# Patient Record
Sex: Female | Born: 1985 | Race: Black or African American | Hispanic: No | Smoking: Never smoker
Health system: Southern US, Community
[De-identification: ages and names within clinical notes are randomized; demographics above are authoritative.]

## PROBLEM LIST (undated history)

## (undated) DIAGNOSIS — F419 Anxiety disorder, unspecified: Secondary | ICD-10-CM

## (undated) DIAGNOSIS — K8020 Calculus of gallbladder without cholecystitis without obstruction: Secondary | ICD-10-CM

## (undated) DIAGNOSIS — E0590 Thyrotoxicosis, unspecified without thyrotoxic crisis or storm: Secondary | ICD-10-CM

## (undated) DIAGNOSIS — T7840XA Allergy, unspecified, initial encounter: Secondary | ICD-10-CM

## (undated) HISTORY — DX: Anxiety disorder, unspecified: F41.9

## (undated) HISTORY — DX: Allergy, unspecified, initial encounter: T78.40XA

## (undated) HISTORY — PX: TONSILLECTOMY: SUR1361

## (undated) HISTORY — DX: Calculus of gallbladder without cholecystitis without obstruction: K80.20

## (undated) HISTORY — PX: OTHER SURGICAL HISTORY: SHX169

## (undated) HISTORY — DX: Thyrotoxicosis, unspecified without thyrotoxic crisis or storm: E05.90

## (undated) HISTORY — PX: ADENOIDECTOMY: SUR15

---
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
## 2005-06-17 ENCOUNTER — Emergency Department (HOSPITAL_COMMUNITY): Admission: EM | Admit: 2005-06-17 | Discharge: 2005-06-18 | Payer: Self-pay | Admitting: Emergency Medicine

## 2005-06-23 ENCOUNTER — Other Ambulatory Visit: Admission: RE | Admit: 2005-06-23 | Discharge: 2005-06-23 | Payer: Self-pay | Admitting: Obstetrics and Gynecology

## 2005-07-12 ENCOUNTER — Inpatient Hospital Stay (HOSPITAL_COMMUNITY): Admission: AD | Admit: 2005-07-12 | Discharge: 2005-07-12 | Payer: Self-pay | Admitting: Obstetrics and Gynecology

## 2005-07-16 ENCOUNTER — Other Ambulatory Visit: Admission: RE | Admit: 2005-07-16 | Discharge: 2005-07-16 | Payer: Self-pay | Admitting: Obstetrics and Gynecology

## 2005-09-24 ENCOUNTER — Inpatient Hospital Stay (HOSPITAL_COMMUNITY): Admission: AD | Admit: 2005-09-24 | Discharge: 2005-09-24 | Payer: Self-pay | Admitting: Obstetrics and Gynecology

## 2005-09-28 ENCOUNTER — Encounter (INDEPENDENT_AMBULATORY_CARE_PROVIDER_SITE_OTHER): Payer: Self-pay | Admitting: Specialist

## 2005-09-28 ENCOUNTER — Inpatient Hospital Stay (HOSPITAL_COMMUNITY): Admission: RE | Admit: 2005-09-28 | Discharge: 2005-10-01 | Payer: Self-pay | Admitting: Obstetrics and Gynecology

## 2005-10-02 ENCOUNTER — Encounter: Admission: RE | Admit: 2005-10-02 | Discharge: 2005-10-16 | Payer: Self-pay | Admitting: Obstetrics and Gynecology

## 2005-11-11 ENCOUNTER — Other Ambulatory Visit: Admission: RE | Admit: 2005-11-11 | Discharge: 2005-11-11 | Payer: Self-pay | Admitting: Obstetrics and Gynecology

## 2006-01-21 ENCOUNTER — Other Ambulatory Visit: Admission: RE | Admit: 2006-01-21 | Discharge: 2006-01-21 | Payer: Self-pay | Admitting: Obstetrics and Gynecology

## 2007-04-24 ENCOUNTER — Inpatient Hospital Stay (HOSPITAL_COMMUNITY): Admission: AD | Admit: 2007-04-24 | Discharge: 2007-04-24 | Payer: Self-pay | Admitting: Obstetrics and Gynecology

## 2007-05-23 ENCOUNTER — Inpatient Hospital Stay (HOSPITAL_COMMUNITY): Admission: AD | Admit: 2007-05-23 | Discharge: 2007-05-24 | Payer: Self-pay | Admitting: Obstetrics & Gynecology

## 2007-09-04 ENCOUNTER — Inpatient Hospital Stay (HOSPITAL_COMMUNITY): Admission: AD | Admit: 2007-09-04 | Discharge: 2007-09-04 | Payer: Self-pay | Admitting: Obstetrics

## 2008-07-07 ENCOUNTER — Emergency Department (HOSPITAL_COMMUNITY): Admission: EM | Admit: 2008-07-07 | Discharge: 2008-07-07 | Payer: Self-pay | Admitting: Emergency Medicine

## 2008-10-07 ENCOUNTER — Emergency Department (HOSPITAL_COMMUNITY): Admission: EM | Admit: 2008-10-07 | Discharge: 2008-10-07 | Payer: Self-pay | Admitting: Emergency Medicine

## 2009-02-21 ENCOUNTER — Emergency Department (HOSPITAL_COMMUNITY): Admission: EM | Admit: 2009-02-21 | Discharge: 2009-02-22 | Payer: Self-pay | Admitting: Emergency Medicine

## 2009-07-19 ENCOUNTER — Emergency Department (HOSPITAL_COMMUNITY): Admission: EM | Admit: 2009-07-19 | Discharge: 2009-07-19 | Payer: Self-pay | Admitting: Emergency Medicine

## 2009-09-12 ENCOUNTER — Emergency Department (HOSPITAL_COMMUNITY): Admission: EM | Admit: 2009-09-12 | Discharge: 2009-09-12 | Payer: Self-pay | Admitting: Emergency Medicine

## 2009-10-29 ENCOUNTER — Emergency Department (HOSPITAL_COMMUNITY): Admission: EM | Admit: 2009-10-29 | Discharge: 2009-10-29 | Payer: Self-pay | Admitting: Emergency Medicine

## 2010-01-28 ENCOUNTER — Ambulatory Visit (HOSPITAL_BASED_OUTPATIENT_CLINIC_OR_DEPARTMENT_OTHER): Admission: RE | Admit: 2010-01-28 | Discharge: 2010-01-28 | Payer: Self-pay | Admitting: Otolaryngology

## 2010-06-14 ENCOUNTER — Emergency Department (HOSPITAL_BASED_OUTPATIENT_CLINIC_OR_DEPARTMENT_OTHER)
Admission: EM | Admit: 2010-06-14 | Discharge: 2010-06-14 | Payer: Self-pay | Source: Home / Self Care | Admitting: Emergency Medicine

## 2010-12-25 LAB — RAPID STREP SCREEN (MED CTR MEBANE ONLY): Streptococcus, Group A Screen (Direct): POSITIVE — AB

## 2010-12-28 LAB — RAPID STREP SCREEN (MED CTR MEBANE ONLY): Streptococcus, Group A Screen (Direct): NEGATIVE

## 2010-12-30 LAB — POCT HEMOGLOBIN-HEMACUE: Hemoglobin: 12.3 g/dL (ref 12.0–15.0)

## 2011-01-15 LAB — RAPID STREP SCREEN (MED CTR MEBANE ONLY): Streptococcus, Group A Screen (Direct): NEGATIVE

## 2011-01-20 LAB — POCT I-STAT, CHEM 8
BUN: 10 mg/dL (ref 6–23)
Calcium, Ion: 1.22 mmol/L (ref 1.12–1.32)
Chloride: 105 mEq/L (ref 96–112)
Creatinine, Ser: 0.9 mg/dL (ref 0.4–1.2)
Glucose, Bld: 79 mg/dL (ref 70–99)
HCT: 40 % (ref 36.0–46.0)
Hemoglobin: 13.6 g/dL (ref 12.0–15.0)
Potassium: 3.9 mEq/L (ref 3.5–5.1)
Sodium: 139 mEq/L (ref 135–145)
TCO2: 27 mmol/L (ref 0–100)

## 2011-01-20 LAB — CBC
HCT: 37 % (ref 36.0–46.0)
Hemoglobin: 12.1 g/dL (ref 12.0–15.0)
MCHC: 32.6 g/dL (ref 30.0–36.0)
MCV: 82.5 fL (ref 78.0–100.0)
Platelets: 361 10*3/uL (ref 150–400)
RBC: 4.49 MIL/uL (ref 3.87–5.11)
RDW: 14.3 % (ref 11.5–15.5)
WBC: 8 10*3/uL (ref 4.0–10.5)

## 2011-01-20 LAB — DIFFERENTIAL
Basophils Absolute: 0 10*3/uL (ref 0.0–0.1)
Basophils Relative: 1 % (ref 0–1)
Eosinophils Absolute: 0.1 10*3/uL (ref 0.0–0.7)
Eosinophils Relative: 1 % (ref 0–5)
Lymphocytes Relative: 48 % — ABNORMAL HIGH (ref 12–46)
Lymphs Abs: 3.8 10*3/uL (ref 0.7–4.0)
Monocytes Absolute: 0.6 10*3/uL (ref 0.1–1.0)
Monocytes Relative: 8 % (ref 3–12)
Neutro Abs: 3.4 10*3/uL (ref 1.7–7.7)
Neutrophils Relative %: 43 % (ref 43–77)

## 2011-01-20 LAB — POCT CARDIAC MARKERS
CKMB, poc: <1 ng/mL — ABNORMAL LOW (ref 1.0–8.0)
CKMB, poc: <1 ng/mL — ABNORMAL LOW (ref 1.0–8.0)
Myoglobin, poc: 31 ng/mL (ref 12–200)
Myoglobin, poc: 40.3 ng/mL (ref 12–200)
Troponin i, poc: <0.05 ng/mL (ref 0.00–0.09)
Troponin i, poc: <0.05 ng/mL (ref 0.00–0.09)

## 2011-01-20 LAB — URINALYSIS, ROUTINE W REFLEX MICROSCOPIC
Bilirubin Urine: NEGATIVE
Glucose, UA: NEGATIVE mg/dL
Hgb urine dipstick: NEGATIVE
Ketones, ur: NEGATIVE mg/dL
Nitrite: NEGATIVE
Protein, ur: NEGATIVE mg/dL
Specific Gravity, Urine: 1.025 (ref 1.005–1.030)
Urobilinogen, UA: 0.2 mg/dL (ref 0.0–1.0)
pH: 6 (ref 5.0–8.0)

## 2011-01-20 LAB — D-DIMER, QUANTITATIVE: D-Dimer, Quant: 0.28 ug/mL-FEU (ref 0.00–0.48)

## 2011-01-20 LAB — POCT PREGNANCY, URINE: Preg Test, Ur: NEGATIVE

## 2011-02-27 NOTE — H&P (Signed)
NAMESHAMIA, Moore              ACCOUNT NO.:  [id number]   MEDICAL RECORD NO.:  [id number]          PATIENT TYPE:  INP   LOCATION:  9304                          FACILITY:  [facility]   PHYSICIAN:  Judith Fat. Moore, M.D. DATE OF BIRTH:  1986/09/01   DATE OF ADMISSION:  09/28/2005  DATE OF DISCHARGE:                                HISTORY & PHYSICAL   HISTORY OF PRESENT ILLNESS:  Judith Moore is a 25 year old gravida 1, para 0,  at 71 and 3/7ths weeks, EDD December 25, 2005, who presents from ultrasound  today with fetal heart rate bradycardia, oligohydramnios, and an abnormal  Doppler study showing absent and reverse end diastolic flow. She presented  on September 24, 2005 to the office of [facility] with a report of decreased fetal  movement.  Ultrasound done on that day showed oligohydramnios at 8.1 cm.  The patient remained on bedrest over the weekend and reported increased  fetal movement over the weekend and was scheduled for repeat ultrasound  again today.  She reports positive fetal movement. No vaginal bleeding, no  rupture of membranes and denies any headache, visual changes or epigastric  pain. She does not report any contractions, cramping or pain.   Her pregnancy has been followed by the C.N.M. service at [facility] and is  remarkable for -  1.  History of irritable bowel syndrome.  2.  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  3.  Rubella equivocal.  4.  Abnormal Pap smear.  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  5.  Oligohydramnios.   PRENATAL CARE:  This patient's prenatal history has been essentially  unremarkable. She began prenatal care at the office of [facility] on May 28, 2005, and her pregnancy has remained unremarkable until this past Thursday,  September 24, 2005.   PRENATAL LABORATORIES:  Prenatal lab work on June 23, 2005 revealed  hemoglobin/hematocrit 11.4 and 35.3, platelets 370,000. Blood type and Rh  A+, antibody screen negative, sickle cell trait negative, VDRL  nonreactive,  rubella equivocal. Hepatitis B surface antigen negative.  Pap smear low-  grade SIL. GC and chlamydia negative. CF testing positive.  Fetal  fibronectin testing on September 24, 2005 was negative.   OBSTETRICAL HISTORY:  The current pregnancy.   ALLERGIES:  The patient has no known drug allergies.   SOCIAL HISTORY:  She denies the use of tobacco, alcohol or illicit drugs.   MEDICAL HISTORY:  Significant for irritable bowel syndrome and positive CF  carrier.   FAMILY HISTORY:  Maternal grandmother with chronic hypertension on  medications.  Maternal grandmother has DVT. The patient's brother had a  seizure disorder as a child which has resolved.  The patient's father is  alcoholic.   GENETIC HISTORY:  Father of the baby's brother has a hole in his heart.  Mother of the baby's paternal aunt has sickle cell trait. Otherwise  unremarkable.   SOCIAL HISTORY:  Judith Moore is a 25 year old married African-American  female. Her husband, Judith Moore, is involved and supportive. They do  not subscribed to a religious faith.   REVIEW OF  SYSTEMS:  As described above. The patient is at 27 weeks 3 days  with abnormal ultrasound studies today showing oligohydramnios, fetal heart  rate bradycardia and abnormal UA Dopplers with absent and reversed end  diastolic flow.   PHYSICAL EXAMINATION:  VITAL SIGNS:  Stable. The patient is afebrile. Fetal  heart rate at the moment is 160 to 170s with tachycardia, positive  variability with variable decelerations initially on admission to MAU.  Fetal heart rate was in the 60s for a prolonged period of time. There were  no contractions on the monitor.  Ultrasound examination shows single IUP at  27 weeks 3 days in the oblique position, placenta posterior grade 1, no  previa.  Estimated fetal weight 771-826 grams at the 25th to 50th  percentile. AFI 6.8; previously measured on September 26, 2005 at 8.1 cm.  Cervix 3.8 cm transabdominally.  UA Doppler 5.8 SD, normal is less than 4.76,  with an absent and reversed end diastolic flow.  BPP today is 6/8.  BPP on  September 26, 2005 was 8/8.   ASSESSMENT:  Intrauterine pregnancy at 27-3/7 weeks, fetal heart rate  bradycardia, oligohydramnios and abnormal Doppler study showing absent and  reversed end diastolic flow.   PLAN:  The decision was made with the patient and Dr. Dois Davenport Moore to  proceed with this cesarean delivery. See orders as written.      Judith Moore, C.N.M.      Judith Moore, M.D.  Electronically Signed    SDM/MEDQ  D:  09/28/2005  T:  09/29/2005  Job:  [id number]

## 2011-02-27 NOTE — Op Note (Signed)
Judith Moore, Judith Moore              ACCOUNT NO.:  [id number]   MEDICAL RECORD NO.:  [id number]          PATIENT TYPE:  INP   LOCATION:  9304                          FACILITY:  [facility]   PHYSICIAN:  Crist Fat. Rivard, M.D. DATE OF BIRTH:  1986-08-25   DATE OF PROCEDURE:  09/28/2005  DATE OF DISCHARGE:                                 OPERATIVE REPORT   PREOPERATIVE DIAGNOSIS:  Intrauterine pregnancy at 27 weeks and 3 days with  nonreassuring fetal heart rate, oligohydramnios, and reversed end-diastolic  flow on Doppler studies.   POSTOPERATIVE DIAGNOSIS:  Intrauterine pregnancy at 27 weeks and 3 days with  nonreassuring fetal heart rate, oligohydramnios, and reversed end-diastolic  flow on Doppler studies.   ANESTHESIA:  Spinal by Germaine Pomfret, M.D.   PROCEDURE:  Primary low transverse cesarean section.   SURGEON:  Dr. Estanislado Pandy.   ASSISTANT:  [name], C.N.M.   ESTIMATED BLOOD LOSS:  400 mL.   HISTORY OF PRESENT ILLNESS:  This is a 25 year old married African-American  female gravida 1, para 0 at 43 weeks and 3 days with an estimated delivery  date of December 25, 2005 who presented today at the ultrasound department for  a recheck of oligohydramnios which was diagnosed 12/14 after the patient  complained of decreased fetal movement. The patient was sent home on bed  rest and increase p.o. intake and was to have a repeat ultrasound today. I  was called by ultrasonographer reporting that during the ultrasound the baby  had multiple episodes of prolonged bradycardia with fetal heart rate ranging  in the 60 beats per minute. In between heart rate was around 150 and a  sonographer was able to perform Doppler study during that episode of normal  heart rate which revealed an SD ratio elevated at 5.8 and absent and  reversed end-diastolic flow. The oligohydramnios has worsened with an AFI  now of 6.8, which was 8.1 two days ago and the baby was appropriately grown  with an  estimated fetal weight of around 800 grams putting him at 25th to  50th percentile. The patient was rapidly transferred to maternity admission  where upon arrival fetal heart rate was bradycardic at 60 beats per minute  for multiple minutes. After the monitor was readjusted we then saw a  compensatory tachycardia with fetal heart rate in the 165-175 with multiple  variable decelerations. While we were evaluating fetal well-being and  explaining the option of primary C-section to the patient, fetal heart rate  dropped to 100 beats per minute for 4 minutes. Decision was made to proceed  with a stat C-section. The patient was consented and agreed to the  procedure.   PROCEDURE:  After being informed of the planned procedure with possible  complications including bleeding, infection and injury to other organs as  well as hospital stay and recovery, informed consent was obtained. The  patient was taken to cesarean suite and given spinal anesthesia without  complication. She was placed in a dorsal decubitus position.  Pelvis tilted  to the left. Prepped and draped in a sterile fashion and a Foley  catheter  was inserted in the bladder. After assessing adequate level of anesthesia,  we infiltrated the suprapubic area with 20 mL of Marcaine 0.25 and performed  a Pfannenstiel incision which was brought down sharply to the fascia. Fascia  was incised in a low transverse fashion. Linea alba was dissected.  Peritoneum was entered bluntly. Visceroperitoneum was entered in a low  transverse fashion allowing [place] to safely retract bladder by developing a  bladder flap. Myometrium was then entered in a low transverse fashion first  with knife and extended bluntly. Amniotic fluid was clear. We assisted the  birth of a female infant in vertex presentation at 1:19 p.m. Mouth and nose  were suctioned. Cord was clamped with two Kelly clamps and sectioned and the  baby was given to the neonatologist present in the  adjacent room. A cord pH  was taken from umbilical artery and 20 cc was taken from the umbilical vein.  Placenta was removed manually. It was complete. Cord had three vessels and  the placenta was sent to pathology. Uterine revision was negative. We  proceeded with closure of the myometrium in two layers first with a running  lock suture of 0 Vicryl then with a Lembert suture of 0 Vicryl imbricating  the first one. Hemostasis was assessed and adequate. Both paracolic gutters  were cleaned. Both tubes and ovaries assessed and normal. Pelvis was  profusely irrigated with warm saline. Hemostasis was deemed adequate.   Under fascia hemostasis was completed with cautery and the fascia was closed  with two running sutures of 1 Vicryl meeting midline. The wound was  irrigated with warm saline. Hemostasis is completed with cautery and skin  was closed with subcuticular suture of 3-0 Monocryl and Steri-Strips.   Instruments and sponge count was complete x2. Estimated blood loss was 400  mL. Procedure was very well tolerated by the patient who is taken to  recovery room in a well and stable condition. A little boy named Jeri Modena  was born at 1:19 p.m., received an Apgar of 7 at 1 minute and 7 at 9  minutes, 9 at 5 minutes and was taken to the NICU.      [name], M.D.  Electronically Signed     SAR/MEDQ  D:  09/28/2005  T:  09/29/2005  Job:  [id number]

## 2011-02-27 NOTE — Discharge Summary (Signed)
NAMELAVITA, PONTIUS              ACCOUNT NO.:  [id number]   MEDICAL RECORD NO.:  [id number]          PATIENT TYPE:  INP   LOCATION:  9304                          [facility]  [facility]   PHYSICIAN:  Hal Morales, M.D.DATE OF BIRTH:  [birth year]   DATE OF ADMISSION:  09/28/2005  DATE OF DISCHARGE:  10/01/2005                                 DISCHARGE SUMMARY   ADMITTING DIAGNOSES:  1.  Intrauterine pregnancy at 59 and 3/7 weeks.  2.  Fetal bradycardia.  3.  Oligohydramnios.  4.  Absent end-diastolic flow on Doppler testing.   DISCHARGE DIAGNOSES:  1.  Intrauterine pregnancy at 68 and 3/7 weeks.  2.  Non-reassuring fetal heart rate.  3.  Oligohydramnios.  4.  Reversed end-diastolic flow on Doppler studies.   PROCEDURES:  1.  Stat primary low-transverse cesarean section.  2.  Spinal anesthesia.   HOSPITAL COURSE:  Ms. Hope is a 25 year old gravida 1, para 0 at 48 and  3/7 weeks, who had an ultrasound at [facility] on September 28, 2005.  At that time, this was done for evaluation of fluid.  During the ultrasound,  there was episode of brachycardia with oligohydramnios and abnormal Doppler  studies showing absent and reversed end-diastolic flow noted.  She was  consented for emergent cesarean section by Dr. Estanislado Pandy, and this was  performed on September 28, 2005.  Her pregnancy had been remarkable for:  1.  History of IBS.  2.  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  3.  Rubella equivalent.  4.  Abnormal Pap, colpo with positive high-risk HPV.  5.  Oligohydramnios.   The patient was taken to the operating room where the urgent C-section was  done under spinal anesthesia.  Findings were a viable female.  Apgars 7 and 9.  Weight was 769 g, 1 pound 11 ounces.  Infant was taken to the NICU.  Mother  was taken to recovery in good condition.  There was no clear rational for  the oligohydramnios or fetal bradycardia.  The placenta was sent to  pathology.  During the recovery phase,  patient continued to do well.  By the  next day, she was up ad lib tolerating a regular diet.  Infant was overall  doing well in NICU although had a spontaneous pneumothorax and required  placement of ventilator although over the next few days the ventilator  settings were decreased significantly.  By postop day 1, the patient's  hemoglobin was 9.1 down from 10.3.  The rest of her hospital course was  uncomplicated.  By postop day 3, the patient was doing well.  She was up ad  lib.  She was tolerating a regular diet.  She had good pain control on oral  pain medication.  Infant was doing well, still ventilated in the NICU, but  settings were continuing to diminish.  The patient was noted to have a  clean, dry and intact incision with subcuticular sutures and Steri-Strips  noted.  Her lochia was normal and her vital signs were stable.  She was  pumping for breast milk.  She was undecided about contraception.  She was  deemed to have received the full benefit of her hospital stay and was  discharged home.   DISCHARGE CONDITION:  Stable.   DISCHARGE INSTRUCTIONS:  Per [facility] handout.   DISCHARGE MEDICATIONS:  1.  Motrin 600 mg p.o. q. 6 h. p.r.n. pain.  2.  Tylox one to two p.o. q.3 to 4 h. p.r.n. pain.   Patient was going to obtain a pump to facilitate breastfeeding.  Support was  offered for the NICU infant.   DISCHARGE FOLLOWUP:  To occur in six weeks at [facility], or p.r.n.      Renaldo Reel Emilee Hero, C.N.M.      Hal Morales, M.D.  Electronically Signed    VLL/MEDQ  D:  10/01/2005  T:  10/01/2005  Job:  [id number]

## 2011-07-27 LAB — URINALYSIS, ROUTINE W REFLEX MICROSCOPIC
Glucose, UA: NEGATIVE
Ketones, ur: >80 — AB
Leukocytes, UA: NEGATIVE
Nitrite: POSITIVE — AB
Protein, ur: 100 — AB
Specific Gravity, Urine: >1.03 — ABNORMAL HIGH
Urobilinogen, UA: 1
pH: 6

## 2011-07-27 LAB — COMPREHENSIVE METABOLIC PANEL
ALT: 150 — ABNORMAL HIGH
AST: 78 — ABNORMAL HIGH
Albumin: 3.4 — ABNORMAL LOW
Alkaline Phosphatase: 74
GFR calc Af Amer: >60
Potassium: 3.7
Sodium: 133 — ABNORMAL LOW
Total Protein: 7.6

## 2011-07-27 LAB — URINE CULTURE

## 2011-07-27 LAB — POCT PREGNANCY, URINE
Operator id: 27537
Preg Test, Ur: POSITIVE

## 2011-07-27 LAB — URINE MICROSCOPIC-ADD ON

## 2011-07-27 LAB — CBC
Hemoglobin: 12.2
Platelets: 383
RDW: 12.5

## 2011-07-28 LAB — GC/CHLAMYDIA PROBE AMP, GENITAL: GC Probe Amp, Genital: NEGATIVE

## 2011-07-28 LAB — URINALYSIS, ROUTINE W REFLEX MICROSCOPIC
Ketones, ur: NEGATIVE
Protein, ur: NEGATIVE
Urobilinogen, UA: 0.2

## 2011-07-28 LAB — CBC
MCV: 81
Platelets: 398
RDW: 14.5 — ABNORMAL HIGH
WBC: 9.3

## 2011-07-28 LAB — ABO/RH: ABO/RH(D): A POS

## 2011-07-28 LAB — WET PREP, GENITAL: Clue Cells Wet Prep HPF POC: NONE SEEN

## 2011-07-28 LAB — HCG, QUANTITATIVE, PREGNANCY: hCG, Beta Chain, Quant, S: 16671 — ABNORMAL HIGH

## 2011-07-28 LAB — POCT PREGNANCY, URINE: Preg Test, Ur: POSITIVE

## 2011-11-04 ENCOUNTER — Ambulatory Visit (INDEPENDENT_AMBULATORY_CARE_PROVIDER_SITE_OTHER): Payer: 59

## 2011-11-04 DIAGNOSIS — H6500 Acute serous otitis media, unspecified ear: Secondary | ICD-10-CM

## 2011-11-04 DIAGNOSIS — H6980 Other specified disorders of Eustachian tube, unspecified ear: Secondary | ICD-10-CM

## 2011-11-04 DIAGNOSIS — H9209 Otalgia, unspecified ear: Secondary | ICD-10-CM

## 2011-12-28 ENCOUNTER — Encounter (HOSPITAL_COMMUNITY): Payer: Self-pay | Admitting: *Deleted

## 2011-12-28 ENCOUNTER — Emergency Department (HOSPITAL_COMMUNITY)
Admission: EM | Admit: 2011-12-28 | Discharge: 2011-12-28 | Disposition: A | Discharge status: Home / Self Care | Payer: 59 | Attending: Emergency Medicine | Admitting: Emergency Medicine

## 2011-12-28 DIAGNOSIS — K589 Irritable bowel syndrome without diarrhea: Secondary | ICD-10-CM | POA: Insufficient documentation

## 2011-12-28 LAB — URINALYSIS, ROUTINE W REFLEX MICROSCOPIC
Hgb urine dipstick: NEGATIVE
Leukocytes, UA: NEGATIVE
Specific Gravity, Urine: 1.023 (ref 1.005–1.030)
Urobilinogen, UA: 1 mg/dL (ref 0.0–1.0)

## 2011-12-28 LAB — PREGNANCY, URINE: Preg Test, Ur: NEGATIVE

## 2011-12-28 MED ORDER — DICYCLOMINE HCL 20 MG PO TABS: 20.0000 mg | ORAL_TABLET | Freq: Two times a day (BID) | ORAL | Status: DC

## 2011-12-28 MED ORDER — DOCUSATE SODIUM 100 MG PO CAPS: 100.0000 mg | ORAL_CAPSULE | Freq: Two times a day (BID) | ORAL | Status: AC

## 2011-12-28 NOTE — ED Provider Notes (Signed)
History     CSN: [id number]  Arrival date & time 12/28/11  1216   First MD Initiated Contact with Patient 12/28/11 1318      Chief Complaint  Patient presents with  . Abdominal Pain    nausea    (Consider location/radiation/quality/duration/timing/severity/associated sxs/prior treatment) Patient is a 26 y.o. female presenting with abdominal pain. The history is provided by the patient.  Abdominal Pain The primary symptoms of the illness include abdominal pain.   patient here with lower abdominal cramping x2 weeks. Symptoms are worse after she passes gas. Denies any fever or diarrhea. No vaginal bleeding or urinary symptoms. Symptoms are also worse with certain foods. She has a history of  IBS and does not take any medications for this.  History reviewed. No pertinent past medical history.  Past Surgical History  Procedure Date  . Cesarean section   . Tonsillectomy   . Adenoidectomy     No family history on file.  History  Substance Use Topics  . Smoking status: Never Smoker   . Smokeless tobacco: Not on file  . Alcohol Use: No    OB History    Grav Para Term Preterm Abortions TAB SAB Ect Mult Living                  Review of Systems  Gastrointestinal: Positive for abdominal pain.  All other systems reviewed and are negative.    Allergies  Review of patient's allergies indicates no known allergies.  Home Medications  No current outpatient prescriptions on file.  BP 150/90  Pulse 98  Temp(Src) 98.8 F (37.1 C) (Oral)  Resp 20  Ht 5\' 6"  (1.676 m)  Wt 270 lb (122.471 kg)  BMI 43.58 kg/m2  SpO2 100%  LMP 12/07/2011  Physical Exam  Nursing note and vitals reviewed. Constitutional: She is oriented to person, place, and time. She appears well-developed and well-nourished.  Non-toxic appearance. No distress.  HENT:  Head: Normocephalic and atraumatic.  Eyes: Conjunctivae, EOM and lids are normal. Pupils are equal, round, and reactive to light.  Neck:  Normal range of motion. Neck supple. No tracheal deviation present. No mass present.  Cardiovascular: Normal rate, regular rhythm and normal heart sounds.  Exam reveals no gallop.   No murmur heard. Pulmonary/Chest: Effort normal and breath sounds normal. No stridor. No respiratory distress. She has no decreased breath sounds. She has no wheezes. She has no rhonchi. She has no rales.  Abdominal: Soft. Normal appearance and bowel sounds are normal. She exhibits no distension. There is no tenderness. There is no rebound and no CVA tenderness.  Musculoskeletal: Normal range of motion. She exhibits no edema and no tenderness.  Neurological: She is alert and oriented to person, place, and time. She has normal strength. No cranial nerve deficit or sensory deficit. GCS eye subscore is 4. GCS verbal subscore is 5. GCS motor subscore is 6.  Skin: Skin is warm and dry. No abrasion and no rash noted.  Psychiatric: She has a normal mood and affect. Her speech is normal and behavior is normal.    ED Course  Procedures (including critical care time)   Labs Reviewed  URINALYSIS, ROUTINE W REFLEX MICROSCOPIC  PREGNANCY, URINE   No results found.   No diagnosis found.    MDM  Patient given medications for treatment of IBS. Patient abdomen is nonsurgical at this time. She was given a referral to primary care Dr.        Toy Baker,  MD 12/28/11 1331

## 2011-12-28 NOTE — Discharge Instructions (Signed)
Irritable Bowel Syndrome Irritable Bowel Syndrome (IBS) is caused by a disturbance of normal bowel function. Other terms used are spastic colon, mucous colitis, and irritable colon. It does not require surgery, nor does it lead to cancer. There is no cure for IBS. But with proper diet, stress reduction, and medication, you will find that your problems (symptoms) will gradually disappear or improve. IBS is a common digestive disorder. It usually appears in late adolescence or early adulthood. Women develop it twice as often as men. CAUSES   After food has been digested and absorbed in the small intestine, waste material is moved into the colon (large intestine). In the colon, water and salts are absorbed from the undigested products coming from the small intestine. The remaining residue, or fecal material, is held for elimination. Under normal circumstances, gentle, rhythmic contractions on the bowel walls push the fecal material along the colon towards the rectum. In IBS, however, these contractions are irregular and poorly coordinated. The fecal material is either retained too long, resulting in constipation, or expelled too soon, producing diarrhea. SYMPTOMS   The most common symptom of IBS is pain. It is typically in the lower left side of the belly (abdomen). But it may occur anywhere in the abdomen. It can be felt as heartburn, backache, or even as a dull pain in the arms or shoulders. The pain comes from excessive bowel-muscle spasms and from the buildup of gas and fecal material in the colon. This pain:  Can range from sharp belly (abdominal) cramps to a dull, continuous ache.   Usually worsens soon after eating.   Is typically relieved by having a bowel movement or passing gas.  Abdominal pain is usually accompanied by constipation. But it may also produce diarrhea. The diarrhea typically occurs right after a meal or upon arising in the morning. The stools are typically soft and watery. They are  often flecked with secretions (mucus). Other symptoms of IBS include:  Bloating.   Loss of appetite.   Heartburn.   Feeling sick to your stomach (nausea).   Belching   Vomiting   Gas.  IBS may also cause a number of symptoms that are unrelated to the digestive system:  Fatigue.   Headaches.   Anxiety   Shortness of breath   Difficulty in concentrating.   Dizziness.  These symptoms tend to come and go. DIAGNOSIS   The symptoms of IBS closely mimic the symptoms of other, more serious digestive disorders. So your caregiver may wish to perform a variety of additional tests to exclude these disorders. He/she wants to be certain of learning what is wrong (diagnosis). The nature and purpose of each test will be explained to you. TREATMENT A number of medications are available to help correct bowel function and/or relieve bowel spasms and abdominal pain. Among the drugs available are:  Mild, non-irritating laxatives for severe constipation and to help restore normal bowel habits.   Specific anti-diarrheal medications to treat severe or prolonged diarrhea.   Anti-spasmodic agents to relieve intestinal cramps.   Your caregiver may also decide to treat you with a mild tranquilizer or sedative during unusually stressful periods in your life.  The important thing to remember is that if any drug is prescribed for you, make sure that you take it exactly as directed. Make sure that your caregiver knows how well it worked for you. HOME CARE INSTRUCTIONS    Avoid foods that are high in fat or oils. Some examples are:heavy cream, butter, frankfurters,   sausage, and other fatty meats.   Avoid foods that have a laxative effect, such as fruit, fruit juice, and dairy products.   Cut out carbonated drinks, chewing gum, and "gassy" foods, such as beans and cabbage. This may help relieve bloating and belching.   Bran taken with plenty of liquids may help relieve constipation.   Keep track  of what foods seem to trigger your symptoms.   Avoid emotionally charged situations or circumstances that produce anxiety.   Start or continue exercising.   Get plenty of rest and sleep.  MAKE SURE YOU:    Understand these instructions.   Will watch your condition.   Will get help right away if you are not doing well or get worse.  Document Released: 09/28/2005 Document Revised: 09/17/2011 Document Reviewed: 05/18/2008 [employer] Patient Information 2012 [employer]. 

## 2011-12-28 NOTE — ED Notes (Signed)
Pt reports intermittent abd pain x 2 weeks, started to have nausea today.  Denies any vomiting.  Pt reports LBM was x 4 days ago.  Has tried gas-x without relief.  Pt reports pain in her abd is radiating down to her rectum

## 2012-03-30 ENCOUNTER — Ambulatory Visit (INDEPENDENT_AMBULATORY_CARE_PROVIDER_SITE_OTHER): Payer: 59 | Admitting: Family Medicine

## 2012-03-30 VITALS — BP 122/83 | HR 82 | Temp 98.9°F | Resp 18 | Ht 66.75 in

## 2012-03-30 DIAGNOSIS — M629 Disorder of muscle, unspecified: Secondary | ICD-10-CM

## 2012-03-30 DIAGNOSIS — L509 Urticaria, unspecified: Secondary | ICD-10-CM

## 2012-03-30 DIAGNOSIS — T7840XA Allergy, unspecified, initial encounter: Secondary | ICD-10-CM

## 2012-03-30 MED ORDER — DIPHENHYDRAMINE HCL 25 MG PO CAPS
25.0000 mg | ORAL_CAPSULE | Freq: Four times a day (QID) | ORAL | Status: DC | PRN
  Administered 2012-03-30: 25 mg via ORAL

## 2012-03-30 MED ORDER — PREDNISONE 20 MG PO TABS: ORAL_TABLET | ORAL | Status: AC

## 2012-03-30 MED ORDER — METHYLPREDNISOLONE SODIUM SUCC 125 MG IJ SOLR
125.0000 mg | Freq: Once | INTRAMUSCULAR | Status: AC
  Administered 2012-03-30: 125 mg via INTRAMUSCULAR

## 2012-03-30 MED ORDER — RANITIDINE HCL 150 MG PO TABS: 150.0000 mg | ORAL_TABLET | Freq: Two times a day (BID) | ORAL | Status: DC

## 2012-03-30 MED ORDER — DIPHENHYDRAMINE HCL 25 MG PO CAPS
50.0000 mg | ORAL_CAPSULE | Freq: Once | ORAL | Status: AC
  Administered 2012-03-30: 50 mg via ORAL

## 2012-03-30 MED ORDER — EPINEPHRINE 0.3 MG/0.3ML IJ DEVI: 0.3000 mg | Freq: Once | INTRAMUSCULAR | Status: DC

## 2012-03-30 MED ORDER — DIPHENHYDRAMINE HCL 25 MG PO CAPS: 25.0000 mg | ORAL_CAPSULE | Freq: Four times a day (QID) | ORAL | Status: DC | PRN

## 2012-03-30 NOTE — Patient Instructions (Addendum)
Take prednisone tapered course as directed. Zyrtec one daily Ranitidine 150 mg twice daily Use the EpiPen in the case of a possible allergic emergency.  Hives Hives (urticaria) are itchy, red, swollen patches on the skin. They may change size, shape, and location quickly and repeatedly. Hives that occur deeper in the skin can cause swelling of the hands, feet, and face. Hives may be an allergic reaction to something you or your child ate, touched, or put on the skin. Hives can also be a reaction to cold, heat, viral infections, medication, insect bites, or emotional stress. Often the cause is hard to find. Hives can come and go for several days to several weeks. Hives are not contagious. HOME CARE INSTRUCTIONS   If the cause of the hives is known, avoid exposure to that source.   To relieve itching and rash:   Apply cold compresses to the skin or take cool water baths. Do not take or give your child hot baths or showers because the warmth will make the itching worse.   The best medicine for hives is an antihistamine. An antihistamine will not cure hives, but it will reduce their severity. You can use an antihistamine available over the counter. This medicine may make your child sleepy. Teenagers should not drive while using this medicine.   Take or give an antihistamine every 6 hours until the hives are completely gone for 24 hours or as directed.   Your child may have other medications prescribed for itching. Give these as directed by your child's caregiver.   You or your child should wear loose fitting clothing, including undergarments. Skin irritations may make hives worse.   Follow-up as directed by your caregiver.  SEEK MEDICAL CARE IF:   You or your child still have considerable itching after taking the medication (prescribed or purchased over the counter).   Joint swelling or pain occurs.  SEEK IMMEDIATE MEDICAL CARE IF:   You have a fever.   Swollen lips or tongue are  noticed.   There is difficulty with breathing, swallowing, or tightness in the throat or chest.   Abdominal pain develops.   Your child starts acting very sick.  These may be the first signs of a life-threatening allergic reaction. THIS IS AN EMERGENCY. Call 911 for medical help. MAKE SURE YOU:   Understand these instructions.   Will watch your condition.   Will get help right away if you are not doing well or get worse.  Document Released: 09/28/2005 Document Revised: 09/17/2011 Document Reviewed: 05/18/2008 [facility] Patient Information 2012 [place], [place].

## 2012-03-30 NOTE — Progress Notes (Signed)
26 year old lady who presents with a history of allergic reaction in her face and neck. She did not notice any yesterday. She noticed it when she woke up this morning she had a rash on her neck and her face was puffy. She felt a little tight in her throat. She does not have any frank difficulty breathing, but felt like it might affect her some. She took some Benadryl and came over here. She does not know of anything differently she is eating the last few days. She had tripped Sunday, but its usual for her to eat some shrimp. She has not taken any medications. She has never had hives before.  Objective: Overweight Afro-American female no acute distress. Face does appear puffy. Obvious hives on her neck. Throat is clear. Tongue is not swollen at all. Neck is supple. She has urticaria on her neck. The rest of her trunk and extremities do not seem to have any rash on them.  Assessment Urticaria and acute allergic reaction etiology undetermined  Plan:  Solu-Medrol, Benadryl, ranitidine.  I think she can safely go home on medication. Cautioned to call the EMS if acutely worse. We'll keep her on the above medicines also.

## 2012-09-12 ENCOUNTER — Ambulatory Visit (INDEPENDENT_AMBULATORY_CARE_PROVIDER_SITE_OTHER): Payer: 59 | Admitting: Physician Assistant

## 2012-09-12 VITALS — BP 118/80 | HR 124 | Temp 102.0°F | Resp 18 | Ht 68.0 in | Wt 271.0 lb

## 2012-09-12 DIAGNOSIS — R05 Cough: Secondary | ICD-10-CM

## 2012-09-12 DIAGNOSIS — M791 Myalgia, unspecified site: Secondary | ICD-10-CM

## 2012-09-12 DIAGNOSIS — R51 Headache: Secondary | ICD-10-CM

## 2012-09-12 DIAGNOSIS — IMO0001 Reserved for inherently not codable concepts without codable children: Secondary | ICD-10-CM

## 2012-09-12 DIAGNOSIS — R509 Fever, unspecified: Secondary | ICD-10-CM

## 2012-09-12 LAB — POCT CBC
Granulocyte percent: 74.4 %G (ref 37–80)
HCT, POC: 39.5 % (ref 37.7–47.9)
Hemoglobin: 12.1 g/dL — AB (ref 12.2–16.2)
Lymph, poc: 1.4 (ref 0.6–3.4)
MPV: 8.5 fL (ref 0–99.8)
POC Granulocyte: 6 (ref 2–6.9)
POC MID %: 8.7 %M (ref 0–12)
RBC: 4.6 M/uL (ref 4.04–5.48)

## 2012-09-12 LAB — POCT INFLUENZA A/B: Influenza A, POC: NEGATIVE

## 2012-09-12 MED ORDER — HYDROCOD POLST-CHLORPHEN POLST 10-8 MG/5ML PO LQCR: 5.0000 mL | Freq: Two times a day (BID) | ORAL | Status: DC | PRN

## 2012-09-12 NOTE — Patient Instructions (Signed)
Get plenty of rest and drink at least 64 ounces of water daily. 

## 2012-09-12 NOTE — Progress Notes (Signed)
Subjective:    Patient ID: Judith Moore, female    DOB: Feb 21, 1986, 26 y.o.   MRN: [id number]  HPI  This 26 y.o. female presents for evaluation of "I think I have the flu."  Sudden onset of muscle aches, HA, fatigue, cough (non-productive), nausea, diarrhea.  No congestion, sore throat. Subjective fever/chills.  No flu vaccine this season. She is accompanied by her son, Jeri Modena, today, who has a cough and nasal congestion.  He and his younger brother have had flu vaccine this season.   History reviewed. No pertinent past medical history.  Past Surgical History  Procedure Date  . Tonsillectomy   . Adenoidectomy   . Cesarean section 09/28/2005, 11/28/2007    Prior to Admission medications   Not on File    No Known Allergies  History   Social History  . Marital Status: Married    Spouse Name: SEPARATED    Number of Children: 2  . Years of Education: 15+   Occupational History  . 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 Smoking status: Never Smoker   . Smokeless tobacco: Never Used  . Alcohol Use: No  . Drug Use: No  . Sexually Active: Yes -- Female partner(s)    Birth Control/ Protection: IUD     Comment: Mirena   Other Topics Concern  . Not on file   Social History Narrative   Lives with her two sons, her uncle and cousin. Separated from husband 11/2010.    History reviewed. No pertinent family history.  Review of Systems As above.  No urinary symptoms. No rash.    Objective:   Physical Exam Blood pressure 118/80, pulse 124, temperature 102 F (38.9 C), temperature source Oral, resp. rate 18, height 5\' 8"  (1.727 m), weight 271 lb (122.925 kg), last menstrual period 09/05/2012, SpO2 99.00%. Body mass index is 41.21 kg/(m^2). Well-developed, well nourished obese BF who is awake, alert and oriented, in NAD. HEENT: [place]/AT, PERRL, EOMI.  Sclera and conjunctiva are clear.  EAC are patent, TMs are normal in appearance. Nasal mucosa  is pink and moist. OP is clear. Neck: supple, non-tender, no lymphadenopathy, thyromegaly. Heart: RRR, no murmur Lungs: normal effort, CTA Abdomen: normo-active bowel sounds, supple, non-tender, no mass or organomegaly. Extremities: no cyanosis, clubbing or edema. Skin: warm and dry without rash. Psychologic: good mood and appropriate affect, normal speech and behavior.  Results for orders placed in visit on 09/12/12  POCT INFLUENZA A/B      Component Value Range   Influenza A, POC Negative     Influenza B, POC Negative    POCT CBC      Component Value Range   WBC 8.0  4.6 - 10.2 K/uL   Lymph, poc 1.4  0.6 - 3.4   POC LYMPH PERCENT 16.9  10 - 50 %L   MID (cbc) 0.7  0 - 0.9   POC MID % 8.7  0 - 12 %M   POC Granulocyte 6.0  2 - 6.9   Granulocyte percent 74.4  37 - 80 %G   RBC 4.60  4.04 - 5.48 M/uL   Hemoglobin 12.1 (*) 12.2 - 16.2 g/dL   HCT, POC 13.0  86.5 - 47.9 %   MCV 85.8  80 - 97 fL   MCH, POC 26.3 (*) 27 - 31.2 pg   MCHC 30.6 (*) 31.8 - 35.4 g/dL   RDW, POC 78.4     Platelet Count, POC 343  142 - 424 K/uL   MPV 8.5  0 - 99.8 fL       Assessment & Plan:   1. Fever  POCT Influenza A/B, POCT CBC  2. Pain in the muscles    3. HA (headache)    4. Influenza-like illness    5. Cough  chlorpheniramine-HYDROcodone (TUSSIONEX PENNKINETIC ER) 10-8 MG/5ML [facility]   Supportive care, RTC if symptoms worsen/persist.

## 2013-02-03 ENCOUNTER — Ambulatory Visit (INDEPENDENT_AMBULATORY_CARE_PROVIDER_SITE_OTHER): Payer: 59 | Admitting: Physician Assistant

## 2013-02-03 VITALS — BP 137/86 | HR 74 | Temp 98.5°F | Resp 16 | Ht 66.0 in | Wt 262.0 lb

## 2013-02-03 DIAGNOSIS — S46911A Strain of unspecified muscle, fascia and tendon at shoulder and upper arm level, right arm, initial encounter: Secondary | ICD-10-CM

## 2013-02-03 DIAGNOSIS — IMO0002 Reserved for concepts with insufficient information to code with codable children: Secondary | ICD-10-CM

## 2013-02-03 MED ORDER — MELOXICAM 15 MG PO TABS: 15.0000 mg | ORAL_TABLET | Freq: Every day | ORAL | Status: DC

## 2013-02-03 NOTE — Progress Notes (Signed)
  Subjective:    Patient ID: CARIGAN LISTER, female    DOB: September 14, 1986, 27 y.o.   MRN: [id number]  HPI   Ms. Reiger is a very pleasant 27 yr old female here with concern for shoulder injury.  Pt is a Consulting civil engineer, Occupational hygienist and criminal justice.  Has a very heavy book bag.  Was carrying it on the right shoulder last night.  Was trying to close her car door when the bag fell off her shoulder, landing at the elbow and pulling her shoulder.  She did not feel pain right away, but became more sore when as the night went on.  Certain movements cause the pain, but she can't specifically pinpoint them.  Did not some tingling in the fingers this morning.  Also some tightness at the right side of her neck.  Hasn't taken anything for symptoms yet.     Review of Systems  Musculoskeletal: Positive for myalgias (right shoulder) and arthralgias (right shoulder).  All other systems reviewed and are negative.       Objective:   Physical Exam  Vitals reviewed. Constitutional: She is oriented to person, place, and time. She appears well-developed and well-nourished. No distress.  HENT:  Head: Normocephalic.  Eyes: Conjunctivae are normal. No scleral icterus.  Cardiovascular: Normal rate, regular rhythm and normal heart sounds.   Pulmonary/Chest: Effort normal and breath sounds normal. She has no wheezes. She has no rales.  Musculoskeletal:       Right shoulder: She exhibits tenderness. She exhibits normal range of motion, no bony tenderness, no swelling, no effusion, no crepitus, no deformity and normal strength.       Left shoulder: Normal.       Right elbow: Normal.      Arms: TTP over anterior shoulder but with full AROM, strength 5/5; neg Hawkin's, Neer's; neg sulcus sign  Neurological: She is alert and oriented to person, place, and time.  Skin: Skin is warm and dry.  Psychiatric: She has a normal mood and affect. Her behavior is normal.     Filed Vitals:   02/03/13 0810  BP:  137/86  Pulse: 74  Temp: 98.5 F (36.9 C)  Resp: 16        Assessment & Plan:  Shoulder strain, right, initial encounter - Plan: meloxicam (MOBIC) 15 MG tablet   Ms. Gathers is a very pleasant 27 yr old female here with strain of the right shoulder.  Exam is reassuring.  She is mildly TTP over the anterior shoulder at the biceps tendon.  No bony tenderness.  AROM and strength are normal.  Encouraged relative rest of the right should for the next 2-3 day (i.e. Carry book bag on left shoulder) then gradually increase activity with the right.  Mobic daily x 7 days for pain/inflammation.  Ice 2-3 times per day for the next 1-2 days.  Discussed with her that I expect gradual improvement over the next week but that she may not see full resolution for 2-3 weeks.  If worsening or not improving, will RTC.

## 2013-02-03 NOTE — Patient Instructions (Addendum)
Begin taking meloxicam (Mobic) once daily for at least the next 7 days.  Ice the shoulder 2-3 times per day for the next day or two.   Rest the shoulder for the next couple days, then gradually increase use of that arm.  I expect you to see gradual improvement this week.  If anything is getting worse, or you are not seeing improvement please let me know.   Muscle Strain Muscle strain occurs when a muscle is stretched beyond its normal length. A small number of muscle fibers generally are torn. This is especially common in athletes. This happens when a sudden, violent force placed on a muscle stretches it too far. Usually, recovery from muscle strain takes 1 to 2 weeks. Complete healing will take 5 to 6 weeks.  HOME CARE INSTRUCTIONS   While awake, apply ice to the sore muscle for the first 2 days after the injury.  Put ice in a plastic bag.  Place a towel between your skin and the bag.  Leave the ice on for 15 to 20 minutes each hour.  Do not use the strained muscle for several days, until you no longer have pain.  You may wrap the injured area with an elastic bandage for comfort. Be careful not to wrap it too tightly. This may interfere with blood circulation or increase swelling.  Only take over-the-counter or prescription medicines for pain, discomfort, or fever as directed by your caregiver. SEEK MEDICAL CARE IF:  You have increasing pain or swelling in the injured area. MAKE SURE YOU:   Understand these instructions.  Will watch your condition.  Will get help right away if you are not doing well or get worse. Document Released: 09/28/2005 Document Revised: 12/21/2011 Document Reviewed: 10/10/2011 [facility] Patient Information 2013 [place], [place].

## 2013-04-13 ENCOUNTER — Ambulatory Visit: Payer: 59 | Admitting: Obstetrics

## 2013-04-20 ENCOUNTER — Encounter: Payer: Self-pay | Admitting: Obstetrics

## 2013-04-20 ENCOUNTER — Ambulatory Visit (INDEPENDENT_AMBULATORY_CARE_PROVIDER_SITE_OTHER): Payer: 59 | Admitting: Obstetrics

## 2013-04-20 VITALS — BP 129/84 | HR 96 | Temp 98.6°F | Ht 66.0 in | Wt 272.0 lb

## 2013-04-20 DIAGNOSIS — Z113 Encounter for screening for infections with a predominantly sexual mode of transmission: Secondary | ICD-10-CM

## 2013-04-20 DIAGNOSIS — B373 Candidiasis of vulva and vagina: Secondary | ICD-10-CM

## 2013-04-20 DIAGNOSIS — H6691 Otitis media, unspecified, right ear: Secondary | ICD-10-CM

## 2013-04-20 DIAGNOSIS — Z01419 Encounter for gynecological examination (general) (routine) without abnormal findings: Secondary | ICD-10-CM

## 2013-04-20 DIAGNOSIS — Z1151 Encounter for screening for human papillomavirus (HPV): Secondary | ICD-10-CM

## 2013-04-20 DIAGNOSIS — B3789 Other sites of candidiasis: Secondary | ICD-10-CM

## 2013-04-20 DIAGNOSIS — B3731 Acute candidiasis of vulva and vagina: Secondary | ICD-10-CM | POA: Insufficient documentation

## 2013-04-20 DIAGNOSIS — E669 Obesity, unspecified: Secondary | ICD-10-CM

## 2013-04-20 DIAGNOSIS — H6690 Otitis media, unspecified, unspecified ear: Secondary | ICD-10-CM

## 2013-04-20 DIAGNOSIS — N72 Inflammatory disease of cervix uteri: Secondary | ICD-10-CM

## 2013-04-20 MED ORDER — FLUCONAZOLE 150 MG PO TABS: 150.0000 mg | ORAL_TABLET | Freq: Once | ORAL | Status: DC

## 2013-04-20 MED ORDER — CLOTRIMAZOLE 1 % EX CREA: TOPICAL_CREAM | Freq: Two times a day (BID) | CUTANEOUS | Status: DC

## 2013-04-20 MED ORDER — AMOXICILLIN-POT CLAVULANATE 875-125 MG PO TABS: 1.0000 | ORAL_TABLET | Freq: Two times a day (BID) | ORAL | Status: DC

## 2013-04-20 NOTE — Progress Notes (Signed)
.   Subjective:     Judith Moore is a 27 y.o. female here for a routine exam.  Current complaints: Vaginal itching and abnormal discharge.  Personal health questionnaire reviewed: yes.   Gynecologic History No LMP recorded. Patient is not currently having periods (Reason: IUD). Contraception: IUD Last Pap: 12/2012. Results were: normal Last mammogram: N/A  Obstetric History OB History   Grav Para Term Preterm Abortions TAB SAB Ect Mult Living   2 2 1 1  0 0 0 0 0 2     # Outc Date GA Lbr Len/2nd Wgt Sex Del Anes PTL Lv   1 TRM            2 PRE                The following portions of the patient's history were reviewed and updated as appropriate: allergies, current medications, past family history, past medical history, past social history, past surgical history and problem list.  Review of Systems Pertinent items are noted in HPI.    Objective:    General appearance: alert and no distress Breasts: normal appearance, no masses or tenderness Abdomen: normal findings: soft, non-tender Pelvic: cervix normal in appearance, external genitalia normal, no adnexal masses or tenderness, no cervical motion tenderness, rectovaginal septum normal, uterus normal size, shape, and consistency, vagina normal without discharge and rash ( Candida ) in groin, bilaterally.    Assessment:    Healthy female exam.   Candida rash in groin, bilaterally.  Otitis media   Plan:    Education reviewed: calcium supplements, low fat, low cholesterol diet, safe sex/STD prevention, self breast exams, weight bearing exercise and management of Candida rashes..   Clotrimazole cream Rx.  Augmentin Rx

## 2013-04-21 LAB — PAP IG W/ RFLX HPV ASCU

## 2013-04-21 LAB — WET PREP BY MOLECULAR PROBE: Candida species: NEGATIVE

## 2013-04-21 LAB — RPR

## 2013-04-21 LAB — HEPATITIS C ANTIBODY: HCV Ab: NEGATIVE

## 2013-04-21 LAB — HEPATITIS B SURFACE ANTIGEN: Hepatitis B Surface Ag: NEGATIVE

## 2013-04-24 ENCOUNTER — Other Ambulatory Visit: Payer: Self-pay | Admitting: *Deleted

## 2013-04-24 DIAGNOSIS — B9689 Other specified bacterial agents as the cause of diseases classified elsewhere: Secondary | ICD-10-CM

## 2013-04-24 MED ORDER — METRONIDAZOLE 500 MG PO TABS: 500.0000 mg | ORAL_TABLET | Freq: Two times a day (BID) | ORAL | Status: AC

## 2013-04-24 NOTE — Progress Notes (Signed)
Pt aware of results and prescription sent to pt's pharmacy. Pt expressed understanding.   

## 2013-05-22 ENCOUNTER — Ambulatory Visit: Payer: 59 | Admitting: Obstetrics

## 2013-05-23 ENCOUNTER — Encounter: Payer: Self-pay | Admitting: Physician Assistant

## 2013-05-29 ENCOUNTER — Ambulatory Visit (INDEPENDENT_AMBULATORY_CARE_PROVIDER_SITE_OTHER): Payer: 59 | Admitting: Obstetrics

## 2013-05-29 ENCOUNTER — Encounter: Payer: Self-pay | Admitting: Obstetrics

## 2013-05-29 VITALS — BP 126/82 | HR 72 | Temp 97.7°F | Ht 66.0 in | Wt 264.0 lb

## 2013-05-29 DIAGNOSIS — Z30431 Encounter for routine checking of intrauterine contraceptive device: Secondary | ICD-10-CM

## 2013-05-29 DIAGNOSIS — Z30430 Encounter for insertion of intrauterine contraceptive device: Secondary | ICD-10-CM

## 2013-05-29 DIAGNOSIS — Z113 Encounter for screening for infections with a predominantly sexual mode of transmission: Secondary | ICD-10-CM

## 2013-05-29 DIAGNOSIS — Z30432 Encounter for removal of intrauterine contraceptive device: Secondary | ICD-10-CM

## 2013-05-29 DIAGNOSIS — Z3202 Encounter for pregnancy test, result negative: Secondary | ICD-10-CM

## 2013-05-29 MED ORDER — LEVONORGESTREL 20 MCG/24HR IU IUD: INTRAUTERINE_SYSTEM | Freq: Once | INTRAUTERINE | Status: DC

## 2013-05-29 NOTE — Progress Notes (Signed)
Subjective:  IUD Insertion &  Removal Procedure Note  IUD Removal IUD removed in routine fashion prior to insertion of new IUD.  IUD Insertion Pre-operative Diagnosis: IUD Surveillance  Post-operative Diagnosis: same  Indications: contraception  Procedure Details  Patient states her current IUD was [facility] in August of [facility]. Urine pregnancy test was done 05/29/2013 and result was negative.  The risks (including infection, bleeding, pain, and uterine perforation) and benefits of the procedure were explained to the patient and Written informed consent was obtained.    Cervix cleansed with Betadine. Uterus sounded to 6 cm. IUD inserted without difficulty. String visible and trimmed. Patient tolerated procedure well.  IUD Information: Toney Reil, Lot # [id number], Expiration date August 2016.  Condition: Stable  Complications: None  Plan:  The patient was advised to call for any fever or for prolonged or severe pain or bleeding. She was advised to use NSAID as needed for mild to moderate pain.   Attending Physician Documentation: I was present for or participated in the entire procedure, including opening and closing.

## 2013-05-30 LAB — WET PREP BY MOLECULAR PROBE: Candida species: NEGATIVE

## 2013-05-31 ENCOUNTER — Ambulatory Visit (HOSPITAL_COMMUNITY)
Admission: RE | Admit: 2013-05-31 | Discharge: 2013-05-31 | Disposition: A | Discharge status: Home / Self Care | Payer: 59 | Source: Ambulatory Visit | Attending: Obstetrics | Admitting: Obstetrics

## 2013-05-31 DIAGNOSIS — N854 Malposition of uterus: Secondary | ICD-10-CM | POA: Insufficient documentation

## 2013-05-31 DIAGNOSIS — Z30431 Encounter for routine checking of intrauterine contraceptive device: Secondary | ICD-10-CM

## 2013-06-13 LAB — ANAEROBIC CULTURE: Gram Stain: NONE SEEN

## 2013-07-10 ENCOUNTER — Ambulatory Visit: Payer: 59 | Admitting: Obstetrics

## 2014-03-12 ENCOUNTER — Encounter (HOSPITAL_BASED_OUTPATIENT_CLINIC_OR_DEPARTMENT_OTHER): Payer: Self-pay | Admitting: Emergency Medicine

## 2014-03-12 ENCOUNTER — Emergency Department (HOSPITAL_BASED_OUTPATIENT_CLINIC_OR_DEPARTMENT_OTHER): Payer: 59

## 2014-03-12 ENCOUNTER — Emergency Department (HOSPITAL_BASED_OUTPATIENT_CLINIC_OR_DEPARTMENT_OTHER)
Admission: EM | Admit: 2014-03-12 | Discharge: 2014-03-12 | Disposition: A | Discharge status: Home / Self Care | Payer: 59 | Attending: Emergency Medicine | Admitting: Emergency Medicine

## 2014-03-12 DIAGNOSIS — Z791 Long term (current) use of non-steroidal anti-inflammatories (NSAID): Secondary | ICD-10-CM | POA: Insufficient documentation

## 2014-03-12 DIAGNOSIS — M545 Low back pain, unspecified: Secondary | ICD-10-CM | POA: Insufficient documentation

## 2014-03-12 DIAGNOSIS — M549 Dorsalgia, unspecified: Secondary | ICD-10-CM

## 2014-03-12 MED ORDER — HYDROCODONE-ACETAMINOPHEN 5-325 MG PO TABS: 1.0000 | ORAL_TABLET | ORAL | Status: DC | PRN

## 2014-03-12 MED ORDER — CYCLOBENZAPRINE HCL 5 MG PO TABS: 5.0000 mg | ORAL_TABLET | Freq: Two times a day (BID) | ORAL | Status: DC | PRN

## 2014-03-12 NOTE — ED Provider Notes (Signed)
CSN: [id number]     Arrival date & time 03/12/14  1256 History   First MD Initiated Contact with Patient 03/12/14 1316     Chief Complaint  Patient presents with  . Back Pain     (Consider location/radiation/quality/duration/timing/severity/associated sxs/prior Treatment) HPI Comments: Pt c/o lower back pain for the last 2 weeks. Pt states that in the last couple of days the pain has increased. Denies numbness or weakness or dysuria. No recent falls. Pt has been taking ibuprofen with minimal relief.  The history is provided by the patient. No language interpreter was used.    History reviewed. No pertinent past medical history. Past Surgical History  Procedure Laterality Date  . Tonsillectomy    . Adenoidectomy    . Cesarean section  09/28/2005, 11/28/2007   Family History  Problem Relation Age of Onset  . Cancer Neg Hx   . Hypertension Mother   . Heart disease Neg Hx   . Diabetes Maternal Uncle   . Healthy Brother     x1   History  Substance Use Topics  . Smoking status: Never Smoker   . Smokeless tobacco: Never Used  . Alcohol Use: Yes   OB History   Grav Para Term Preterm Abortions TAB SAB Ect Mult Living   2 2 1 1  0 0 0 0 0 2     Review of Systems  Constitutional: Negative.   Respiratory: Negative.   Cardiovascular: Negative.       Allergies  Review of patient's allergies indicates no known allergies.  Home Medications   Prior to Admission medications   Medication Sig Start Date End Date Taking? Authorizing Provider  ibuprofen (ADVIL,MOTRIN) 600 MG tablet Take 600 mg by mouth every 6 (six) hours as needed.   Yes Historical Provider, MD   BP 132/71  Pulse 75  Temp(Src) 99.6 F (37.6 C) (Oral)  Resp 16  Ht 5\' 6"  (1.676 m)  Wt 165 lb (74.844 kg)  BMI 26.64 kg/m2  SpO2 100% Physical Exam  Vitals reviewed. Constitutional: She is oriented to person, place, and time. She appears well-developed and well-nourished.  Neck: Neck supple.  Cardiovascular:  Normal rate and regular rhythm.   Pulmonary/Chest: Effort normal and breath sounds normal.  Musculoskeletal: Normal range of motion.  Lumbar spine and paraspinal tenderness  Neurological: She is alert and oriented to person, place, and time. She exhibits normal muscle tone. Coordination normal.  Skin: Skin is warm and dry.  Psychiatric: She has a normal mood and affect.    ED Course  Procedures (including critical care time) Labs Review Labs Reviewed - No data to display  Imaging Review Dg Lumbar Spine Complete  03/12/2014   CLINICAL DATA:  Low back pain.  EXAM: LUMBAR SPINE - COMPLETE 4+ VIEW  COMPARISON:  None.  FINDINGS: Paraspinal soft tissues are normal. No acute bony abnormality identified. IUD is present. Metallic density noted pelvis.  IMPRESSION: No acute bony abnormality identified.   Electronically Signed   By: Maisie Fus  Register   On: 03/12/2014 14:33     EKG Interpretation None      MDM   Final diagnoses:  Back pain    Pt doesn't have any red flags. Neurovascularly intact. Will treat symptomatically. Instructed pt on return precations   Teressa Lower, NP 03/12/14 1441

## 2014-03-12 NOTE — ED Notes (Signed)
NP at bedside.

## 2014-03-12 NOTE — ED Notes (Signed)
Pt c/o lower back pain x 2 weeks yesterday increased pain

## 2014-03-12 NOTE — Discharge Instructions (Signed)
Back Pain, Adult Low back pain is very common. About 1 in 5 people have back pain.The cause of low back pain is rarely dangerous. The pain often gets better over time.About half of people with a sudden onset of back pain feel better in just 2 weeks. About 8 in 10 people feel better by 6 weeks.  CAUSES Some common causes of back pain include:  Strain of the muscles or ligaments supporting the spine.  Wear and tear (degeneration) of the spinal discs.  Arthritis.  Direct injury to the back. DIAGNOSIS Most of the time, the direct cause of low back pain is not known.However, back pain can be treated effectively even when the exact cause of the pain is unknown.Answering your caregiver's questions about your overall health and symptoms is one of the most accurate ways to make sure the cause of your pain is not dangerous. If your caregiver needs more information, he or she may order lab work or imaging tests (X-rays or MRIs).However, even if imaging tests show changes in your back, this usually does not require surgery. HOME CARE INSTRUCTIONS For many people, back pain returns.Since low back pain is rarely dangerous, it is often a condition that people can learn to manageon their own.   Remain active. It is stressful on the back to sit or stand in one place. Do not sit, drive, or stand in one place for more than 30 minutes at a time. Take short walks on level surfaces as soon as pain allows.Try to increase the length of time you walk each day.  Do not stay in bed.Resting more than 1 or 2 days can delay your recovery.  Do not avoid exercise or work.Your body is made to move.It is not dangerous to be active, even though your back may hurt.Your back will likely heal faster if you return to being active before your pain is gone.  Pay attention to your body when you bend and lift. Many people have less discomfortwhen lifting if they bend their knees, keep the load close to their bodies,and  avoid twisting. Often, the most comfortable positions are those that put less stress on your recovering back.  Find a comfortable position to sleep. Use a firm mattress and lie on your side with your knees slightly bent. If you lie on your back, put a pillow under your knees.  Only take over-the-counter or prescription medicines as directed by your caregiver. Over-the-counter medicines to reduce pain and inflammation are often the most helpful.Your caregiver may prescribe muscle relaxant drugs.These medicines help dull your pain so you can more quickly return to your normal activities and healthy exercise.  Put ice on the injured area.  Put ice in a plastic bag.  Place a towel between your skin and the bag.  Leave the ice on for 15-20 minutes, 03-04 times a day for the first 2 to 3 days. After that, ice and heat may be alternated to reduce pain and spasms.  Ask your caregiver about trying back exercises and gentle massage. This may be of some benefit.  Avoid feeling anxious or stressed.Stress increases muscle tension and can worsen back pain.It is important to recognize when you are anxious or stressed and learn ways to manage it.Exercise is a great option. SEEK MEDICAL CARE IF:  You have pain that is not relieved with rest or medicine.  You have pain that does not improve in 1 week.  You have new symptoms.  You are generally not feeling well. SEEK   IMMEDIATE MEDICAL CARE IF:   You have pain that radiates from your back into your legs.  You develop new bowel or bladder control problems.  You have unusual weakness or numbness in your arms or legs.  You develop nausea or vomiting.  You develop abdominal pain.  You feel faint. Document Released: 09/28/2005 Document Revised: 03/29/2012 Document Reviewed: 02/16/2011 [employer] Patient Information 2014 [employer].  

## 2014-03-15 NOTE — ED Provider Notes (Signed)
Medical screening examination/treatment/procedure(s) were performed by non-physician practitioner and as supervising physician I was immediately available for consultation/collaboration.   EKG Interpretation None        Candyce Churn III, MD 03/15/14 1110

## 2014-04-16 ENCOUNTER — Ambulatory Visit (INDEPENDENT_AMBULATORY_CARE_PROVIDER_SITE_OTHER): Payer: 59 | Admitting: Family Medicine

## 2014-04-16 VITALS — BP 112/62 | HR 83 | Temp 98.6°F | Resp 18 | Ht 66.5 in | Wt 272.0 lb

## 2014-04-16 DIAGNOSIS — R300 Dysuria: Secondary | ICD-10-CM

## 2014-04-16 LAB — POCT UA - MICROSCOPIC ONLY
Amorphous: POSITIVE
Casts, Ur, LPF, POC: NEGATIVE
Crystals, Ur, HPF, POC: NEGATIVE
Yeast, UA: NEGATIVE

## 2014-04-16 LAB — POCT URINALYSIS DIPSTICK
Bilirubin, UA: NEGATIVE
Blood, UA: NEGATIVE
Glucose, UA: NEGATIVE
Ketones, UA: NEGATIVE
Leukocytes, UA: NEGATIVE
Nitrite, UA: NEGATIVE
Protein, UA: NEGATIVE
Spec Grav, UA: 1.015
Urobilinogen, UA: 1
pH, UA: 5.5

## 2014-04-16 LAB — POCT URINE PREGNANCY: PREG TEST UR: NEGATIVE

## 2014-04-16 MED ORDER — FLUCONAZOLE 150 MG PO TABS: 150.0000 mg | ORAL_TABLET | Freq: Once | ORAL | Status: DC

## 2014-04-16 MED ORDER — CIPROFLOXACIN HCL 500 MG PO TABS: 500.0000 mg | ORAL_TABLET | Freq: Two times a day (BID) | ORAL | Status: DC

## 2014-04-16 NOTE — Progress Notes (Addendum)
[facility] [address], [place] [id number] [phone]  Date:  04/16/2014   Name:  Judith Housekeeperimeka C Kuk   DOB:  [birth year]   MRN:  [id number]  PCP:  No PCP Per Patient    Chief Complaint: Urinary Frequency and Abdominal Pain   History of Present Illness:  Judith Moore is a 28 y.o. very pleasant female patient who presents with the following:  She is here today with stomach and back pain- suprapubic abd pain and right sided back pain. Has noted this pain for about 1 week  She has noted "foul odor" to her urine and urinary urgency, frequency. No dysuria. No blood in her urine She has felt nauseated, but no vomiting.   She has nto noted a fever.    No vaginal sx- she did have yeast infection last week but this is now cleared.   She has an IUD that was placed last year (her 2nd IUD).  She is generally in good health She does not get menses generally  She has had C/S X2.  No other abdominal surgery . She has 2 school- aged children.    Patient Active Problem List   Diagnosis Date Noted  . Candida rash of groin 04/20/2013  . Otitis media 04/20/2013  . Candidiasis of vulva and vagina 04/20/2013  . Obesity 04/20/2013    History reviewed. No pertinent past medical history.  Past Surgical History  Procedure Laterality Date  . Tonsillectomy    . Adenoidectomy    . Cesarean section  09/28/2005, 11/28/2007    History  Substance Use Topics  . Smoking status: Never Smoker   . Smokeless tobacco: Never Used  . Alcohol Use: Yes    Family History  Problem Relation Age of Onset  . Cancer Neg Hx   . Hypertension Mother   . Heart disease Neg Hx   . Diabetes Maternal Uncle   . Healthy Brother     x1    No Known Allergies  Medication list has been reviewed and updated.  Current Outpatient Prescriptions on File Prior to Visit  Medication Sig Dispense Refill  . cyclobenzaprine (FLEXERIL) 5 MG tablet Take 1 tablet (5 mg total) by mouth 2 (two) times  daily as needed for muscle spasms.  20 tablet  0  . HYDROcodone-acetaminophen (NORCO/VICODIN) 5-325 MG per tablet Take 1-2 tablets by mouth every 4 (four) hours as needed.  15 tablet  0  . ibuprofen (ADVIL,MOTRIN) 600 MG tablet Take 600 mg by mouth every 6 (six) hours as needed.       Current Facility-Administered Medications on File Prior to Visit  Medication Dose Route Frequency Provider Last Rate Last Dose  . levonorgestrel (MIRENA) 20 MCG/24HR IUD   Intrauterine Once Brock Badharles A Harper, MD        Review of Systems:  As per HPI- otherwise negative.   Physical Examination: Filed Vitals:   04/16/14 1313  BP: 112/62  Pulse: 83  Temp: 98.6 F (37 C)  Resp: 18   Filed Vitals:   04/16/14 1313  Height: 5' 6.5" (1.689 m)  Weight: 272 lb (123.378 kg)   Body mass index is 43.25 kg/(m^2). Ideal Body Weight: Weight in (lb) to have BMI = 25: 156.9  GEN: WDWN, NAD, Non-toxic, A & O x 3, obese, looks well HEENT: Atraumatic, Normocephalic. Neck supple. No masses, No LAD. Ears and Nose: No external deformity. CV: RRR, No M/G/R. No JVD. No thrill. No extra heart sounds.  PULM: CTA B, no wheezes, crackles, rhonchi. No retractions. No resp. distress. No accessory muscle use. ABD: S, ND, +BS. No rebound, no HSM.  She has mild suprapubic tenderness, no CVA tenderness  EXTR: No c/c/e NEURO Normal gait.  PSYCH: Normally interactive. Conversant. Not depressed or anxious appearing.  Calm demeanor.   Results for orders placed in visit on 04/16/14  POCT UA - MICROSCOPIC ONLY      Result Value Ref Range   WBC, Ur, HPF, POC 2-10     RBC, urine, microscopic 2-4     Bacteria, U Microscopic 3+     Mucus, UA trace     Epithelial cells, urine per micros 1-2     Crystals, Ur, HPF, POC neg     Casts, Ur, LPF, POC neg     Yeast, UA neg     Amorphous positive    POCT URINALYSIS DIPSTICK      Result Value Ref Range   Color, UA yellow     Clarity, UA clear     Glucose, UA neg     Bilirubin, UA neg      Ketones, UA neg     Spec Grav, UA 1.015     Blood, UA neg     pH, UA 5.5     Protein, UA neg     Urobilinogen, UA 1.0     Nitrite, UA neg     Leukocytes, UA Negative    POCT URINE PREGNANCY      Result Value Ref Range   Preg Test, Ur Negative       Assessment and Plan: Dysuria - Plan: POCT UA - Microscopic Only, POCT urinalysis dipstick, Urine culture, POCT urine pregnancy, ciprofloxacin (CIPRO) 500 MG tablet, fluconazole (DIFLUCAN) 150 MG tablet  UTI. Treat with cipro, gave fluconazole for her to use if needed for yeast vaginitis.  She will follow-up closely if she is not better in the next 24 hours- Sooner if worse.   Signed Abbe AmsterdamJessica Dianelly Ferran, MD  7/9: received urine culture. E coli sensitive to cipro.  Called her- she is feeling better  .

## 2014-04-16 NOTE — Patient Instructions (Signed)
I will be in touch with your labs asap.   Drink plenty of liquids.  If you are not getting better in the next 24 hours please let me know- Sooner if worse.   Use the cipro as directed, and the diflucan

## 2014-04-19 LAB — URINE CULTURE: Colony Count: >=100000

## 2014-04-24 ENCOUNTER — Ambulatory Visit: Payer: 59 | Admitting: Obstetrics

## 2014-11-09 ENCOUNTER — Other Ambulatory Visit: Payer: Self-pay

## 2014-11-09 ENCOUNTER — Ambulatory Visit (INDEPENDENT_AMBULATORY_CARE_PROVIDER_SITE_OTHER): Payer: 59 | Admitting: Family Medicine

## 2014-11-09 VITALS — BP 134/80 | HR 106 | Temp 99.4°F | Resp 16 | Ht 67.0 in | Wt 277.0 lb

## 2014-11-09 DIAGNOSIS — H6123 Impacted cerumen, bilateral: Secondary | ICD-10-CM

## 2014-11-09 DIAGNOSIS — J029 Acute pharyngitis, unspecified: Secondary | ICD-10-CM

## 2014-11-09 LAB — POCT RAPID STREP A (OFFICE): RAPID STREP A SCREEN: NEGATIVE

## 2014-11-09 NOTE — Progress Notes (Signed)
Subjective: 29 year old lady who has a sore throat and illness. Her son was diagnosed last week with strep. She had some symptoms a little over a week ago, treated herself symptomatically with OTC medications, and seemed to improve. Then through 4 days ago she started having symptoms again. She has a bad sore throat. She has a frontal headache and occipital headache. She has been treating herself with  ibuprofen and some OTC meds. She is not feeling better. She has continued to work this week. She does not smoke. She does not have much in way of a cough or runny nose.  Patient inquired what I thought about weight loss surgery.  Objective: Pleasant lady in no major distress. Overweight. TMs normal except for some cerumen in both ears. Her throat is clear but erythematous. No exudate. Neck supple with no major nodes. Chest clear to auscultation. Heart regular without murmurs.  Assessment: Pharyngitis, suspicious for strep Cerumen in ears Fever Obesity  Plan: Strep test Consider using Debrox Work hard on weight loss. We had a long discussion about.

## 2014-11-09 NOTE — Telephone Encounter (Signed)
Please advise abx nothing in medications ordered- per OV pt is to take amox BID until strep results-  Pended diflucan per protocol.

## 2014-11-09 NOTE — Telephone Encounter (Signed)
I will give it to her, however she would be wiser not to waste money or any minimal chance of adverse effects of medicine by waiting to see if she gets symptoms before taking any meds.  Rx: diflucan 150    Take single dose for symptoms of yeast   Prescribe #1    No refills

## 2014-11-09 NOTE — Telephone Encounter (Signed)
Pt wants to know if diflucan can be sent to her pharmacy. States she just received an antibiotic and usually likes to have diflucan along with antibiotics.  CB # [id number]   [facility] [address]

## 2014-11-09 NOTE — Patient Instructions (Addendum)
Clean ears using Debrox as discussed.  Take amoxicillin twice daily.  If strept is negative you should discard medicine you do not use.  Tylenol or ibuprofen for pain  Work hard on weight loss.   Pharyngitis Pharyngitis is redness, pain, and swelling (inflammation) of your pharynx.  CAUSES  Pharyngitis is usually caused by infection. Most of the time, these infections are from viruses (viral) and are part of a cold. However, sometimes pharyngitis is caused by bacteria (bacterial). Pharyngitis can also be caused by allergies. Viral pharyngitis may be spread from person to person by coughing, sneezing, and personal items or utensils (cups, forks, spoons, toothbrushes). Bacterial pharyngitis may be spread from person to person by more intimate contact, such as kissing.  SIGNS AND SYMPTOMS  Symptoms of pharyngitis include:   Sore throat.   Tiredness (fatigue).   Low-grade fever.   Headache.  Joint pain and muscle aches.  Skin rashes.  Swollen lymph nodes.  Plaque-like film on throat or tonsils (often seen with bacterial pharyngitis). DIAGNOSIS  Your health care provider will ask you questions about your illness and your symptoms. Your medical history, along with a physical exam, is often all that is needed to diagnose pharyngitis. Sometimes, a rapid strep test is done. Other lab tests may also be done, depending on the suspected cause.  TREATMENT  Viral pharyngitis will usually get better in 3-4 days without the use of medicine. Bacterial pharyngitis is treated with medicines that kill germs (antibiotics).  HOME CARE INSTRUCTIONS   Drink enough water and fluids to keep your urine clear or pale yellow.   Only take over-the-counter or prescription medicines as directed by your health care provider:   If you are prescribed antibiotics, make sure you finish them even if you start to feel better.   Do not take aspirin.   Get lots of rest.   Gargle with 8 oz of salt  water ( tsp of salt per 1 qt of water) as often as every 1-2 hours to soothe your throat.   Throat lozenges (if you are not at risk for choking) or sprays may be used to soothe your throat. SEEK MEDICAL CARE IF:   You have large, tender lumps in your neck.  You have a rash.  You cough up green, yellow-brown, or bloody spit. SEEK IMMEDIATE MEDICAL CARE IF:   Your neck becomes stiff.  You drool or are unable to swallow liquids.  You vomit or are unable to keep medicines or liquids down.  You have severe pain that does not go away with the use of recommended medicines.  You have trouble breathing (not caused by a stuffy nose). MAKE SURE YOU:   Understand these instructions.  Will watch your condition.  Will get help right away if you are not doing well or get worse. Document Released: 09/28/2005 Document Revised: 07/19/2013 Document Reviewed: 06/05/2013 [facility] Patient Information 2015 [employer] [place]. This information is not intended to replace advice given to you by your health care provider. Make sure you discuss any questions you have with your health care provider.

## 2014-11-10 ENCOUNTER — Telehealth: Payer: Self-pay

## 2014-11-10 DIAGNOSIS — J029 Acute pharyngitis, unspecified: Secondary | ICD-10-CM

## 2014-11-10 MED ORDER — AMOXICILLIN 875 MG PO TABS: 875.0000 mg | ORAL_TABLET | Freq: Two times a day (BID) | ORAL | Status: DC

## 2014-11-10 NOTE — Telephone Encounter (Signed)
PATIENT STATES SHE SAW DR. Alwyn RenHOPPER Friday FOR STREP THROAT. HE PRESCRIBED HER AMOXICILLIN, BUT WHEN SHE WENT TO THE PHARMACY TO PICK IT UP, IT WAS NOT THERE. BEST PHONE [phone] (CELL)  PHARMACY CHOICE IS [facility] [address].  MBC

## 2014-11-10 NOTE — Addendum Note (Signed)
Addended by: Maryann AlarBURNS, Marq Rebello M on: 11/10/2014 01:44 PM   Modules accepted: Orders

## 2014-11-11 LAB — CULTURE, GROUP A STREP

## 2014-11-11 MED ORDER — AMOXICILLIN 875 MG PO TABS
875.0000 mg | ORAL_TABLET | Freq: Two times a day (BID) | ORAL | Status: DC
Start: 2014-11-11 — End: 2015-08-22

## 2014-11-11 NOTE — Telephone Encounter (Signed)
[facility] [address] Called patient to notify. No answer so left message to return call.  The RX that was sent in Friday was ordered under fax. Reordered today and changed to normal so it would ecribe to pharmacy.

## 2014-11-11 NOTE — Telephone Encounter (Signed)
Gave pt message.

## 2014-11-16 MED ORDER — FLUCONAZOLE 150 MG PO TABS: ORAL_TABLET | ORAL | Status: DC

## 2014-12-24 ENCOUNTER — Other Ambulatory Visit (INDEPENDENT_AMBULATORY_CARE_PROVIDER_SITE_OTHER): Payer: Self-pay | Admitting: General Surgery

## 2014-12-25 ENCOUNTER — Other Ambulatory Visit (INDEPENDENT_AMBULATORY_CARE_PROVIDER_SITE_OTHER): Payer: Self-pay | Admitting: General Surgery

## 2015-01-14 DIAGNOSIS — K219 Gastro-esophageal reflux disease without esophagitis: Secondary | ICD-10-CM | POA: Insufficient documentation

## 2015-01-14 DIAGNOSIS — K8020 Calculus of gallbladder without cholecystitis without obstruction: Secondary | ICD-10-CM | POA: Insufficient documentation

## 2015-01-14 DIAGNOSIS — M545 Low back pain, unspecified: Secondary | ICD-10-CM | POA: Insufficient documentation

## 2015-01-14 DIAGNOSIS — E785 Hyperlipidemia, unspecified: Secondary | ICD-10-CM | POA: Insufficient documentation

## 2015-01-16 ENCOUNTER — Encounter (HOSPITAL_COMMUNITY): Payer: Self-pay

## 2015-01-16 ENCOUNTER — Inpatient Hospital Stay (HOSPITAL_COMMUNITY): Admission: RE | Admit: 2015-01-16 | Payer: Self-pay | Source: Ambulatory Visit

## 2015-01-16 ENCOUNTER — Ambulatory Visit (HOSPITAL_COMMUNITY): Admit: 2015-01-16 | Payer: Self-pay | Admitting: General Surgery

## 2015-01-16 SURGERY — BREATH TEST, FOR HELICOBACTER PYLORI

## 2015-03-16 ENCOUNTER — Ambulatory Visit: Payer: Self-pay | Admitting: Dietician

## 2015-08-06 ENCOUNTER — Emergency Department (HOSPITAL_BASED_OUTPATIENT_CLINIC_OR_DEPARTMENT_OTHER)
Admission: EM | Admit: 2015-08-06 | Discharge: 2015-08-06 | Disposition: A | Discharge status: Home / Self Care | Payer: 59 | Attending: Emergency Medicine | Admitting: Emergency Medicine

## 2015-08-06 ENCOUNTER — Encounter (HOSPITAL_BASED_OUTPATIENT_CLINIC_OR_DEPARTMENT_OTHER): Payer: Self-pay | Admitting: Emergency Medicine

## 2015-08-06 DIAGNOSIS — Z792 Long term (current) use of antibiotics: Secondary | ICD-10-CM | POA: Diagnosis not present

## 2015-08-06 DIAGNOSIS — R531 Weakness: Secondary | ICD-10-CM

## 2015-08-06 DIAGNOSIS — E860 Dehydration: Secondary | ICD-10-CM | POA: Diagnosis not present

## 2015-08-06 LAB — COMPREHENSIVE METABOLIC PANEL
ALBUMIN: 3.3 g/dL — AB (ref 3.5–5.0)
ALK PHOS: 66 U/L (ref 38–126)
ALT: 205 U/L — AB (ref 14–54)
ANION GAP: 3 — AB (ref 5–15)
AST: 116 U/L — AB (ref 15–41)
BILIRUBIN TOTAL: 0.8 mg/dL (ref 0.3–1.2)
BUN: 12 mg/dL (ref 6–20)
CO2: 28 mmol/L (ref 22–32)
CREATININE: 0.48 mg/dL (ref 0.44–1.00)
Calcium: 9 mg/dL (ref 8.9–10.3)
Chloride: 108 mmol/L (ref 101–111)
GFR calc Af Amer: >60 mL/min (ref >60)
GFR calc non Af Amer: >60 mL/min (ref >60)
GLUCOSE: 120 mg/dL — AB (ref 65–99)
Potassium: 4.2 mmol/L (ref 3.5–5.1)
SODIUM: 139 mmol/L (ref 135–145)
Total Protein: 6.8 g/dL (ref 6.5–8.1)

## 2015-08-06 LAB — CBC
HEMATOCRIT: 34.4 % — AB (ref 36.0–46.0)
HEMOGLOBIN: 11.2 g/dL — AB (ref 12.0–15.0)
MCH: 26.3 pg (ref 26.0–34.0)
MCHC: 32.6 g/dL (ref 30.0–36.0)
MCV: 80.8 fL (ref 78.0–100.0)
Platelets: 279 10*3/uL (ref 150–400)
RBC: 4.26 MIL/uL (ref 3.87–5.11)
RDW: 12.6 % (ref 11.5–15.5)
WBC: 4.3 10*3/uL (ref 4.0–10.5)

## 2015-08-06 LAB — URINALYSIS, ROUTINE W REFLEX MICROSCOPIC
Bilirubin Urine: NEGATIVE
GLUCOSE, UA: NEGATIVE mg/dL
HGB URINE DIPSTICK: NEGATIVE
Ketones, ur: 15 mg/dL — AB
LEUKOCYTES UA: NEGATIVE
Nitrite: NEGATIVE
PH: 7 (ref 5.0–8.0)
PROTEIN: NEGATIVE mg/dL
SPECIFIC GRAVITY, URINE: 1.027 (ref 1.005–1.030)
Urobilinogen, UA: 1 mg/dL (ref 0.0–1.0)

## 2015-08-06 MED ORDER — SODIUM CHLORIDE 0.9 % IV BOLUS (SEPSIS)
1000.0000 mL | Freq: Once | INTRAVENOUS | Status: AC
  Administered 2015-08-06: 1000 mL via INTRAVENOUS

## 2015-08-06 NOTE — ED Notes (Signed)
EKG Performed and patient placed on cardiac monitor. Vital signs set to reassess Q30.

## 2015-08-06 NOTE — ED Notes (Signed)
Snack meal given

## 2015-08-06 NOTE — ED Provider Notes (Signed)
CSN: [id number]     Arrival date & time 08/06/15  [id number] History   First MD Initiated Contact with Patient 08/06/15 1013     Chief Complaint  Patient presents with  . Weakness     (Consider location/radiation/quality/duration/timing/severity/associated sxs/prior Treatment) HPI Comments: Patient here with weakness. She feels diffusely weak and occasionally dizzy when getting up. 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 Denies fevers, diarrhea. She has occasional nausea, but states it's normally from eating too fast.  Patient is a 29 y.o. female presenting with weakness. The history is provided by the patient.  Weakness This is a new problem. The current episode started 2 days ago. The problem occurs constantly. The problem has not changed since onset.Pertinent negatives include no chest pain, no abdominal pain and no shortness of breath. Nothing aggravates the symptoms. Nothing relieves the symptoms.    No past medical history on file. Past Surgical History  Procedure Laterality Date  . Tonsillectomy    . Adenoidectomy    . Cesarean section  09/28/2005, 11/28/2007  . Stomach banding     Family History  Problem Relation Age of Onset  . Cancer Neg Hx   . Hypertension Mother   . Heart disease Neg Hx   . Diabetes Maternal Uncle   . Healthy Brother     x1   Social History  Substance Use Topics  . Smoking status: Never Smoker   . Smokeless tobacco: Never Used  . Alcohol Use: Yes   OB History    Gravida Para Term Preterm AB TAB SAB Ectopic Multiple Living   2 2 1 1  0 0 0 0 0 2     Review of Systems  Constitutional: Negative for fever and chills.  Respiratory: Negative for cough and shortness of breath.   Cardiovascular: Negative for chest pain.  Gastrointestinal: Negative for abdominal pain.  Neurological: Positive for dizziness and weakness.  All other systems reviewed and are negative.     Allergies  Review of patient's allergies indicates no known allergies.  Home  Medications   Prior to Admission medications   Medication Sig Start Date End Date Taking? Authorizing Provider  amoxicillin (AMOXIL) 875 MG tablet Take 1 tablet (875 mg total) by mouth 2 (two) times daily. 11/11/14   Peyton Najjaravid H Hopper, MD  fluconazole (DIFLUCAN) 150 MG tablet TAKE SINGLE DOSE FOR SYMPTOMS OF YEAST 11/16/14   Chelle Jeffery, PA-C   BP 121/59 mmHg  Pulse 114  Resp 32  Ht 5\' 6"  (1.676 m)  Wt 232 lb (105.235 kg)  BMI 37.46 kg/m2  SpO2 100% Physical Exam  Constitutional: She is oriented to person, place, and time. She appears well-developed and well-nourished. No distress.  HENT:  Head: Normocephalic and atraumatic.  Mouth/Throat: Oropharynx is clear and moist.  Eyes: EOM are normal. Pupils are equal, round, and reactive to light.  Neck: Normal range of motion. Neck supple.  Cardiovascular: Normal rate and regular rhythm.  Exam reveals no friction rub.   No murmur heard. Pulmonary/Chest: Effort normal and breath sounds normal. No respiratory distress. She has no wheezes. She has no rales.  Abdominal: Soft. She exhibits no distension. There is no tenderness. There is no rebound.  Musculoskeletal: Normal range of motion. She exhibits no edema.  Neurological: She is alert and oriented to person, place, and time.  Skin: She is not diaphoretic.  Nursing note and vitals reviewed.   ED Course  Procedures (including critical care time) Labs Review Labs Reviewed  CBC - Abnormal; Notable  for the following:    Hemoglobin 11.2 (*)    HCT 34.4 (*)    All other components within normal limits  COMPREHENSIVE METABOLIC PANEL - Abnormal; Notable for the following:    Glucose, Bld 120 (*)    Albumin 3.3 (*)    AST 116 (*)    ALT 205 (*)    Anion gap 3 (*)    All other components within normal limits  URINALYSIS, ROUTINE W REFLEX MICROSCOPIC (NOT AT [facility]) - Abnormal; Notable for the following:    Color, Urine AMBER (*)    Ketones, ur 15 (*)    All other components within normal  limits    Imaging Review No results found. I have personally reviewed and evaluated these images and lab results as part of my medical decision-making.   EKG Interpretation None      MDM   Final diagnoses:  Weakness  Dehydration    29 year old female here some mild weakness and occasional dizziness. She also had some bilateral leg sling for the past few days. She has been eating recently had a sleeve gastrectomy about 7 weeks ago. She denies any fever, diarrhea. She has had occasional vomiting due to eating too fast. Here she is well appearing she is tachycardic however in the 120s. She has no abdominal pain. I believe she would be a little dehydrated as her sleeve gastrectomy decreases her by mouth intake. Labs are normal and show no signs of dehydration. She does fill better after 1 L of fluids and she is tolerating food here without difficulty. She stable for discharge and she feels comfortable going home to follow-up with her gastric bypass surgeon.    Elwin Mocha, MD 08/06/15 [phone]

## 2015-08-06 NOTE — Discharge Instructions (Signed)

## 2015-08-22 ENCOUNTER — Encounter: Payer: Self-pay | Admitting: Endocrinology

## 2015-08-22 ENCOUNTER — Ambulatory Visit (INDEPENDENT_AMBULATORY_CARE_PROVIDER_SITE_OTHER): Payer: 59 | Admitting: Endocrinology

## 2015-08-22 VITALS — BP 149/78 | HR 125 | Temp 98.2°F | Ht 67.0 in | Wt 218.0 lb

## 2015-08-22 DIAGNOSIS — Z9884 Bariatric surgery status: Secondary | ICD-10-CM

## 2015-08-22 MED ORDER — METOPROLOL SUCCINATE ER 25 MG PO TB24: 25.0000 mg | ORAL_TABLET | Freq: Every day | ORAL | Status: DC

## 2015-08-22 MED ORDER — METHIMAZOLE 10 MG PO TABS: 40.0000 mg | ORAL_TABLET | Freq: Two times a day (BID) | ORAL | Status: DC

## 2015-08-22 NOTE — Patient Instructions (Addendum)
Please increase the methimazole.  i have sent a prescription [facility] your [facility].  i have also sent a prescription to your pharmacy, for a pill to slow the heart rate.  if ever you have fever while taking methimazole, stop it and call us, even if the reason is obvious, because of the risk of a rare side-effect. In view of your medical condition, you should avoid pregnancy until we have decided it is safe.   Please come back for a follow-up appointment in 1 month.

## 2015-08-22 NOTE — Progress Notes (Signed)
Subjective:    Patient ID: Judith Moore, female    DOB: 09/30/1986, 30 y.o.   MRN: [id number]  HPI Pt reports he was dx'ed with hyperthyroidism in 2011.  she has never been on therapy for this x approx 1 week.  she has never had XRT to the anterior neck, or thyroid surgery.  she has never had thyroid imaging.  she does not consume kelp or any other prescribed or non-prescribed thyroid medication.  she has never been on amiodarone.  She is not considering a pregnancy.   She reports moderate tremor of the hands, and assoc fatigue.   No past medical history on file.  Past Surgical History  Procedure Laterality Date  . Tonsillectomy    . Adenoidectomy    . Cesarean section  09/28/2005, 11/28/2007  . Stomach banding      Social History   Social History  . Marital Status: Married    Spouse Name: SEPARATED  . Number of Children: 2  . Years of Education: 15+   Occupational History  . 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 Smoking status: Never Smoker   . Smokeless tobacco: Never Used  . Alcohol Use: Yes  . Drug Use: No  . Sexual Activity:    Partners: Male    Birth Control/ Protection: IUD     Comment: Mirena   Other Topics Concern  . Not on file   Social History Narrative   Lives with her two sons, her uncle and cousin. Separated from husband 11/2010.    No current outpatient prescriptions on file prior to visit.   Current Facility-Administered Medications on File Prior to Visit  Medication Dose Route Frequency Provider Last Rate Last Dose  . levonorgestrel (MIRENA) 20 MCG/24HR IUD   Intrauterine Once Brock Badharles A Harper, MD        No Known Allergies  Family History  Problem Relation Age of Onset  . Cancer Neg Hx   . Hypertension Mother   . Heart disease Neg Hx   . Diabetes Maternal Uncle   . Healthy Brother     x1  . Thyroid disease Mother     multinodular goiter    BP 149/78 mmHg  Pulse 125  Temp(Src) 98.2 F (36.8 C)  (Oral)  Ht 5\' 7"  (1.702 m)  Wt 218 lb (98.884 kg)  BMI 34.14 kg/m2  SpO2 99%    Review of Systems She has anxiety, itching, heat intolerance, palpitations, doe, muscle weakness, easy bruising, rhinorrhea, excessive diaphoresis, frequent BM's, and insomnia.  headache, hoarseness, polyuria.     She had gastric sleeve surgery at [facility] 2 mos ago (she has lost 63 lbs so far).   Objective:   Physical Exam VS: see vs page GEN: no distress HEAD: head: no deformity eyes: no periorbital swelling; slight bilat proptosis.   external nose and ears are normal mouth: no lesion seen.  NECK: thyroid is 5 times normal size (diffuse).   CHEST WALL: no deformity. LUNGS:  Clear to auscultation.  CV: tachycardic rate but reg rhythm, no murmur.  ABD: abdomen is soft, nontender.  no hepatosplenomegaly.  not distended.  no hernia MUSCULOSKELETAL: muscle bulk and strength are grossly normal.  no obvious joint swelling.  gait is normal and steady EXTEMITIES: no deformity.  no ulcer on the feet.  feet are of normal color and temp.  no edema PULSES: dorsalis pedis intact bilat.  no carotid bruit NEURO:  cn 2-12 grossly  intact.   readily moves all 4's.  sensation is intact to touch on the feet.  Moderate tremor of the hands. SKIN:  Normal texture and temperature.  No rash or suspicious lesion is visible.  Not diaphoretic NODES:  None palpable at the neck PSYCH: alert, well-oriented.  Does not appear anxious nor depressed.    I have reviewed outside records, and summarized: Pt was noted to have hypothyroidism, and referred here.  outside test results are reviewed: They do not include TFT, so pt has filled out release of info  Radiol: thyroid US (08/14/09): goiter with multiple tiny nodules.      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  In view of severity, she should start with tapazole, for prompt improvement.   Weight loss, due to bariatric surgery.     Patient is advised the following: Patient Instructions  Please increase the methimazole.  [facility]  i have also sent a prescription to your pharmacy, for a pill to slow the heart rate.  if ever you have fever while taking methimazole, stop it and call us, even if the reason is obvious, because of the risk of a rare side-effect. In view of your medical condition, you should avoid pregnancy until we have decided it is safe.   Please come back for a follow-up appointment in 1 month.

## 2015-08-23 ENCOUNTER — Telehealth: Payer: Self-pay | Admitting: Endocrinology

## 2015-08-23 NOTE — Telephone Encounter (Signed)
please call patient: US shows tiny thyroid nodules

## 2015-08-26 NOTE — Telephone Encounter (Signed)
I contacted the pt and advised of note below. Pt voiced understanding.  

## 2015-09-23 ENCOUNTER — Ambulatory Visit (INDEPENDENT_AMBULATORY_CARE_PROVIDER_SITE_OTHER): Payer: 59 | Admitting: Endocrinology

## 2015-09-23 ENCOUNTER — Encounter: Payer: Self-pay | Admitting: Endocrinology

## 2015-09-23 VITALS — BP 136/84 | HR 67 | Temp 98.3°F | Ht 67.0 in | Wt 215.0 lb

## 2015-09-23 DIAGNOSIS — E0590 Thyrotoxicosis, unspecified without thyrotoxic crisis or storm: Secondary | ICD-10-CM

## 2015-09-23 LAB — TSH: TSH: 0.29 u[IU]/mL — AB (ref 0.35–4.50)

## 2015-09-23 LAB — T4, FREE: Free T4: 0.69 ng/dL (ref 0.60–1.60)

## 2015-09-23 MED ORDER — METHIMAZOLE 10 MG PO TABS: 10.0000 mg | ORAL_TABLET | Freq: Two times a day (BID) | ORAL | Status: DC

## 2015-09-23 NOTE — Progress Notes (Signed)
Subjective:    Patient ID: Judith Moore, female    DOB: 06-23-1986, 29 y.o.   MRN: [id number]  HPI Pt returns for f/u of hyperthyroidism (dx'ed 2011; she was started on tapazole in 2016;  This was chosen as rx, due to severity; she has never had thyroid imaging; she she she is not at risk for pregnancy; she has lost weight, but this is presumed contributed to by 2016 bariatric surgery).  pt states she feels well in general.  Specifically, tremor is resolved No past medical history on file.  Past Surgical History  Procedure Laterality Date  . Tonsillectomy    . Adenoidectomy    . Cesarean section  09/28/2005, 11/28/2007  . Stomach banding      Social History   Social History  . Marital Status: Married    Spouse Name: SEPARATED  . Number of Children: 2  . Years of Education: 15+   Occupational History  . 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 Smoking status: Never Smoker   . Smokeless tobacco: Never Used  . Alcohol Use: Yes  . Drug Use: No  . Sexual Activity:    Partners: Male    Birth Control/ Protection: IUD     Comment: Mirena   Other Topics Concern  . Not on file   Social History Narrative   Lives with her two sons, her uncle and cousin. Separated from husband 11/2010.    No current outpatient prescriptions on file prior to visit.   Current Facility-Administered Medications on File Prior to Visit  Medication Dose Route Frequency Provider Last Rate Last Dose  . levonorgestrel (MIRENA) 20 MCG/24HR IUD   Intrauterine Once Brock Badharles A Harper, MD        No Known Allergies  Family History  Problem Relation Age of Onset  . Cancer Neg Hx   . Hypertension Mother   . Heart disease Neg Hx   . Diabetes Maternal Uncle   . Healthy Brother     x1  . Thyroid disease Mother     multinodular goiter    BP 136/84 mmHg  Pulse 67  Temp(Src) 98.3 F (36.8 C)  Ht 5\' 7"  (1.702 m)  Wt 215 lb (97.523 kg)  BMI 33.67 kg/m2  Review of  Systems Denies fever    Objective:   Physical Exam VITAL SIGNS:  See vs page GENERAL: no distress Skin: not diaphoretic Neuro: no tremor.   Lab Results  Component Value Date   TSH 0.29* 09/23/2015      Assessment & Plan:  Hyperthyroidism: much better.  Patient is advised the following: Patient Instructions  Please stop taking the metroprolol blood tests are requested for you today.  We'll let you know about the results. if ever you have fever while taking methimazole, stop it and call us, even if the reason is obvious, because of the risk of a rare side-effect. In view of your medical condition, you should avoid pregnancy until we have decided it is safe.   Please come back for a follow-up appointment in 6 weeks.   w can consider the radioactive iodine in the future if you want.  It works like this: We would first check a thyroid "scan" (a special, but easy and painless type of thyroid x ray).  It works like this: you go to the x-ray department of the hospital to swallow a pill, which contains a miniscule amount of radiation.  You will not notice  any symptoms from this.  You will go back to the x-ray department the next day, to lie down in front of a camera.  The results of this will be sent to me.   Based on the results, i hope to order for you a treatment pill of radioactive iodine.  Although it is a larger amount of radiation, you will again notice no symptoms from this.  The pill is gone from your body in a few days (during which you should stay away from other people), but takes several months to work.  Therefore, please return here approximately 6-8 weeks after the treatment.  This treatment has been available for many years, and the only known side-effect is an underactive thyroid.  It is possible that i would eventually prescribe for you a thyroid hormone pill, which is very inexpensive.  You don't have to worry about side-effects of this thyroid hormone pill, because it is the  same molecule your thyroid makes.

## 2015-09-23 NOTE — Patient Instructions (Addendum)
Please stop taking the metroprolol blood tests are requested for you today.  We'll let you know about the results. if ever you have fever while taking methimazole, stop it and call us, even if the reason is obvious, because of the risk of a rare side-effect. In view of your medical condition, you should avoid pregnancy until we have decided it is safe.   Please come back for a follow-up appointment in 6 weeks.   w can consider the radioactive iodine in the future if you want.  It works like this: We would first check a thyroid "scan" (a special, but easy and painless type of thyroid x ray).  It works like this: you go to the x-ray department of the hospital to swallow a pill, which contains a miniscule amount of radiation.  You will not notice any symptoms from this.  You will go back to the x-ray department the next day, to lie down in front of a camera.  The results of this will be sent to me.   Based on the results, i hope to order for you a treatment pill of radioactive iodine.  Although it is a larger amount of radiation, you will again notice no symptoms from this.  The pill is gone from your body in a few days (during which you should stay away from other people), but takes several months to work.  Therefore, please return here approximately 6-8 weeks after the treatment.  This treatment has been available for many years, and the only known side-effect is an underactive thyroid.  It is possible that i would eventually prescribe for you a thyroid hormone pill, which is very inexpensive.  You don't have to worry about side-effects of this thyroid hormone pill, because it is the same molecule your thyroid makes.

## 2015-09-24 ENCOUNTER — Other Ambulatory Visit: Payer: Self-pay

## 2015-09-24 MED ORDER — METHIMAZOLE 10 MG PO TABS: 10.0000 mg | ORAL_TABLET | Freq: Two times a day (BID) | ORAL | Status: DC

## 2015-11-04 ENCOUNTER — Ambulatory Visit: Payer: 59 | Admitting: Endocrinology

## 2015-12-02 ENCOUNTER — Ambulatory Visit: Payer: 59 | Admitting: Endocrinology

## 2016-01-06 ENCOUNTER — Observation Stay (HOSPITAL_BASED_OUTPATIENT_CLINIC_OR_DEPARTMENT_OTHER)
Admission: EM | Admit: 2016-01-06 | Discharge: 2016-01-07 | Disposition: A | Discharge status: Home / Self Care | Payer: BLUE CROSS/BLUE SHIELD | Attending: Surgery | Admitting: Surgery

## 2016-01-06 ENCOUNTER — Encounter (HOSPITAL_COMMUNITY)
Admission: EM | Disposition: A | Discharge status: Home / Self Care | Payer: Self-pay | Source: Home / Self Care | Attending: Emergency Medicine

## 2016-01-06 ENCOUNTER — Emergency Department (HOSPITAL_BASED_OUTPATIENT_CLINIC_OR_DEPARTMENT_OTHER): Payer: BLUE CROSS/BLUE SHIELD

## 2016-01-06 ENCOUNTER — Encounter (HOSPITAL_BASED_OUTPATIENT_CLINIC_OR_DEPARTMENT_OTHER): Payer: Self-pay | Admitting: Emergency Medicine

## 2016-01-06 ENCOUNTER — Observation Stay (HOSPITAL_COMMUNITY): Payer: BLUE CROSS/BLUE SHIELD | Admitting: Anesthesiology

## 2016-01-06 DIAGNOSIS — Z9884 Bariatric surgery status: Secondary | ICD-10-CM | POA: Diagnosis not present

## 2016-01-06 DIAGNOSIS — Z6830 Body mass index (BMI) 30.0-30.9, adult: Secondary | ICD-10-CM | POA: Insufficient documentation

## 2016-01-06 DIAGNOSIS — E0590 Thyrotoxicosis, unspecified without thyrotoxic crisis or storm: Secondary | ICD-10-CM | POA: Insufficient documentation

## 2016-01-06 DIAGNOSIS — K3580 Unspecified acute appendicitis: Secondary | ICD-10-CM | POA: Diagnosis not present

## 2016-01-06 DIAGNOSIS — K37 Unspecified appendicitis: Secondary | ICD-10-CM | POA: Diagnosis present

## 2016-01-06 DIAGNOSIS — E669 Obesity, unspecified: Secondary | ICD-10-CM | POA: Diagnosis not present

## 2016-01-06 DIAGNOSIS — K3589 Other acute appendicitis without perforation or gangrene: Secondary | ICD-10-CM

## 2016-01-06 HISTORY — PX: LAPAROSCOPIC APPENDECTOMY: SHX408

## 2016-01-06 LAB — COMPREHENSIVE METABOLIC PANEL
ALBUMIN: 4 g/dL (ref 3.5–5.0)
ALK PHOS: 75 U/L (ref 38–126)
ALT: 13 U/L — AB (ref 14–54)
AST: 16 U/L (ref 15–41)
Anion gap: 8 (ref 5–15)
BUN: 7 mg/dL (ref 6–20)
CALCIUM: 9.1 mg/dL (ref 8.9–10.3)
CO2: 24 mmol/L (ref 22–32)
CREATININE: 0.58 mg/dL (ref 0.44–1.00)
Chloride: 106 mmol/L (ref 101–111)
GFR calc non Af Amer: >60 mL/min (ref >60)
GLUCOSE: 98 mg/dL (ref 65–99)
Potassium: 3.6 mmol/L (ref 3.5–5.1)
SODIUM: 138 mmol/L (ref 135–145)
Total Bilirubin: 0.9 mg/dL (ref 0.3–1.2)
Total Protein: 7.5 g/dL (ref 6.5–8.1)

## 2016-01-06 LAB — URINALYSIS, ROUTINE W REFLEX MICROSCOPIC
BILIRUBIN URINE: NEGATIVE
GLUCOSE, UA: NEGATIVE mg/dL
HGB URINE DIPSTICK: NEGATIVE
Ketones, ur: NEGATIVE mg/dL
Leukocytes, UA: NEGATIVE
Nitrite: NEGATIVE
PH: 7.5 (ref 5.0–8.0)
Protein, ur: NEGATIVE mg/dL
SPECIFIC GRAVITY, URINE: 1.017 (ref 1.005–1.030)

## 2016-01-06 LAB — CBC
HEMATOCRIT: 36 % (ref 36.0–46.0)
HEMATOCRIT: 33 % — AB (ref 36.0–46.0)
HEMOGLOBIN: 11 g/dL — AB (ref 12.0–15.0)
Hemoglobin: 12 g/dL (ref 12.0–15.0)
MCH: 26.2 pg (ref 26.0–34.0)
MCH: 26.3 pg (ref 26.0–34.0)
MCHC: 33.3 g/dL (ref 30.0–36.0)
MCHC: 33.3 g/dL (ref 30.0–36.0)
MCV: 78.6 fL (ref 78.0–100.0)
MCV: 78.8 fL (ref 78.0–100.0)
Platelets: 300 10*3/uL (ref 150–400)
Platelets: 275 10*3/uL (ref 150–400)
RBC: 4.58 MIL/uL (ref 3.87–5.11)
RBC: 4.19 MIL/uL (ref 3.87–5.11)
RDW: 14 % (ref 11.5–15.5)
RDW: 14.1 % (ref 11.5–15.5)
WBC: 8.5 10*3/uL (ref 4.0–10.5)
WBC: 11.1 10*3/uL — AB (ref 4.0–10.5)

## 2016-01-06 LAB — PREGNANCY, URINE: PREG TEST UR: NEGATIVE

## 2016-01-06 LAB — CREATININE, SERUM: Creatinine, Ser: 0.72 mg/dL (ref 0.44–1.00)

## 2016-01-06 LAB — LIPASE, BLOOD: Lipase: 15 U/L (ref 11–51)

## 2016-01-06 SURGERY — APPENDECTOMY, LAPAROSCOPIC
Anesthesia: General | Site: Abdomen

## 2016-01-06 MED ORDER — PIPERACILLIN-TAZOBACTAM 3.375 G IVPB
3.3750 g | Freq: Three times a day (TID) | INTRAVENOUS | Status: DC
  Filled 2016-01-06: qty 50

## 2016-01-06 MED ORDER — ENOXAPARIN SODIUM 40 MG/0.4ML ~~LOC~~ SOLN
40.0000 mg | SUBCUTANEOUS | Status: DC
  Filled 2016-01-06: qty 0.4

## 2016-01-06 MED ORDER — FENTANYL CITRATE (PF) 250 MCG/5ML IJ SOLN
INTRAMUSCULAR | Status: AC
  Filled 2016-01-06: qty 5

## 2016-01-06 MED ORDER — PROPOFOL 10 MG/ML IV BOLUS
INTRAVENOUS | Status: DC | PRN
  Administered 2016-01-06: 50 mg via INTRAVENOUS
  Administered 2016-01-06: 200 mg via INTRAVENOUS

## 2016-01-06 MED ORDER — ACETAMINOPHEN 325 MG PO TABS: 650.0000 mg | ORAL_TABLET | Freq: Four times a day (QID) | ORAL | Status: DC | PRN

## 2016-01-06 MED ORDER — 0.9 % SODIUM CHLORIDE (POUR BTL) OPTIME
TOPICAL | Status: DC | PRN
  Administered 2016-01-06: 1000 mL

## 2016-01-06 MED ORDER — HEPARIN SODIUM (PORCINE) 5000 UNIT/ML IJ SOLN
5000.0000 [IU] | Freq: Three times a day (TID) | INTRAMUSCULAR | Status: DC
  Administered 2016-01-07: 5000 [IU] via SUBCUTANEOUS
  Filled 2016-01-06 (×4): qty 1

## 2016-01-06 MED ORDER — ACETAMINOPHEN 10 MG/ML IV SOLN
INTRAVENOUS | Status: DC | PRN
  Administered 2016-01-06: 1000 mg via INTRAVENOUS

## 2016-01-06 MED ORDER — HYDROMORPHONE HCL 1 MG/ML IJ SOLN
0.2500 mg | INTRAMUSCULAR | Status: DC | PRN
  Administered 2016-01-06 (×4): 0.5 mg via INTRAVENOUS

## 2016-01-06 MED ORDER — SUCCINYLCHOLINE CHLORIDE 20 MG/ML IJ SOLN
INTRAMUSCULAR | Status: DC | PRN
  Administered 2016-01-06 (×2): 100 mg via INTRAVENOUS

## 2016-01-06 MED ORDER — MORPHINE SULFATE (PF) 2 MG/ML IV SOLN: 1.0000 mg | INTRAVENOUS | Status: DC | PRN

## 2016-01-06 MED ORDER — ONDANSETRON 4 MG PO TBDP: 4.0000 mg | ORAL_TABLET | Freq: Four times a day (QID) | ORAL | Status: DC | PRN

## 2016-01-06 MED ORDER — DEXTROSE 5 % IV SOLN: 2.0000 g | INTRAVENOUS | Status: DC

## 2016-01-06 MED ORDER — ROCURONIUM BROMIDE 100 MG/10ML IV SOLN
INTRAVENOUS | Status: DC | PRN
  Administered 2016-01-06: 35 mg via INTRAVENOUS
  Administered 2016-01-06: 5 mg via INTRAVENOUS

## 2016-01-06 MED ORDER — MIDAZOLAM HCL 2 MG/2ML IJ SOLN
INTRAMUSCULAR | Status: AC
  Filled 2016-01-06: qty 2

## 2016-01-06 MED ORDER — LABETALOL HCL 5 MG/ML IV SOLN
INTRAVENOUS | Status: DC | PRN
  Administered 2016-01-06 (×3): 5 mg via INTRAVENOUS

## 2016-01-06 MED ORDER — PROMETHAZINE HCL 25 MG/ML IJ SOLN: 6.2500 mg | INTRAMUSCULAR | Status: DC | PRN

## 2016-01-06 MED ORDER — MIDAZOLAM HCL 5 MG/5ML IJ SOLN
INTRAMUSCULAR | Status: DC | PRN
  Administered 2016-01-06 (×2): 1 mg via INTRAVENOUS

## 2016-01-06 MED ORDER — ACETAMINOPHEN 650 MG RE SUPP: 650.0000 mg | Freq: Four times a day (QID) | RECTAL | Status: DC | PRN

## 2016-01-06 MED ORDER — METRONIDAZOLE IN NACL 5-0.79 MG/ML-% IV SOLN: 500.0000 mg | Freq: Three times a day (TID) | INTRAVENOUS | Status: DC

## 2016-01-06 MED ORDER — GLYCOPYRROLATE 0.2 MG/ML IJ SOLN
INTRAMUSCULAR | Status: DC | PRN
  Administered 2016-01-06: .8 mg via INTRAVENOUS

## 2016-01-06 MED ORDER — FENTANYL CITRATE (PF) 100 MCG/2ML IJ SOLN
25.0000 ug | Freq: Once | INTRAMUSCULAR | Status: AC
  Administered 2016-01-06: 25 ug via INTRAVENOUS
  Filled 2016-01-06: qty 2

## 2016-01-06 MED ORDER — SODIUM CHLORIDE 0.9 % IJ SOLN
INTRAMUSCULAR | Status: AC
  Filled 2016-01-06: qty 20

## 2016-01-06 MED ORDER — HYDROCODONE-ACETAMINOPHEN 7.5-325 MG PO TABS: 1.0000 | ORAL_TABLET | Freq: Once | ORAL | Status: DC | PRN

## 2016-01-06 MED ORDER — DEXAMETHASONE SODIUM PHOSPHATE 10 MG/ML IJ SOLN
INTRAMUSCULAR | Status: AC
  Filled 2016-01-06: qty 1

## 2016-01-06 MED ORDER — ONDANSETRON HCL 4 MG/2ML IJ SOLN: 4.0000 mg | Freq: Four times a day (QID) | INTRAMUSCULAR | Status: DC | PRN

## 2016-01-06 MED ORDER — LIDOCAINE HCL (CARDIAC) 20 MG/ML IV SOLN
INTRAVENOUS | Status: AC
  Filled 2016-01-06: qty 5

## 2016-01-06 MED ORDER — ONDANSETRON HCL 4 MG/2ML IJ SOLN
INTRAMUSCULAR | Status: DC | PRN
  Administered 2016-01-06: 4 mg via INTRAVENOUS

## 2016-01-06 MED ORDER — HYDROMORPHONE HCL 1 MG/ML IJ SOLN
INTRAMUSCULAR | Status: AC
  Filled 2016-01-06: qty 1

## 2016-01-06 MED ORDER — PIPERACILLIN-TAZOBACTAM 3.375 G IVPB 30 MIN: 3.3750 g | Freq: Three times a day (TID) | INTRAVENOUS | Status: DC

## 2016-01-06 MED ORDER — PIPERACILLIN-TAZOBACTAM 3.375 G IVPB
3.3750 g | Freq: Once | INTRAVENOUS | Status: AC
  Administered 2016-01-06: 3.375 g via INTRAVENOUS
  Filled 2016-01-06: qty 50

## 2016-01-06 MED ORDER — IOPAMIDOL (ISOVUE-300) INJECTION 61%
100.0000 mL | Freq: Once | INTRAVENOUS | Status: AC | PRN
  Administered 2016-01-06: 100 mL via INTRAVENOUS

## 2016-01-06 MED ORDER — FENTANYL CITRATE (PF) 100 MCG/2ML IJ SOLN
INTRAMUSCULAR | Status: DC | PRN
  Administered 2016-01-06 (×2): 50 ug via INTRAVENOUS
  Administered 2016-01-06: 100 ug via INTRAVENOUS
  Administered 2016-01-06: 50 ug via INTRAVENOUS
  Administered 2016-01-06: 100 ug via INTRAVENOUS

## 2016-01-06 MED ORDER — PIPERACILLIN-TAZOBACTAM 3.375 G IVPB
INTRAVENOUS | Status: AC
  Filled 2016-01-06: qty 50

## 2016-01-06 MED ORDER — ONDANSETRON HCL 4 MG/2ML IJ SOLN
4.0000 mg | Freq: Once | INTRAMUSCULAR | Status: AC
  Administered 2016-01-06: 4 mg via INTRAVENOUS
  Filled 2016-01-06: qty 2

## 2016-01-06 MED ORDER — PROPOFOL 10 MG/ML IV BOLUS
INTRAVENOUS | Status: AC
  Filled 2016-01-06: qty 40

## 2016-01-06 MED ORDER — HEPARIN SODIUM (PORCINE) 5000 UNIT/ML IJ SOLN
5000.0000 [IU] | Freq: Three times a day (TID) | INTRAMUSCULAR | Status: DC
  Filled 2016-01-06 (×2): qty 1

## 2016-01-06 MED ORDER — KCL IN DEXTROSE-NACL 20-5-0.45 MEQ/L-%-% IV SOLN
INTRAVENOUS | Status: DC
  Administered 2016-01-06: 18:00:00 via INTRAVENOUS
  Filled 2016-01-06 (×3): qty 1000

## 2016-01-06 MED ORDER — LIDOCAINE HCL (CARDIAC) 20 MG/ML IV SOLN
INTRAVENOUS | Status: DC | PRN
  Administered 2016-01-06: 75 mg via INTRAVENOUS
  Administered 2016-01-06: 50 mg via INTRATRACHEAL

## 2016-01-06 MED ORDER — HYDROMORPHONE HCL 1 MG/ML IJ SOLN
1.0000 mg | INTRAMUSCULAR | Status: DC | PRN
Start: 2016-01-06 — End: 2016-01-06
  Administered 2016-01-06: 1 mg via INTRAVENOUS
  Filled 2016-01-06 (×2): qty 1

## 2016-01-06 MED ORDER — BUPIVACAINE-EPINEPHRINE 0.25% -1:200000 IJ SOLN
INTRAMUSCULAR | Status: AC
  Filled 2016-01-06: qty 1

## 2016-01-06 MED ORDER — METHIMAZOLE 10 MG PO TABS
10.0000 mg | ORAL_TABLET | Freq: Two times a day (BID) | ORAL | Status: DC
  Filled 2016-01-06 (×2): qty 1

## 2016-01-06 MED ORDER — LACTATED RINGERS IV SOLN
INTRAVENOUS | Status: DC
  Administered 2016-01-06: 1000 mL via INTRAVENOUS

## 2016-01-06 MED ORDER — KETOROLAC TROMETHAMINE 30 MG/ML IJ SOLN
INTRAMUSCULAR | Status: AC
  Filled 2016-01-06: qty 1

## 2016-01-06 MED ORDER — ACETAMINOPHEN 10 MG/ML IV SOLN
INTRAVENOUS | Status: AC
  Filled 2016-01-06: qty 100

## 2016-01-06 MED ORDER — KETOROLAC TROMETHAMINE 30 MG/ML IJ SOLN
30.0000 mg | Freq: Once | INTRAMUSCULAR | Status: AC
  Administered 2016-01-06: 30 mg via INTRAVENOUS

## 2016-01-06 MED ORDER — PIPERACILLIN-TAZOBACTAM 3.375 G IVPB 30 MIN
3.3750 g | Freq: Three times a day (TID) | INTRAVENOUS | Status: AC
  Administered 2016-01-06: 3.375 g via INTRAVENOUS
  Filled 2016-01-06: qty 50

## 2016-01-06 MED ORDER — LABETALOL HCL 5 MG/ML IV SOLN
INTRAVENOUS | Status: AC
  Filled 2016-01-06: qty 4

## 2016-01-06 MED ORDER — PROPOFOL 10 MG/ML IV BOLUS
INTRAVENOUS | Status: AC
  Filled 2016-01-06: qty 20

## 2016-01-06 MED ORDER — BUPIVACAINE-EPINEPHRINE (PF) 0.25% -1:200000 IJ SOLN
INTRAMUSCULAR | Status: DC | PRN
  Administered 2016-01-06: 10 mL via PERINEURAL

## 2016-01-06 MED ORDER — LACTATED RINGERS IR SOLN
Status: DC | PRN
  Administered 2016-01-06: 1000 mL

## 2016-01-06 MED ORDER — ONDANSETRON HCL 4 MG/2ML IJ SOLN
INTRAMUSCULAR | Status: AC
  Filled 2016-01-06: qty 2

## 2016-01-06 MED ORDER — DEXTROSE-NACL 5-0.9 % IV SOLN
INTRAVENOUS | Status: DC
  Administered 2016-01-06: 13:00:00 via INTRAVENOUS

## 2016-01-06 MED ORDER — GI COCKTAIL ~~LOC~~
30.0000 mL | Freq: Once | ORAL | Status: AC
  Administered 2016-01-06: 30 mL via ORAL
  Filled 2016-01-06: qty 30

## 2016-01-06 MED ORDER — NEOSTIGMINE METHYLSULFATE 10 MG/10ML IV SOLN
INTRAVENOUS | Status: DC | PRN
  Administered 2016-01-06: 5 mg via INTRAVENOUS

## 2016-01-06 MED ORDER — OXYCODONE-ACETAMINOPHEN 5-325 MG PO TABS: 1.0000 | ORAL_TABLET | ORAL | Status: DC | PRN

## 2016-01-06 MED ORDER — DEXAMETHASONE SODIUM PHOSPHATE 10 MG/ML IJ SOLN
INTRAMUSCULAR | Status: DC | PRN
  Administered 2016-01-06: 10 mg via INTRAVENOUS

## 2016-01-06 MED ORDER — HYDROCODONE-ACETAMINOPHEN 5-325 MG PO TABS: 1.0000 | ORAL_TABLET | ORAL | Status: DC | PRN

## 2016-01-06 MED ORDER — LACTATED RINGERS IV SOLN
INTRAVENOUS | Status: DC | PRN
  Administered 2016-01-06 (×2): via INTRAVENOUS

## 2016-01-06 SURGICAL SUPPLY — 44 items
APPLIER CLIP ROT 10 11.4 M/L (STAPLE)
APR CLP MED LRG 11.4X10 (STAPLE)
BAG SPEC RTRVL 10 TROC 200 (ENDOMECHANICALS)
BAG SPEC RTRVL LRG 6X4 10 (ENDOMECHANICALS) ×1
CABLE HIGH FREQUENCY MONO STRZ (ELECTRODE) ×2 IMPLANT
CLIP APPLIE ROT 10 11.4 M/L (STAPLE) IMPLANT
COVER SURGICAL LIGHT HANDLE (MISCELLANEOUS) ×1 IMPLANT
CUTTER FLEX LINEAR 45M (STAPLE) ×2 IMPLANT
DECANTER SPIKE VIAL GLASS SM (MISCELLANEOUS) ×3 IMPLANT
DEVICE TROCAR PUNCTURE CLOSURE (ENDOMECHANICALS) ×2 IMPLANT
DRAPE LAPAROSCOPIC ABDOMINAL (DRAPES) ×3 IMPLANT
ELECT REM PT RETURN 9FT ADLT (ELECTROSURGICAL) ×3
ELECTRODE REM PT RTRN 9FT ADLT (ELECTROSURGICAL) ×1 IMPLANT
ENDOLOOP SUT PDS II  0 18 (SUTURE)
ENDOLOOP SUT PDS II 0 18 (SUTURE) IMPLANT
GLOVE BIOGEL M 8.0 STRL (GLOVE) ×3 IMPLANT
GOWN STRL REUS W/TWL XL LVL3 (GOWN DISPOSABLE) ×8 IMPLANT
HOLDER FOLEY CATH W/STRAP (MISCELLANEOUS) ×2 IMPLANT
KIT BASIN OR (CUSTOM PROCEDURE TRAY) ×3 IMPLANT
LIQUID BAND (GAUZE/BANDAGES/DRESSINGS) ×2 IMPLANT
POUCH RETRIEVAL ECOSAC 10 (ENDOMECHANICALS) IMPLANT
POUCH RETRIEVAL ECOSAC 10MM (ENDOMECHANICALS)
POUCH SPECIMEN RETRIEVAL 10MM (ENDOMECHANICALS) ×2 IMPLANT
RELOAD 45 VASCULAR/THIN (ENDOMECHANICALS) IMPLANT
RELOAD STAPLE 45 2.5 WHT GRN (ENDOMECHANICALS) IMPLANT
RELOAD STAPLE 45 3.5 BLU ETS (ENDOMECHANICALS) IMPLANT
RELOAD STAPLE TA45 3.5 REG BLU (ENDOMECHANICALS) ×3 IMPLANT
SCISSORS LAP 5X45 EPIX DISP (ENDOMECHANICALS) ×2 IMPLANT
SCRUB PCMX 4 OZ (MISCELLANEOUS) ×3 IMPLANT
SET IRRIG TUBING LAPAROSCOPIC (IRRIGATION / IRRIGATOR) ×3 IMPLANT
SHEARS HARMONIC ACE PLUS 45CM (MISCELLANEOUS) ×3 IMPLANT
SLEEVE XCEL OPT CAN 5 100 (ENDOMECHANICALS) ×2 IMPLANT
STAPLER VISISTAT 35W (STAPLE) ×2 IMPLANT
SUT VIC AB 2-0 SH 27 (SUTURE) ×3
SUT VIC AB 2-0 SH 27X BRD (SUTURE) IMPLANT
SUT VIC AB 4-0 SH 18 (SUTURE) ×3 IMPLANT
TOWEL OR 17X26 10 PK STRL BLUE (TOWEL DISPOSABLE) ×4 IMPLANT
TRAY FOLEY W/METER SILVER 14FR (SET/KITS/TRAYS/PACK) ×3 IMPLANT
TRAY LAPAROSCOPIC (CUSTOM PROCEDURE TRAY) ×3 IMPLANT
TROCAR BLADELESS OPT 5 100 (ENDOMECHANICALS) ×3 IMPLANT
TROCAR XCEL 12X100 BLDLESS (ENDOMECHANICALS) ×2 IMPLANT
TROCAR XCEL BLUNT TIP 100MML (ENDOMECHANICALS) ×1 IMPLANT
TROCAR XCEL NON-BLD 11X100MML (ENDOMECHANICALS) IMPLANT
TUBING INSUF HEATED (TUBING) ×2 IMPLANT

## 2016-01-06 NOTE — Brief Op Note (Signed)
01/06/2016  4:54 PM  PATIENT:  Judith Moore  30 y.o. female  PRE-OPERATIVE DIAGNOSIS:  appendicitis  POST-OPERATIVE DIAGNOSIS:  appendicitis  PROCEDURE:  Procedure(s): APPENDECTOMY LAPAROSCOPIC (N/A)  SURGEON:  Surgeon(s) and Role:    * Luretha MurphyMatthew Danyel Tobey, MD - Primary  PHYSICIAN ASSISTANT:   ASSISTANTS: Madelin RearJosh Rickey, MD   ANESTHESIA:   general  EBL:  Total I/O In: 1400 [I.V.:1400] Out: 700 [Urine:700]  BLOOD ADMINISTERED:none  DRAINS: none   LOCAL MEDICATIONS USED:  MARCAINE     SPECIMEN:  Source of Specimen:  appendix  DISPOSITION OF SPECIMEN:  PATHOLOGY  COUNTS:  YES  TOURNIQUET:  * No tourniquets in log *  DICTATION: .Other Dictation: Dictation Number [id number]  PLAN OF CARE: Admit to inpatient   PATIENT DISPOSITION:  PACU - hemodynamically stable.   Delay start of Pharmacological VTE agent (>24hrs) due to surgical blood loss or risk of bleeding: no

## 2016-01-06 NOTE — ED Notes (Signed)
Was exercising yesterday and felt something "pop" upper abd.  Has been very uncomfortable since then

## 2016-01-06 NOTE — Transfer of Care (Signed)
Immediate Anesthesia Transfer of Care Note  Patient: Judith Moore  Procedure(s) Performed: Procedure(s): APPENDECTOMY LAPAROSCOPIC (N/A)  Patient Location: PACU  Anesthesia Type:General  Level of Consciousness: awake, alert , oriented and patient cooperative  Airway & Oxygen Therapy: Patient Spontanous Breathing and Patient connected to face mask oxygen  Post-op Assessment: Report given to RN, Post -op Vital signs reviewed and stable and Patient moving all extremities X 4  Post vital signs: stable  Last Vitals:  Filed Vitals:   01/06/16 1210 01/06/16 1357  BP: 139/84 130/64  Pulse: 85 81  Temp: 37.3 C 37.1 C  Resp: 20 18    Complications: No apparent anesthesia complications

## 2016-01-06 NOTE — Anesthesia Procedure Notes (Signed)
Procedure Name: Intubation Date/Time: 01/06/2016 3:35 PM Performed by: Illene SilverEVANS, Nezzie Manera E Pre-anesthesia Checklist: Patient identified, Emergency Drugs available, Suction available and Patient being monitored Patient Re-evaluated:Patient Re-evaluated prior to inductionOxygen Delivery Method: Circle System Utilized Preoxygenation: Pre-oxygenation with 100% oxygen Intubation Type: IV induction Ventilation: Mask ventilation without difficulty Laryngoscope Size: Mac and 3 Grade View: Grade II Tube type: Oral Tube size: 7.0 mm Number of attempts: 1 Airway Equipment and Method: Stylet and Oral airway Placement Confirmation: ETT inserted through vocal cords under direct vision,  positive ETCO2 and breath sounds checked- equal and bilateral Secured at: 22 cm Tube secured with: Tape Dental Injury: Teeth and Oropharynx as per pre-operative assessment

## 2016-01-06 NOTE — Progress Notes (Signed)
[facility] [id number].  Oriented to unit, staff, room, call bell and phone.  Patient is alert and oriented x4, in no acute distress with family at bedside.  Consent signed and placed in patient chart.  Patient assessed and vitals obtained and documented in Flowsheets.  Patient denies needs or concerns at this time, waiting for surgery.  Will continue to monitor.

## 2016-01-06 NOTE — H&P (Signed)
Chief Complaint: abdominal pain HPI: Judith Moore is a 30 year old female with a history of laparoscopic sleeve gastrectomy by Dr. Florene Glen at [facility] on 06/17/16 and hyperthyroidism who presents from [facility] with acute appendicitis.  The patient reports developing epigastric abdominal pain last evening after eating waffle french fries.  Associated with nausea, chills and sweats.  The pain was constant, throbbing.  No aggravating or alleviating factors.  Modifying factors include; gas x without much help.  Her pain persisted and the patient was seen at [facility].  A CT of abdomen and pelvis revealed a dilated appendix with wall thickening and mesenteric stranding.  WBC was normal, LFTs were normal and UA was negative.  She was transferred for surgical evaluation.   Last PO intake was yesterday.   History reviewed. No pertinent past medical history.  Past Surgical History  Procedure Laterality Date  . Tonsillectomy    . Adenoidectomy    . Cesarean section  09/28/2005, 11/28/2007  . Stomach banding      Family History  Problem Relation Age of Onset  . Cancer Neg Hx   . Hypertension Mother   . Heart disease Neg Hx   . Diabetes Maternal Uncle   . Healthy Brother     x1  . Thyroid disease Mother     multinodular goiter   Social History:  reports that she has never smoked. She has never used smokeless tobacco. She reports that she drinks alcohol. She reports that she does not use illicit drugs.  Allergies:  Allergies  Allergen Reactions  . Almond (Diagnostic) Hives and Swelling    Almonds     (Not in a hospital admission)  Results for orders placed or performed during the hospital encounter of 01/06/16 (from the past 48 hour(s))  Pregnancy, urine     Status: None   Collection Time: 01/06/16  8:45 AM  Result Value Ref Range   Preg Test, Ur NEGATIVE NEGATIVE    Comment:        THE SENSITIVITY OF THIS METHODOLOGY IS >20 mIU/mL.   Urinalysis, Routine w reflex microscopic (not at  [facility])     Status: None   Collection Time: 01/06/16  8:45 AM  Result Value Ref Range   Color, Urine YELLOW YELLOW   APPearance CLEAR CLEAR   Specific Gravity, Urine 1.017 1.005 - 1.030   pH 7.5 5.0 - 8.0   Glucose, UA NEGATIVE NEGATIVE mg/dL   Hgb urine dipstick NEGATIVE NEGATIVE   Bilirubin Urine NEGATIVE NEGATIVE   Ketones, ur NEGATIVE NEGATIVE mg/dL   Protein, ur NEGATIVE NEGATIVE mg/dL   Nitrite NEGATIVE NEGATIVE   Leukocytes, UA NEGATIVE NEGATIVE    Comment: MICROSCOPIC NOT DONE ON URINES WITH NEGATIVE PROTEIN, BLOOD, LEUKOCYTES, NITRITE, OR GLUCOSE <1000 mg/dL.  CBC     Status: None   Collection Time: 01/06/16  8:47 AM  Result Value Ref Range   WBC 8.5 4.0 - 10.5 K/uL   RBC 4.58 3.87 - 5.11 MIL/uL   Hemoglobin 12.0 12.0 - 15.0 g/dL   HCT 36.0 36.0 - 46.0 %   MCV 78.6 78.0 - 100.0 fL   MCH 26.2 26.0 - 34.0 pg   MCHC 33.3 30.0 - 36.0 g/dL   RDW 14.0 11.5 - 15.5 %   Platelets 300 150 - 400 K/uL  Comprehensive metabolic panel     Status: Abnormal   Collection Time: 01/06/16  8:47 AM  Result Value Ref Range   Sodium 138 135 - 145 mmol/L  Potassium 3.6 3.5 - 5.1 mmol/L   Chloride 106 101 - 111 mmol/L   CO2 24 22 - 32 mmol/L   Glucose, Bld 98 65 - 99 mg/dL   BUN 7 6 - 20 mg/dL   Creatinine, Ser 0.58 0.44 - 1.00 mg/dL   Calcium 9.1 8.9 - 10.3 mg/dL   Total Protein 7.5 6.5 - 8.1 g/dL   Albumin 4.0 3.5 - 5.0 g/dL   AST 16 15 - 41 U/L   ALT 13 (L) 14 - 54 U/L   Alkaline Phosphatase 75 38 - 126 U/L   Total Bilirubin 0.9 0.3 - 1.2 mg/dL   GFR calc non Af Amer >60 >60 mL/min   GFR calc Af Amer >60 >60 mL/min    Comment: (NOTE) The eGFR has been calculated using the CKD EPI equation. This calculation has not been validated in all clinical situations. eGFR's persistently <60 mL/min signify possible Chronic Kidney Disease.    Anion gap 8 5 - 15  Lipase, blood     Status: None   Collection Time: 01/06/16  8:47 AM  Result Value Ref Range   Lipase 15 11 - 51 U/L   Ct  Abdomen Pelvis W Contrast  01/06/2016  CLINICAL DATA:  Upper abdominal discomfort for several days EXAM: CT ABDOMEN AND PELVIS WITH CONTRAST TECHNIQUE: Multidetector CT imaging of the abdomen and pelvis was performed using the standard protocol following bolus administration of intravenous contrast. CONTRAST:  152m ISOVUE-300 IOPAMIDOL (ISOVUE-300) INJECTION 61% COMPARISON:  None. FINDINGS: Lower chest:  Lung bases are clear. Hepatobiliary: Liver is prominent, measuring 20.6 cm in length. No focal liver lesions are evident. Gallbladder wall is not appreciably thickened. There does appear to be sludge in the gallbladder as well as a tiny dependent gallstone. There is no demonstrable biliary duct dilatation. Pancreas: No demonstrable pancreatic mass or inflammatory focus. Spleen: No splenic lesions are evident. Adrenals/Urinary Tract: Adrenals appear normal bilaterally. Kidneys bilaterally show no demonstrable mass or hydronephrosis on either side. There is no renal or ureteral calculus on either side. Urinary bladder is midline with wall thickness within normal limits. Stomach/Bowel: The patient has had previous gastric banding procedure. There is no fluid or wall thickening in the gastric region. There is no appreciable bowel wall thickening. No bowel obstruction. No free air or portal venous air. Vascular/Lymphatic: No abdominal aortic aneurysm. No vascular lesions are evident. There is no adenopathy in the abdomen or pelvis. Reproductive: Uterus is retroverted. There is an intrauterine device within the endometrium. There is a small amount of free fluid in the cul-de-sac. There is no intrauterine or extrauterine pelvic mass appreciable on this study. Other: The appendix is dilated to 12 mm with subtle enhancement of the wall. There is subtle mesenteric thickening adjacent to the proximal appendix. No abscess is seen. No evidence of perforation. Elsewhere in the abdomen, there is no abscess. Musculoskeletal:  There are no blastic or lytic bone lesions. No intramuscular abdominal wall lesion. IMPRESSION: The appendix is dilated with mild wall enhancement and thickening. There is subtle mesenteric stranding adjacent to the proximal appendix. These are findings indicative of a degree of appendiceal inflammation. Small amount of free fluid in the cul-de-sac could be due to recent ovarian cyst rupture but also could be secondary to the appendiceal inflammation. Small gallstone with sludge in gallbladder. No gallbladder wall thickening. Status post gastric banding procedure without complicating feature. Intrauterine device within the endometrium. Critical Value/emergent results were called by telephone at the time of interpretation on  01/06/2016 at 9:40 am to Dr. Merrily Pew , who verbally acknowledged these results. Electronically Signed   By: Lowella Grip III M.D.   On: 01/06/2016 09:41    Review of Systems  Constitutional: Positive for fever, chills and malaise/fatigue. Negative for diaphoresis.  Eyes: Negative for blurred vision, double vision, photophobia, pain, discharge and redness.  Respiratory: Negative for cough, hemoptysis, sputum production, shortness of breath and wheezing.   Cardiovascular: Negative for chest pain, palpitations, orthopnea, claudication, leg swelling and PND.  Gastrointestinal: Positive for nausea, vomiting, abdominal pain and constipation. Negative for diarrhea, blood in stool and melena.  Genitourinary: Negative for dysuria, urgency, frequency, hematuria and flank pain.  Musculoskeletal: Negative for myalgias, back pain, joint pain, falls and neck pain.  Neurological: Negative for dizziness, tingling, tremors, sensory change, speech change, focal weakness, seizures, loss of consciousness, weakness and headaches.  Psychiatric/Behavioral: Negative for depression, suicidal ideas, hallucinations and substance abuse.    Blood pressure 139/84, pulse 85, temperature 99.1 F (37.3  C), temperature source Oral, resp. rate 20, height 5' 6"  (1.676 m), weight 86.183 kg (190 lb), SpO2 100 %. Physical Exam  Constitutional: She is oriented to person, place, and time. She appears well-developed and well-nourished. No distress.  Cardiovascular: Normal rate, regular rhythm, normal heart sounds and intact distal pulses.  Exam reveals no gallop and no friction rub.   No murmur heard. Respiratory: Effort normal and breath sounds normal. No respiratory distress. She has no wheezes. She has no rales. She exhibits no tenderness.  GI: Soft. Bowel sounds are normal. She exhibits no mass. There is no rebound.  Diffuse tenderness with voluntary guarding   Musculoskeletal: Normal range of motion. She exhibits no edema or tenderness.  Neurological: She is alert and oriented to person, place, and time.  Skin: Skin is warm and dry.  Psychiatric: She has a normal mood and affect. Her behavior is normal. Judgment and thought content normal.     Assessment/Plan Acute appendicitis-to OR for a laparoscopic appendectomy.  Surgical risks discussed including infection, bleeding, injury to surrounding structures, open surgery, anesthesia risks.  The patient verbalizes understanding and wishes to proceed.   ID-zosyn already given, will continue FEN-NPO, IVF, pain control VTE prophylaxis-SCD/ post op lovenox  Hyperthyroidism-home meds Dispo-to OR, then floor   Erby Pian, NP  Pager [phone] 01/06/2016, 12:37 PM

## 2016-01-06 NOTE — ED Notes (Signed)
Per [facility], pt here from [facility].  Pt has appendicitis and here to see surgery.  Fentanyl has been given for pain.  Lap band x 2 months ago.  IV in place NSL.  Vitals: 149/99, hr96, resp 16, 99% ra

## 2016-01-06 NOTE — ED Notes (Signed)
Report given to [facility] 

## 2016-01-06 NOTE — ED Notes (Signed)
Surgery at bedside.

## 2016-01-06 NOTE — Anesthesia Preprocedure Evaluation (Signed)
Anesthesia Evaluation  Patient identified by MRN, date of birth, ID band Patient awake    Reviewed: Allergy & Precautions, NPO status , Patient's Chart, lab work & pertinent test results  Airway Mallampati: I  TM Distance: >3 FB Neck ROM: Full    Dental  (+) Dental Advisory Given   Pulmonary neg pulmonary ROS,    breath sounds clear to auscultation       Cardiovascular negative cardio ROS   Rhythm:Regular Rate:Normal     Neuro/Psych negative neurological ROS     GI/Hepatic Neg liver ROS, Acute appendicitis   Endo/Other  Hyperthyroidism   Renal/GU negative Renal ROS     Musculoskeletal   Abdominal   Peds  Hematology negative hematology ROS (+)   Anesthesia Other Findings   Reproductive/Obstetrics                             Lab Results  Component Value Date   WBC 8.5 01/06/2016   HGB 12.0 01/06/2016   HCT 36.0 01/06/2016   MCV 78.6 01/06/2016   PLT 300 01/06/2016   Lab Results  Component Value Date   CREATININE 0.58 01/06/2016   BUN 7 01/06/2016   NA 138 01/06/2016   K 3.6 01/06/2016   CL 106 01/06/2016   CO2 24 01/06/2016    Anesthesia Physical Anesthesia Plan  ASA: II  Anesthesia Plan: General   Post-op Pain Management:    Induction: Intravenous  Airway Management Planned: Oral ETT  Additional Equipment:   Intra-op Plan:   Post-operative Plan: Extubation in OR  Informed Consent: I have reviewed the patients History and Physical, chart, labs and discussed the procedure including the risks, benefits and alternatives for the proposed anesthesia with the patient or authorized representative who has indicated his/her understanding and acceptance.   Dental advisory given  Plan Discussed with: CRNA  Anesthesia Plan Comments:         Anesthesia Quick Evaluation

## 2016-01-06 NOTE — ED Provider Notes (Signed)
CSN: [id number]     Arrival date & time 01/06/16  0759 History   First MD Initiated Contact with Patient 01/06/16 [id number]     Chief Complaint  Patient presents with  . Abdominal Pain     HPI  30 y/o F with history of gastric sleeve bariatric surgery 6 months ago. She presents for epigastric pain that started yesterday after eating 5 waffle french fries. The pain started at the center of her abdomen and radiated to her central chest and back. She no longer has radiation of her abdominal pain to her chest. But the pain remains at the center of her abdomen.  She denies recent illness, fevers, chills, cough, SOB, current chest pain, emesis, diarrhea. She does report mild nausea. She had a normal bowel movement this AM  History reviewed. No pertinent past medical history. Past Surgical History  Procedure Laterality Date  . Tonsillectomy    . Adenoidectomy    . Cesarean section  09/28/2005, 11/28/2007  . Stomach banding     Family History  Problem Relation Age of Onset  . Cancer Neg Hx   . Hypertension Mother   . Heart disease Neg Hx   . Diabetes Maternal Uncle   . Healthy Brother     x1  . Thyroid disease Mother     multinodular goiter   Social History  Substance Use Topics  . Smoking status: Never Smoker   . Smokeless tobacco: Never Used  . Alcohol Use: Yes   OB History    Gravida Para Term Preterm AB TAB SAB Ectopic Multiple Living   2 2 1 1  0 0 0 0 0 2     Review of Systems  Constitutional: Negative.   HENT: Negative.   Respiratory: Negative.   Cardiovascular: Negative.   Gastrointestinal: Positive for nausea and abdominal distention. Negative for vomiting, diarrhea, constipation, blood in stool, anal bleeding and rectal pain.  Genitourinary: Negative.   Skin: Negative.     Allergies  Review of patient's allergies indicates no known allergies.  Home Medications   Prior to Admission medications   Medication Sig Start Date End Date Taking? Authorizing Provider   methimazole (TAPAZOLE) 10 MG tablet Take 1 tablet (10 mg total) by mouth 2 (two) times daily. 09/24/15  Yes Romero BellingSean Ellison, MD  Multiple Vitamins-Minerals (MULTIVITAMIN WITH MINERALS) tablet Take 1 tablet by mouth daily.   Yes Historical Provider, MD   BP 135/84 mmHg  Pulse 114  Temp(Src) 98.8 F (37.1 C) (Oral)  Resp 18  Ht 5\' 6"  (1.676 m)  Wt 86.183 kg  BMI 30.68 kg/m2  SpO2 100% Physical Exam  Constitutional: She appears well-developed.  HENT:  Head: Normocephalic.  Eyes: Pupils are equal, round, and reactive to light.  Neck: Normal range of motion.  Cardiovascular: Normal rate and regular rhythm.   No murmur heard. Pulmonary/Chest: Effort normal and breath sounds normal.  Abdominal: Soft. Bowel sounds are normal. She exhibits no distension. There is tenderness. There is no rebound and no guarding.  Obese, difusely tender, no rebound or gaurding    ED Course  Procedures (including critical care time) Labs Review Labs Reviewed  COMPREHENSIVE METABOLIC PANEL - Abnormal; Notable for the following:    ALT 13 (*)    All other components within normal limits  PREGNANCY, URINE  CBC  LIPASE, BLOOD  URINALYSIS, ROUTINE W REFLEX MICROSCOPIC (NOT AT [facility])    Imaging Review Ct Abdomen Pelvis W Contrast  01/06/2016  CLINICAL DATA:  Upper abdominal discomfort  for several days EXAM: CT ABDOMEN AND PELVIS WITH CONTRAST TECHNIQUE: Multidetector CT imaging of the abdomen and pelvis was performed using the standard protocol following bolus administration of intravenous contrast. CONTRAST:  ISOVUE-300 IOPAMIDOL (ISOVUE-300) INJECTION 61% COMPARISON:  None. FINDINGS: Lower chest:  Lung bases are clear. Hepatobiliary: Liver is prominent, measuring 20.6 cm in length. No focal liver lesions are evident. Gallbladder wall is not appreciably thickened. There does appear to be sludge in the gallbladder as well as a tiny dependent gallstone. There is no demonstrable biliary duct dilatation.  Pancreas: No demonstrable pancreatic mass or inflammatory focus. Spleen: No splenic lesions are evident. Adrenals/Urinary Tract: Adrenals appear normal bilaterally. Kidneys bilaterally show no demonstrable mass or hydronephrosis on either side. There is no renal or ureteral calculus on either side. Urinary bladder is midline with wall thickness within normal limits. Stomach/Bowel: The patient has had previous gastric banding procedure. There is no fluid or wall thickening in the gastric region. There is no appreciable bowel wall thickening. No bowel obstruction. No free air or portal venous air. Vascular/Lymphatic: No abdominal aortic aneurysm. No vascular lesions are evident. There is no adenopathy in the abdomen or pelvis. Reproductive: Uterus is retroverted. There is an intrauterine device within the endometrium. There is a small amount of free fluid in the cul-de-sac. There is no intrauterine or extrauterine pelvic mass appreciable on this study. Other: The appendix is dilated to 12 mm with subtle enhancement of the wall. There is subtle mesenteric thickening adjacent to the proximal appendix. No abscess is seen. No evidence of perforation. Elsewhere in the abdomen, there is no abscess. Musculoskeletal: There are no blastic or lytic bone lesions. No intramuscular abdominal wall lesion. IMPRESSION: The appendix is dilated with mild wall enhancement and thickening. There is subtle mesenteric stranding adjacent to the proximal appendix. These are findings indicative of a degree of appendiceal inflammation. Small amount of free fluid in the cul-de-sac could be due to recent ovarian cyst rupture but also could be secondary to the appendiceal inflammation. Small gallstone with sludge in gallbladder. No gallbladder wall thickening. Status post gastric banding procedure without complicating feature. Intrauterine device within the endometrium. Critical Value/emergent results were called by telephone at the time of  interpretation on 01/06/2016 at 9:40 am to Dr. Marily Memos , who verbally acknowledged these results. Electronically Signed   By: Bretta Bang III M.D.   On: 01/06/2016 09:41   I have personally reviewed and evaluated these images and lab results as part of my medical decision-making.   EKG Interpretation None      MDM   Final diagnoses:  Other acute appendicitis    30 y/o Female with acute appendicitis. She is hemodynamically stable in no acute distress with no evidence of appendiceal rupture. Otherwise General surgery consulted and agreed to evaluate her at [facility]. Will transfer to the [facility] long ED for surgery evaluation   Alyssa A. Kennon Rounds MD, MS Family Medicine Resident PGY-2 Pager [phone]     Bonney Aid, MD 01/06/16 1123  Marily Memos, MD 01/08/16 [phone]

## 2016-01-06 NOTE — Anesthesia Postprocedure Evaluation (Signed)
Anesthesia Post Note  Patient: Judith Moore  Procedure(s) Performed: Procedure(s) (LRB): APPENDECTOMY LAPAROSCOPIC (N/A)  Patient location during evaluation: PACU Anesthesia Type: General Level of consciousness: awake and alert Pain management: pain level controlled Vital Signs Assessment: post-procedure vital signs reviewed and stable Respiratory status: spontaneous breathing, nonlabored ventilation, respiratory function stable and patient connected to nasal cannula oxygen Cardiovascular status: blood pressure returned to baseline and stable Postop Assessment: no signs of nausea or vomiting Anesthetic complications: no    Last Vitals:  Filed Vitals:   01/06/16 1731 01/06/16 1742  BP: 108/52 123/84  Pulse: 63   Temp:  36.8 C  Resp: 16 16    Last Pain:  Filed Vitals:   01/06/16 1751  PainSc: 2                  Kennieth RadFitzgerald, Renate Danh E

## 2016-01-07 ENCOUNTER — Encounter (HOSPITAL_COMMUNITY): Payer: Self-pay | Admitting: Surgery

## 2016-01-07 MED ORDER — HYDROCODONE-ACETAMINOPHEN 5-325 MG PO TABS: 1.0000 | ORAL_TABLET | ORAL | Status: DC | PRN

## 2016-01-07 NOTE — Op Note (Addendum)
NAMDyann Kief:  Moore, Judith              ACCOUNT NO.:  [id number]  MEDICAL RECORD NO.:  [id number]  LOCATION:  1513                         [facility]  PHYSICIAN:  Thornton ParkMatthew B. Daphine DeutscherMartin, MD  DATE OF BIRTH:  May 09, 1986  DATE OF PROCEDURE: DATE OF DISCHARGE:                              OPERATIVE REPORT   PREOPERATIVE DIAGNOSIS:  Acute appendicitis.  POSTOPERATIVE DIAGNOSIS:  Acute suppurative appendicitis.  PROCEDURE:  Laparoscopic appendectomy.  SURGEON:  Thornton ParkMatthew B. Daphine DeutscherMartin, MD and Jaynie BreamJoshua Rickey, MD  ANESTHESIA:  General endotracheal.  DESCRIPTION OF PROCEDURE:  The patient was taken to room #6 and given general anesthesia.  After prepping and draping, a time-out was performed.  Access was achieved through the right upper quadrant with a varies needle.  Abdomen was insufflated and then through that opening, Optiview technique was used to get into the abdomen without difficulty. Following that, another 5 was placed near the umbilicus and then a 12 was placed in the left lower quadrant.  The patient had a previous lap sleeve gastrectomy and there were some adhesions to the midline port and these were taken down with Harmonic scalpel.  The patient was tilted and enabling us to expose the appendix.  It was a suppurative and big.  There appeared to be a fecalith proximally in it. This was dissected free and because the away it was stuck down, we dissected beneath the base and stapled across it with the 4.5-mm Ethicon stapler.  The mesentery was transected using Harmonic scalpel.  It was detached and placed in a bag.  Bleeding had been controlled.  Inspected this.  We replaced the appendix in a bag and brought it out to the left lower quadrant incision.  Marcaine was injected into the wounds and then, the skin was closed with 4-0 Vicryl and with Dermabond.  The patient tolerated the procedure well, was taken to the recovery room in satisfactory condition.     Thornton ParkMatthew B. Daphine DeutscherMartin,  MD     MBM/MEDQ  D:  01/06/2016  T:  01/07/2016  Job:  [id number]

## 2016-01-07 NOTE — Discharge Instructions (Signed)

## 2016-01-07 NOTE — Progress Notes (Signed)
Pt discharged from the unit. Pt ambulated on own to discharge area. Discharge instructions were reviewed with the pt. Letter of absence was given to the pt and instructed to take FMLA papers to follow up appointment. No questions or concerns from the pt at this time.  Daylah Sayavong W Theus Espin, RN

## 2016-01-07 NOTE — Discharge Summary (Signed)
Physician Discharge Summary  Patient ID: Judith Moore MRN: [id number] DOB/AGE: [birth year] 30 y.o.  Admit date: 01/06/2016 Discharge date: 01/07/2016  Admitting Diagnosis: Acute appendicitis   Discharge Diagnosis Patient Active Problem List   Diagnosis Date Noted  . Acute appendicitis 01/06/2016  . Appendicitis 01/06/2016  . Hyperthyroidism 09/23/2015  . Bariatric surgery status 08/22/2015  . Candida rash of groin 04/20/2013  . Otitis media 04/20/2013  . Candidiasis of vulva and vagina 04/20/2013  . Obesity 04/20/2013    Consultants none  Imaging: Ct Abdomen Pelvis W Contrast  01/06/2016  CLINICAL DATA:  Upper abdominal discomfort for several days EXAM: CT ABDOMEN AND PELVIS WITH CONTRAST TECHNIQUE: Multidetector CT imaging of the abdomen and pelvis was performed using the standard protocol following bolus administration of intravenous contrast. CONTRAST:  ISOVUE-300 IOPAMIDOL (ISOVUE-300) INJECTION 61% COMPARISON:  None. FINDINGS: Lower chest:  Lung bases are clear. Hepatobiliary: Liver is prominent, measuring 20.6 cm in length. No focal liver lesions are evident. Gallbladder wall is not appreciably thickened. There does appear to be sludge in the gallbladder as well as a tiny dependent gallstone. There is no demonstrable biliary duct dilatation. Pancreas: No demonstrable pancreatic mass or inflammatory focus. Spleen: No splenic lesions are evident. Adrenals/Urinary Tract: Adrenals appear normal bilaterally. Kidneys bilaterally show no demonstrable mass or hydronephrosis on either side. There is no renal or ureteral calculus on either side. Urinary bladder is midline with wall thickness within normal limits. Stomach/Bowel: The patient has had previous gastric banding procedure. There is no fluid or wall thickening in the gastric region. There is no appreciable bowel wall thickening. No bowel obstruction. No free air or portal venous air. Vascular/Lymphatic: No abdominal aortic  aneurysm. No vascular lesions are evident. There is no adenopathy in the abdomen or pelvis. Reproductive: Uterus is retroverted. There is an intrauterine device within the endometrium. There is a small amount of free fluid in the cul-de-sac. There is no intrauterine or extrauterine pelvic mass appreciable on this study. Other: The appendix is dilated to 12 mm with subtle enhancement of the wall. There is subtle mesenteric thickening adjacent to the proximal appendix. No abscess is seen. No evidence of perforation. Elsewhere in the abdomen, there is no abscess. Musculoskeletal: There are no blastic or lytic bone lesions. No intramuscular abdominal wall lesion. IMPRESSION: The appendix is dilated with mild wall enhancement and thickening. There is subtle mesenteric stranding adjacent to the proximal appendix. These are findings indicative of a degree of appendiceal inflammation. Small amount of free fluid in the cul-de-sac could be due to recent ovarian cyst rupture but also could be secondary to the appendiceal inflammation. Small gallstone with sludge in gallbladder. No gallbladder wall thickening. Status post gastric banding procedure without complicating feature. Intrauterine device within the endometrium. Critical Value/emergent results were called by telephone at the time of interpretation on 01/06/2016 at 9:40 am to Dr. Marily Memos , who verbally acknowledged these results. Electronically Signed   By: Bretta Bang III M.D.   On: 01/06/2016 09:41    Procedures Laparoscopic appendectomy---Dr. Daphine Deutscher  HPI: Judith Moore is a 30 year old female with a history of laparoscopic sleeve gastrectomy by Dr. Lowell Guitar at [facility] on 06/17/16 and hyperthyroidism who presents from [facility] with acute appendicitis. The patient reports developing epigastric abdominal pain last evening after eating waffle french fries. Associated with nausea, chills and sweats. The pain was constant, throbbing. No aggravating or  alleviating factors. Modifying factors include; gas x without much help. Her pain persisted and  [facility] A CT of abdomen and pelvis revealed a dilated appendix with wall thickening and mesenteric stranding. WBC was normal, LFTs were normal and UA was negative. She was transferred for surgical evaluation.   Hospital Course:  Patient was admitted and underwent procedure listed above.  Tolerated procedure well and was transferred to the floor.  Diet was advanced as tolerated.  On POD#1, the patient was voiding well, tolerating diet, ambulating well, pain well controlled, vital signs stable, incisions c/d/i and felt stable for discharge home.  Medication risks, benefits and therapeutic alternatives were reviewed with the patient.  She verbalizes understanding.  Patient will follow up in our office in 2 weeks and knows to call with questions or concerns.  Physical Exam: General:  Alert, NAD, pleasant, comfortable Abd:  Soft, ND, mild tenderness, incisions C/D/I    Medication List    TAKE these medications        CHARCOCAPS HOMEOPATHIC Caps  Take 2 capsules by mouth once as needed (gas pain.).     HYDROcodone-acetaminophen 5-325 MG tablet  Commonly known as:  NORCO/VICODIN  Take 1-2 tablets by mouth every 4 (four) hours as needed for moderate pain.     levonorgestrel 20 MCG/24HR IUD  Commonly known as:  MIRENA  1 Intra Uterine Device by Intrauterine route continuous.     methimazole 10 MG tablet  Commonly known as:  TAPAZOLE  Take 1 tablet (10 mg total) by mouth 2 (two) times daily.     multivitamin with minerals tablet  Take 1 tablet by mouth daily.     simethicone 80 MG chewable tablet  Commonly known as:  MYLICON  Chew 80 mg by mouth every 6 (six) hours as needed for flatulence.             Follow-up Information    Follow up with [facility] On 01/22/2016.   Specialty:  General Surgery   Why:  arrive by 10:45AM for a 11:15AM post  operative check up   Contact information:   [address] [place] [address] [phone]       Signed: Ashok Norrismina Kylea Berrong, [facility], [place]NP-BC [facility] [phone]  01/07/2016, 8:46 AM

## 2016-02-07 ENCOUNTER — Encounter: Payer: Self-pay | Admitting: Endocrinology

## 2016-02-07 ENCOUNTER — Ambulatory Visit (INDEPENDENT_AMBULATORY_CARE_PROVIDER_SITE_OTHER): Payer: 59 | Admitting: Endocrinology

## 2016-02-07 VITALS — BP 132/84 | HR 65 | Temp 99.0°F | Ht 67.0 in | Wt 192.0 lb

## 2016-02-07 DIAGNOSIS — E0590 Thyrotoxicosis, unspecified without thyrotoxic crisis or storm: Secondary | ICD-10-CM | POA: Diagnosis not present

## 2016-02-07 LAB — T4, FREE: FREE T4: 0.6 ng/dL (ref 0.60–1.60)

## 2016-02-07 LAB — TSH: TSH: 0.49 u[IU]/mL (ref 0.35–4.50)

## 2016-02-07 NOTE — Patient Instructions (Addendum)
blood tests are requested for you today.  We'll let you know about the results.  if ever you have fever while taking methimazole, stop it and call us, even if the reason is obvious, because of the risk of a rare side-effect. In view of your medical condition, you should avoid pregnancy until we have decided it is safe.   Please come back for a follow-up appointment in 4 months.   we can consider the radioactive iodine in the future if you want.  It works like this:  We would first check a thyroid "scan" (a special, but easy and painless type of thyroid x ray).  It works like this: you go to the x-ray department of the hospital to swallow a pill, which contains a miniscule amount of radiation.  You will not notice any symptoms from this.  You will go back to the x-ray department the next day, to lie down in front of a camera.  The results of this will be sent to me.   Based on the results, i hope to order for you a treatment pill of radioactive iodine.  Although it is a larger amount of radiation, you will again notice no symptoms from this.  The pill is gone from your body in a few days (during which you should stay away from other people), but takes several months to work.  Therefore, please return here approximately 6-8 weeks after the treatment.  This treatment has been available for many years, and the only known side-effect is an underactive thyroid.  It is possible that i would eventually prescribe for you a thyroid hormone pill, which is very inexpensive.  You don't have to worry about side-effects of this thyroid hormone pill, because it is the same molecule your thyroid makes.

## 2016-02-07 NOTE — Progress Notes (Signed)
Subjective:    Patient ID: Judith Moore, female    DOB: 04-24-1986, 30 y.o.   MRN: [id number]  HPI Pt returns for f/u of hyperthyroidism (dx'ed 2011; she was started on tapazole in 2016;  This was chosen as rx, due to severity; she has never had thyroid imaging; she she she is not at risk for pregnancy; she has lost weight, but this is presumed contributed to by 2016 bariatric surgery).  pt states she feels well in general, except tremor and anxiety have recurred.  She never misses the tapazole.   No past medical history on file.  Past Surgical History  Procedure Laterality Date  . Tonsillectomy    . Adenoidectomy    . Cesarean section  09/28/2005, 11/28/2007  . Stomach banding    . Laparoscopic appendectomy N/A 01/06/2016    Procedure: APPENDECTOMY LAPAROSCOPIC;  Surgeon: Luretha MurphyMatthew Martin, MD;  Location: [facility];  Service: General;  Laterality: N/A;    Social History   Social History  . Marital Status: Married    Spouse Name: SEPARATED  . Number of Children: 2  . Years of Education: 15+   Occupational History  . 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 Smoking status: Never Smoker   . Smokeless tobacco: Never Used  . Alcohol Use: Yes  . Drug Use: No  . Sexual Activity:    Partners: Male    Birth Control/ Protection: IUD     Comment: Mirena   Other Topics Concern  . Not on file   Social History Narrative   Lives with her two sons, her uncle and cousin. Separated from husband 11/2010.    Current Outpatient Prescriptions on File Prior to Visit  Medication Sig Dispense Refill  . levonorgestrel (MIRENA) 20 MCG/24HR IUD 1 Intra Uterine Device by Intrauterine route continuous.    . methimazole (TAPAZOLE) 10 MG tablet Take 1 tablet (10 mg total) by mouth 2 (two) times daily. 180 tablet 1  . Multiple Vitamins-Minerals (MULTIVITAMIN WITH MINERALS) tablet Take 1 tablet by mouth daily.    . simethicone (MYLICON) 80 MG chewable tablet Chew 80 mg  by mouth every 6 (six) hours as needed for flatulence.    Marland Kitchen. HYDROcodone-acetaminophen (NORCO/VICODIN) 5-325 MG tablet Take 1-2 tablets by mouth every 4 (four) hours as needed for moderate pain. (Patient not taking: Reported on 02/07/2016) 40 tablet 0   Current Facility-Administered Medications on File Prior to Visit  Medication Dose Route Frequency Provider Last Rate Last Dose  . levonorgestrel (MIRENA) 20 MCG/24HR IUD   Intrauterine Once Brock Badharles A Harper, MD        Allergies  Allergen Reactions  . Almond (Diagnostic) Hives and Swelling    Almonds    Family History  Problem Relation Age of Onset  . Cancer Neg Hx   . Hypertension Mother   . Heart disease Neg Hx   . Diabetes Maternal Uncle   . Healthy Brother     x1  . Thyroid disease Mother     multinodular goiter    BP 132/84 mmHg  Pulse 65  Temp(Src) 99 F (37.2 C) (Oral)  Ht 5\' 7"  (1.702 m)  Wt 192 lb (87.091 kg)  BMI 30.06 kg/m2  SpO2   Review of Systems She has lost more weight.     Objective:   Physical Exam VITAL SIGNS:  See vs page GENERAL: no distress NECK: thyroid is slightly enlarged (diffuse).     Lab Results  Component Value Date   TSH 0.49 02/07/2016      Assessment & Plan:  Hyperthyroidism: well-controlled.     Patient is advised the following: Patient Instructions  blood tests are requested for you today.  We'll let you know about the results.  if ever you have fever while taking methimazole, stop it and call us, even if the reason is obvious, because of the risk of a rare side-effect. In view of your medical condition, you should avoid pregnancy until we have decided it is safe.   Please come back for a follow-up appointment in 4 months.   we can consider the radioactive iodine in the future if you want.  It works like this:  We would first check a thyroid "scan" (a special, but easy and painless type of thyroid x ray).  It works like this: you go to the x-ray department of the hospital to  swallow a pill, which contains a miniscule amount of radiation.  You will not notice any symptoms from this.  You will go back to the x-ray department the next day, to lie down in front of a camera.  The results of this will be sent to me.   Based on the results, i hope to order for you a treatment pill of radioactive iodine.  Although it is a larger amount of radiation, you will again notice no symptoms from this.  The pill is gone from your body in a few days (during which you should stay away from other people), but takes several months to work.  Therefore, please return here approximately 6-8 weeks after the treatment.  This treatment has been available for many years, and the only known side-effect is an underactive thyroid.  It is possible that i would eventually prescribe for you a thyroid hormone pill, which is very inexpensive.  You don't have to worry about side-effects of this thyroid hormone pill, because it is the same molecule your thyroid makes.     addendum: Please continue the same medication.

## 2016-06-04 ENCOUNTER — Ambulatory Visit (INDEPENDENT_AMBULATORY_CARE_PROVIDER_SITE_OTHER): Payer: 59 | Admitting: Endocrinology

## 2016-06-04 VITALS — BP 138/88 | Wt 191.0 lb

## 2016-06-04 DIAGNOSIS — E0590 Thyrotoxicosis, unspecified without thyrotoxic crisis or storm: Secondary | ICD-10-CM | POA: Diagnosis not present

## 2016-06-04 NOTE — Patient Instructions (Signed)
blood tests are requested for you today.  We'll let you know about the results.  Based on the results, you probably need to resume the methimazole.  if ever you have fever while taking methimazole, stop it and call us, even if the reason is obvious, because of the risk of a rare side-effect. In view of your medical condition, you should avoid pregnancy until we have decided it is safe.   Please come back for a follow-up appointment in 3 months.   we can consider the radioactive iodine in the future if you want.

## 2016-06-04 NOTE — Progress Notes (Signed)
   Subjective:    Patient ID: Judith Moore, female    DOB: 11/27/1985, 30 y.o.   MRN: [id number]  HPI Pt returns for f/u of hyperthyroidism (dx'ed 2011; she was started on tapazole in 2016;  This was chosen as rx, due to severity, but she chooses to continue, due to cost; she has never had thyroid imaging; she she she is not at risk for pregnancy; she has lost weight, but this is presumed contributed to by 2016 bariatric surgery).  pt states she feels well in general, except for excessive diaphoresis.  She says she has not taken the tapazole in the past month.    Past Medical History:  Diagnosis Date  . Hyperthyroidism     Past Surgical History:  Procedure Laterality Date  . ADENOIDECTOMY    . CESAREAN SECTION  09/28/2005, 11/28/2007  . LAPAROSCOPIC APPENDECTOMY N/A 01/06/2016   Procedure: APPENDECTOMY LAPAROSCOPIC;  Surgeon: Luretha MurphyMatthew Martin, MD;  Location: [facility];  Service: General;  Laterality: N/A;  . stomach banding    . TONSILLECTOMY      Social History   Social History  . Marital status: Married    Spouse name: SEPARATED  . Number of children: 2  . Years of education: 15+   Occupational History  . 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 Smoking status: Never Smoker  . Smokeless tobacco: Never Used  . Alcohol use Yes  . Drug use: No  . Sexual activity: Yes    Partners: Male    Birth control/ protection: IUD     Comment: Mirena   Other Topics Concern  . Not on file   Social History Narrative   Lives with her two sons, her uncle and cousin. Separated from husband 11/2010.    Current Outpatient Prescriptions on File Prior to Visit  Medication Sig Dispense Refill  . levonorgestrel (MIRENA) 20 MCG/24HR IUD 1 Intra Uterine Device by Intrauterine route continuous.    . methimazole (TAPAZOLE) 10 MG tablet Take 1 tablet (10 mg total) by mouth 2 (two) times daily. 180 tablet 1  . Multiple Vitamins-Minerals (MULTIVITAMIN WITH MINERALS)  tablet Take 1 tablet by mouth daily.    . simethicone (MYLICON) 80 MG chewable tablet Chew 80 mg by mouth every 6 (six) hours as needed for flatulence.     Current Facility-Administered Medications on File Prior to Visit  Medication Dose Route Frequency Provider Last Rate Last Dose  . levonorgestrel (MIRENA) 20 MCG/24HR IUD   Intrauterine Once Brock Badharles A Harper, MD        Allergies  Allergen Reactions  . Almond (Diagnostic) Hives and Swelling    Almonds    Family History  Problem Relation Age of Onset  . Cancer Neg Hx   . Hypertension Mother   . Heart disease Neg Hx   . Diabetes Maternal Uncle   . Healthy Brother     x1  . Thyroid disease Mother     multinodular goiter    BP 138/88   Wt 191 lb (86.6 kg)   BMI 29.91 kg/m   Review of Systems Denies fever.  No weight change.      Objective:   Physical Exam VITAL SIGNS:  See vs page.  GENERAL: no distress.  NECK: thyroid is slightly and diffusely enlarged.  No palpable nodule.      Assessment & Plan:  Hyperthyroidism, therapy limited by noncompliance.  i'll do the best i can.

## 2016-06-05 ENCOUNTER — Ambulatory Visit: Payer: 59 | Admitting: Endocrinology

## 2016-11-15 ENCOUNTER — Other Ambulatory Visit: Payer: Self-pay | Admitting: Endocrinology

## 2016-11-15 NOTE — Telephone Encounter (Signed)
Please refill x 1 Ov is due  

## 2017-05-06 ENCOUNTER — Encounter: Payer: Self-pay | Admitting: Endocrinology

## 2017-05-06 ENCOUNTER — Ambulatory Visit (INDEPENDENT_AMBULATORY_CARE_PROVIDER_SITE_OTHER): Payer: BLUE CROSS/BLUE SHIELD | Admitting: Endocrinology

## 2017-05-06 VITALS — BP 126/78 | HR 67 | Ht 66.5 in | Wt 164.0 lb

## 2017-05-06 DIAGNOSIS — E0590 Thyrotoxicosis, unspecified without thyrotoxic crisis or storm: Secondary | ICD-10-CM

## 2017-05-06 LAB — TSH: TSH: 0.36 u[IU]/mL (ref 0.35–4.50)

## 2017-05-06 LAB — T4, FREE: Free T4: 0.94 ng/dL (ref 0.60–1.60)

## 2017-05-06 NOTE — Progress Notes (Signed)
Subjective:    Patient ID: Judith Moore, female    DOB: 01-12-1986, 31 y.o.   MRN: [id number]  HPI Pt returns for f/u of hyperthyroidism (dx'ed 2011; she was started on tapazole in 2016;  This was chosen as rx, due to severity, but she chooses to continue, due to cost; she has never had thyroid imaging; she she she is not at risk for pregnancy; she has lost weight, but this is presumed contributed to by 2016 bariatric surgery).  pt reports palpitations, anxiety, and excessive diaphoresis.  She says she has not taken the tapazole in the past 2 months.  Past Medical History:  Diagnosis Date  . Hyperthyroidism     Past Surgical History:  Procedure Laterality Date  . ADENOIDECTOMY    . CESAREAN SECTION  09/28/2005, 11/28/2007  . LAPAROSCOPIC APPENDECTOMY N/A 01/06/2016   Procedure: APPENDECTOMY LAPAROSCOPIC;  Surgeon: Luretha MurphyMatthew Martin, MD;  Location: [facility];  Service: General;  Laterality: N/A;  . stomach banding    . TONSILLECTOMY      Social History   Social History  . Marital status: Married    Spouse name: SEPARATED  . Number of children: 2  . Years of education: 15+   Occupational History  . 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 Smoking status: Never Smoker  . Smokeless tobacco: Never Used  . Alcohol use Yes  . Drug use: No  . Sexual activity: Yes    Partners: Male    Birth control/ protection: IUD     Comment: Mirena   Other Topics Concern  . Not on file   Social History Narrative   Lives with her two sons, her uncle and cousin. Separated from husband 11/2010.    Current Outpatient Prescriptions on File Prior to Visit  Medication Sig Dispense Refill  . levonorgestrel (MIRENA) 20 MCG/24HR IUD 1 Intra Uterine Device by Intrauterine route continuous.    . Multiple Vitamins-Minerals (MULTIVITAMIN WITH MINERALS) tablet Take 1 tablet by mouth daily.    . simethicone (MYLICON) 80 MG chewable tablet Chew 80 mg by mouth every 6 (six)  hours as needed for flatulence.     Current Facility-Administered Medications on File Prior to Visit  Medication Dose Route Frequency Provider Last Rate Last Dose  . levonorgestrel (MIRENA) 20 MCG/24HR IUD   Intrauterine Once Brock BadHarper, Charles A, MD        Allergies  Allergen Reactions  . Almond (Diagnostic) Hives and Swelling    Almonds    Family History  Problem Relation Age of Onset  . Cancer Neg Hx   . Hypertension Mother   . Heart disease Neg Hx   . Diabetes Maternal Uncle   . Healthy Brother        x1  . Thyroid disease Mother        multinodular goiter    BP 126/78   Pulse 67   Ht 5' 6.5" (1.689 m)   Wt 164 lb (74.4 kg)   SpO2 94%   BMI 26.07 kg/m    Review of Systems Denies fever.     Objective:   Physical Exam VITAL SIGNS:  See vs page.  GENERAL: no distress.  NECK: thyroid is approx 3 times normal size, with smooth surface.  No palpable nodule.        Assessment & Plan:  Hyperthyroidism: due for recheck Noncompliance with medication and f/u ov's  Patient Instructions  blood tests are requested for you today.  We'll let you know about the results.  Based on the results, we may need to resume the methimazole.  if ever you have fever while taking methimazole, stop it and call us, even if the reason is obvious, because of the risk of a rare side-effect. In view of your medical condition, you should avoid pregnancy until we have decided it is safe.   Please come back for a follow-up appointment in 3 months.   we can consider the radioactive iodine in the future if you want.

## 2017-05-06 NOTE — Patient Instructions (Addendum)
blood tests are requested for you today.  We'll let you know about the results.  Based on the results, we may need to resume the methimazole.  if ever you have fever while taking methimazole, stop it and call us, even if the reason is obvious, because of the risk of a rare side-effect. In view of your medical condition, you should avoid pregnancy until we have decided it is safe.   Please come back for a follow-up appointment in 3 months.   we can consider the radioactive iodine in the future if you want.

## 2017-05-21 ENCOUNTER — Encounter: Payer: Self-pay | Admitting: Family Medicine

## 2017-05-21 ENCOUNTER — Ambulatory Visit (INDEPENDENT_AMBULATORY_CARE_PROVIDER_SITE_OTHER): Payer: BLUE CROSS/BLUE SHIELD | Admitting: Family Medicine

## 2017-05-21 VITALS — BP 120/78 | HR 90 | Temp 98.2°F | Resp 12 | Ht 66.5 in | Wt 163.1 lb

## 2017-05-21 DIAGNOSIS — Z9884 Bariatric surgery status: Secondary | ICD-10-CM

## 2017-05-21 DIAGNOSIS — E569 Vitamin deficiency, unspecified: Secondary | ICD-10-CM | POA: Diagnosis not present

## 2017-05-21 DIAGNOSIS — F419 Anxiety disorder, unspecified: Secondary | ICD-10-CM | POA: Insufficient documentation

## 2017-05-21 LAB — CBC WITH DIFFERENTIAL/PLATELET
BASOS PCT: 0.3 % (ref 0.0–3.0)
Basophils Absolute: 0 10*3/uL (ref 0.0–0.1)
EOS ABS: 0.1 10*3/uL (ref 0.0–0.7)
Eosinophils Relative: 1.5 % (ref 0.0–5.0)
HEMATOCRIT: 38.2 % (ref 36.0–46.0)
Hemoglobin: 12 g/dL (ref 12.0–15.0)
LYMPHS PCT: 50.6 % — AB (ref 12.0–46.0)
Lymphs Abs: 2.4 10*3/uL (ref 0.7–4.0)
MCHC: 31.5 g/dL (ref 30.0–36.0)
MCV: 85.8 fl (ref 78.0–100.0)
MONO ABS: 0.4 10*3/uL (ref 0.1–1.0)
Monocytes Relative: 7.8 % (ref 3.0–12.0)
Neutro Abs: 1.9 10*3/uL (ref 1.4–7.7)
Neutrophils Relative %: 39.8 % — ABNORMAL LOW (ref 43.0–77.0)
Platelets: 272 10*3/uL (ref 150.0–400.0)
RBC: 4.45 Mil/uL (ref 3.87–5.11)
RDW: 13.6 % (ref 11.5–15.5)
WBC: 4.7 10*3/uL (ref 4.0–10.5)

## 2017-05-21 LAB — BASIC METABOLIC PANEL
BUN: 9 mg/dL (ref 6–23)
CHLORIDE: 107 meq/L (ref 96–112)
CO2: 29 mEq/L (ref 19–32)
Calcium: 9.2 mg/dL (ref 8.4–10.5)
Creatinine, Ser: 0.8 mg/dL (ref 0.40–1.20)
GFR: 106.99 mL/min (ref >60.00)
GLUCOSE: 86 mg/dL (ref 70–99)
POTASSIUM: 4.4 meq/L (ref 3.5–5.1)
SODIUM: 140 meq/L (ref 135–145)

## 2017-05-21 LAB — VITAMIN D 25 HYDROXY (VIT D DEFICIENCY, FRACTURES): VITD: 21.51 ng/mL — ABNORMAL LOW (ref 30.00–100.00)

## 2017-05-21 LAB — IBC PANEL
IRON: 105 ug/dL (ref 42–145)
SATURATION RATIOS: 32.9 % (ref 20.0–50.0)
TRANSFERRIN: 228 mg/dL (ref 212.0–360.0)

## 2017-05-21 LAB — VITAMIN B12: Vitamin B-12: 320 pg/mL (ref 211–911)

## 2017-05-21 MED ORDER — FLUOXETINE HCL 20 MG PO TABS: 20.0000 mg | ORAL_TABLET | Freq: Every day | ORAL | 2 refills | Status: DC

## 2017-05-21 NOTE — Patient Instructions (Signed)
A few things to remember from today's visit:   Bariatric surgery status - Plan: CBC with Differential/Platelet, Basic metabolic panel, Vitamin B1, whole blood, Vitamin B12, VITAMIN D 25 Hydroxy (Vit-D Deficiency, Fractures), IBC panel  Deficiency of multiple vitamins - Plan: CBC with Differential/Platelet, Basic metabolic panel, Vitamin B1, whole blood, Vitamin B12, VITAMIN D 25 Hydroxy (Vit-D Deficiency, Fractures), IBC panel  Anxiety disorder, unspecified type - Plan: FLUoxetine (PROZAC) 20 MG tablet  Today we started Fluoxetine , this type of medications can increase suicidal risk. This is more prevalent among children,adolecents, and young adults with major depression or other psychiatric disorders. It can also make depression worse. Most common side effects are gastrointestinal, self limited after a few weeks: diarrhea, nausea, constipation  Or diarrhea among some.  In general it is well tolerated. We will follow closely.      Please be sure medication list is accurate. If a new problem present, please set up appointment sooner than planned today.

## 2017-05-21 NOTE — Progress Notes (Signed)
HPI:   [name] is a 31 y.o. female, who is here today to establish care.  Former PCP: N/A Last preventive routine visit: 09/2016. Last month gyn prevent vitis. Next week she will have her Mirena replaced.  Chronic medical problems: Hyperthyroidism/Grave's disease (follows with Dr Everardo All), cholelithiasis (incidental finding), s/p bariatric procedure,and anxiety among some.  2 years f/u after bariatric surgery next month, she is planning on schedule. 06/2015 gastric sleeve. She does not tolerate multivitamins in general,so she is not on vit supplementation.  C/O "weird bruising" on pretibial area, intermittently for a year with no Hx of trauma,no tender. Denies gum/nose bleeding,blood in stool,or gross hematuria.  Reports "Iron checked" at her gyn office and "low."She is on Iron supplementation + Vit C, which she tolerates well.   She exercises regularly and follows a healthy diet.   Concerns today:   Anxiety attacks:She thought symptoms were caused by hyperthyroidism but last thyroid labs were in normal range,treatment was adjusted.  She has Hx of anxiety since 2010 but has been mild until recently. She has sudden onset episodes of feeling clammy, palpitations,shaking sensation,and feeling nervous. She has not identified trigger factors. Episodes last up to 3-8 hours. Crying spells. Denies suicidal thoughts. She sleeps well most of the time, sometimes she tosses and turn and attributed to anxiety.  She has to work from home sometimes because she feels nervous and feels more comfortable at home. She denies agoraphobia.  She lives with her boyfriend and her 2 children.  Lab Results  Component Value Date   TSH 0.36 05/06/2017     Review of Systems  Constitutional: Positive for fatigue. Negative for activity change, appetite change, fever and unexpected weight change.  HENT: Negative for mouth sores, nosebleeds, sore throat, trouble swallowing and voice  change.   Eyes: Negative for redness and visual disturbance.  Respiratory: Negative for cough, shortness of breath and wheezing.   Cardiovascular: Negative for chest pain, palpitations and leg swelling.  Gastrointestinal: Negative for abdominal pain, blood in stool, nausea and vomiting.       Negative for changes in bowel habits.  Endocrine: Negative for cold intolerance, heat intolerance, polydipsia, polyphagia and polyuria.  Genitourinary: Negative for decreased urine volume, dysuria and hematuria.  Musculoskeletal: Negative for gait problem and myalgias.  Skin: Negative for pallor and rash.  Neurological: Negative for syncope, weakness, numbness and headaches.  Hematological: Negative for adenopathy. Bruises/bleeds easily.  Psychiatric/Behavioral: Positive for sleep disturbance. Negative for confusion, hallucinations and suicidal ideas. The patient is nervous/anxious.       Current Outpatient Prescriptions on File Prior to Visit  Medication Sig Dispense Refill  . levonorgestrel (MIRENA) 20 MCG/24HR IUD 1 Intra Uterine Device by Intrauterine route continuous.     Current Facility-Administered Medications on File Prior to Visit  Medication Dose Route Frequency Provider Last Rate Last Dose  . levonorgestrel (MIRENA) 20 MCG/24HR IUD   Intrauterine Once Brock Bad, MD         Past Medical History:  Diagnosis Date  . Hyperthyroidism    Allergies  Allergen Reactions  . Almond (Diagnostic) Hives and Swelling    Almonds    Family History  Problem Relation Age of Onset  . Hypertension Mother   . Thyroid disease Mother        multinodular goiter  . Diabetes Maternal Uncle   . Healthy Brother        x1  . Hypertension Maternal Grandmother   . Hypertension Maternal  Grandfather   . Cancer Neg Hx   . Heart disease Neg Hx     Social History   Social History  . Marital status: Divorced    Spouse name: SEPARATED  . Number of children: 2  . Years of education: 15+    Occupational History  . 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 Smoking status: Never Smoker  . Smokeless tobacco: Never Used  . Alcohol use Yes  . Drug use: No  . Sexual activity: Yes    Partners: Male    Birth control/ protection: IUD     Comment: Mirena   Other Topics Concern  . None   Social History Narrative   Lives with her two sons, her uncle and cousin. Separated from husband 11/2010.    Vitals:   05/21/17 0849  BP: 120/78  Pulse: 90  Resp: 12  Temp: 98.2 F (36.8 C)   Body mass index is 25.93 kg/m.  Physical Exam  Nursing note and vitals reviewed. Constitutional: She is oriented to person, place, and time. She appears well-developed and well-nourished. No distress.  HENT:  Head: Normocephalic and atraumatic.  Mouth/Throat: Oropharynx is clear and moist and mucous membranes are normal.  Eyes: Pupils are equal, round, and reactive to light. Conjunctivae and EOM are normal.  Cardiovascular: Normal rate and regular rhythm.   No murmur heard. Pulses:      Dorsalis pedis pulses are 2+ on the right side, and 2+ on the left side.  Respiratory: Effort normal and breath sounds normal. No respiratory distress.  GI: Soft. She exhibits no mass. There is no hepatomegaly. There is no tenderness.  Musculoskeletal: She exhibits no edema.  Lymphadenopathy:    She has no cervical adenopathy.  Neurological: She is alert and oriented to person, place, and time. She has normal strength. She displays no tremor. No cranial nerve deficit. Coordination normal.  Skin: Skin is warm. No ecchymosis and no rash noted. No erythema.  Psychiatric: Her mood appears anxious.  Well groomed, good eye contact.     ASSESSMENT AND PLAN:   [name] was seen today for establish care.  Diagnoses and all orders for this visit:  Lab Results  Component Value Date   WBC 4.7 05/21/2017   HGB 12.0 05/21/2017   HCT 38.2 05/21/2017   MCV 85.8 05/21/2017    PLT 272.0 05/21/2017   Lab Results  Component Value Date   VITAMINB12 320 05/21/2017   Lab Results  Component Value Date   CREATININE 0.80 05/21/2017   BUN 9 05/21/2017   NA 140 05/21/2017   K 4.4 05/21/2017   CL 107 05/21/2017   CO2 29 05/21/2017    Bariatric surgery status  She may tolerate better pediatric [place] red gummy x 2 daily. Instructed to schedule appt with surgeon for follow up. Further recommendations will be given according to labs results.  -     CBC with Differential/Platelet -     Basic metabolic panel -     Vitamin B1, whole blood -     Vitamin B12 -     VITAMIN D 25 Hydroxy (Vit-D Deficiency, Fractures) -     IBC panel  Deficiency of multiple vitamins  No changes in current management, will follow labs done today and will give further recommendations accordingly.  -     CBC with Differential/Platelet -     Basic metabolic panel -     Vitamin B1, whole blood -  Vitamin B12 -     VITAMIN D 25 Hydroxy (Vit-D Deficiency, Fractures) -     IBC panel  Anxiety disorder, unspecified type  Getting worse. After discussion of pharmacologic treatment options, she agrees with trying Fluoxetine. She can start 1/2 tab and increase to 1 tab in a few days as tolerated. Instructed about warning signs. F/U in 6 weeks,before if needed.   -     FLUoxetine (PROZAC) 20 MG tablet; Take 1 tablet (20 mg total) by mouth daily.      Abdiaziz Klahn G. [place], MD  [facility]. [place] office.

## 2017-05-23 ENCOUNTER — Encounter: Payer: Self-pay | Admitting: Family Medicine

## 2017-05-28 LAB — VITAMIN B1, WHOLE BLOOD: VITAMIN B1 (THIAMINE), BLOOD: 76 nmol/L — AB (ref 78–185)

## 2017-05-30 ENCOUNTER — Encounter: Payer: Self-pay | Admitting: Family Medicine

## 2017-07-01 ENCOUNTER — Encounter: Payer: Self-pay | Admitting: Family Medicine

## 2017-07-08 NOTE — Progress Notes (Signed)
HPI:   Ms.Justis C Drabik is a 31 y.o. female, who is here today to follow on recent OV.   She was seen on 05/21/17, where she was c/o worsening anxiety. She agreed to trying Fluoxetine 20 mg daily.  Hx of anxiety since 2010,was getting worse with frequent panic attacks and crying spells. She has tolerated Fluoxetine well, she denies major side effects except for mild insomnia. Denies suicidal thoughts.  She is reporting improvement. She is dealing better with stress in general and planning on starting psychotherapy.    Today she is complaining about a few days of rhinorrhea, nasal congestion, nose and eyes itching. She has history of allergic rhinitis, currently she is taking Zyrtec 10 mg daily but does not seem to be helping. She denies fever, chills, body aches, cough, wheezing, or dyspnea.  Symptoms are exacerbated by weather changes. She denies sick contact or recent travel.   Review of Systems  Constitutional: Negative for activity change, appetite change, fatigue and unexpected weight change.  HENT: Positive for congestion, rhinorrhea and sinus pressure. Negative for hearing loss, mouth sores, nosebleeds, sinus pain, sore throat, trouble swallowing and voice change.   Respiratory: Negative for cough, chest tightness, shortness of breath and wheezing.   Cardiovascular: Negative for palpitations.  Gastrointestinal: Negative for abdominal pain, diarrhea, nausea and vomiting.  Musculoskeletal: Negative for arthralgias and myalgias.  Skin: Negative for rash.  Allergic/Immunologic: Positive for environmental allergies.  Neurological: Negative for tremors and headaches.  Psychiatric/Behavioral: Negative for confusion, hallucinations, sleep disturbance and suicidal ideas. The patient is not nervous/anxious.       Current Outpatient Prescriptions on File Prior to Visit  Medication Sig Dispense Refill  . levonorgestrel (MIRENA) 20 MCG/24HR IUD 1 Intra Uterine Device by  Intrauterine route continuous.     No current facility-administered medications on file prior to visit.      Past Medical History:  Diagnosis Date  . Hyperthyroidism    Allergies  Allergen Reactions  . Almond (Diagnostic) Hives and Swelling    Almonds    Social History   Social History  . Marital status: Divorced    Spouse name: SEPARATED  . Number of children: 2  . Years of education: 15+   Occupational History  . 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 Smoking status: Never Smoker  . Smokeless tobacco: Never Used  . Alcohol use Yes  . Drug use: No  . Sexual activity: Yes    Partners: Male    Birth control/ protection: IUD     Comment: Mirena   Other Topics Concern  . None   Social History Narrative   Lives with her two sons, her uncle and cousin. Separated from husband 11/2010.    Vitals:   07/09/17 0756  BP: 118/70  Pulse: 88  Resp: 12   Body mass index is 25.6 kg/m.   Wt Readings from Last 3 Encounters:  07/09/17 161 lb (73 kg)  05/21/17 163 lb 2 oz (74 kg)  05/06/17 164 lb (74.4 kg)      Physical Exam  Nursing note and vitals reviewed. Constitutional: She is oriented to person, place, and time. She appears well-developed. She does not appear ill. No distress.  HENT:  Head: Normocephalic and atraumatic.  Right Ear: Tympanic membrane, external ear and ear canal normal.  Left Ear: Tympanic membrane, external ear and ear canal normal.  Nose: Rhinorrhea present. Right sinus exhibits no maxillary sinus tenderness  and no frontal sinus tenderness. Left sinus exhibits no maxillary sinus tenderness and no frontal sinus tenderness.  Mouth/Throat: Oropharynx is clear and moist and mucous membranes are normal.  Hypertrophic turbinates.  Eyes: Conjunctivae are normal.  Cardiovascular: Normal rate and regular rhythm.   No murmur heard. Respiratory: Effort normal and breath sounds normal. No respiratory distress.    Lymphadenopathy:    She has no cervical adenopathy.  Neurological: She is alert and oriented to person, place, and time. She has normal strength. Coordination and gait normal.  Skin: Skin is warm. No rash noted. No erythema.  Psychiatric: She has a normal mood and affect. Her speech is normal.  Well groomed, good eye contact.      ASSESSMENT AND PLAN:  Ms. Bulah was seen today for follow-up.  Diagnoses and all orders for this visit:  Anxiety disorder, unspecified type  Improved. We discussed some side effects. Strongly recommended arranging an appointment with psychotherapy. Instructed about warning signs. She will let me know in 2-3 weeks if she feels like Fluoxetine dose needs to be increased. Follow-up in 4 months, before if needed.  -     FLUoxetine (PROZAC) 20 MG capsule; Take 1 capsule (20 mg total) by mouth daily.  Allergic rhinitis, unspecified seasonality, unspecified trigger  Flonase nasal spray recommended. Nasal irrigations with saline as needed. Other antihistaminic options OTC discussed,she can try Allegra 180 mg daily. F/U as needed.   -     fluticasone (FLONASE) 50 MCG/ACT nasal spray; Place 1 spray into both nostrils 2 (two) times daily.     Betty G. [place], MD  [facility] [address]. [place] office.

## 2017-07-09 ENCOUNTER — Encounter: Payer: Self-pay | Admitting: Family Medicine

## 2017-07-09 ENCOUNTER — Ambulatory Visit (INDEPENDENT_AMBULATORY_CARE_PROVIDER_SITE_OTHER): Payer: BLUE CROSS/BLUE SHIELD | Admitting: Family Medicine

## 2017-07-09 VITALS — BP 118/70 | HR 88 | Resp 12 | Ht 66.5 in | Wt 161.0 lb

## 2017-07-09 DIAGNOSIS — J309 Allergic rhinitis, unspecified: Secondary | ICD-10-CM | POA: Diagnosis not present

## 2017-07-09 DIAGNOSIS — F419 Anxiety disorder, unspecified: Secondary | ICD-10-CM

## 2017-07-09 MED ORDER — FLUTICASONE PROPIONATE 50 MCG/ACT NA SUSP
1.0000 | Freq: Two times a day (BID) | NASAL | 4 refills | Status: DC
Start: 2017-07-09 — End: 2018-04-29

## 2017-07-09 MED ORDER — FLUOXETINE HCL 20 MG PO CAPS: 20.0000 mg | ORAL_CAPSULE | Freq: Every day | ORAL | 1 refills | Status: DC

## 2017-07-09 NOTE — Patient Instructions (Signed)
A few things to remember from today's visit:   Anxiety disorder, unspecified type - Plan: FLUoxetine (PROZAC) 20 MG capsule  Allergic rhinitis, unspecified seasonality, unspecified trigger - Plan: fluticasone (FLONASE) 50 MCG/ACT nasal spray    No changes in Fluoxetine. Please let me know in about 2-3 weeks if we need to increase dose of fluoxetine. Arranged appointment with psychotherapist. I will see you back in 4 months, before if needed.  There are 2 forms of allergic rhinitis: . Seasonal (hay fever): Caused by an allergy to pollen and/or mold spores in the air. Pollen is the fine powder that comes from the stamen of flowering plants. It can be carried through the air and is easily inhaled. Symptoms are seasonal and usually occur in spring, late summer, and fall. Marland Kitchen Perennial: Caused by other allergens such as dust mites, pet hair or dander, or mold. Symptoms occur year-round.  Symptoms: Your symptoms can vary, depending on the severity of your allergies. Symptoms can include: Sneezing, coughing.itching (mostly eyes, nose, mouth, throat and skin),runny nose,stuffy nose.headache,pressure in the nose and cheeks,ear fullness and popping, sore throat.watery, red, or swollen eyes,dark circles under your eyes,trouble smelling, and sometimes hives.  Allergic rhinitis cannot be prevented. You can help your symptoms by avoiding the things that you are allergic, including: . Keeping windows closed. This is especially important during high-pollen seasons. . Washing your hands after petting animals. . Using dust- and mite-proof bedding and mattress covers. . Wearing glasses outside to protect your eyes. . Showering before bed to wash off allergens from hair and skin. You can also avoid things that can make your symptoms worse, such as: . aerosol sprays . air pollution . cold temperatures . humidity . irritating fumes . tobacco smoke . wind . wood smoke.   Antihistamines help reduce the  sneezing, runny nose, and itchiness of allergies. These come in pill form and as nasal sprays. Allegra,Zyrtec,or Claritin are some examples. Decongestants, such as pseudoephedrine and phenylephrine, help temporarily relieve the stuffy nose of allergies. Decongestants are found in many medicines and come as pills, nose sprays, and nose drops. They could increase heart rate and cause tachycardia and tremor. Nasal Afrin should not be used for more than 3 days because you can become dependent on them. This causes you to feel even more stopped-up when you try to quit using them.  Nasal sprays: steroids or antihistaminics. Over the counter intranasal sterids: Nasocort,Rhinocort,or Flonase.You won't notice their benefits for up to 2 weeks after starting them. Allergy shots or sublingual tablets when other treatment do not help.This is done by immunologists.    Please be sure medication list is accurate. If a new problem present, please set up appointment sooner than planned today.

## 2017-08-06 ENCOUNTER — Ambulatory Visit: Payer: BLUE CROSS/BLUE SHIELD | Admitting: Endocrinology

## 2017-09-01 ENCOUNTER — Other Ambulatory Visit: Payer: Self-pay | Admitting: Family Medicine

## 2017-09-01 DIAGNOSIS — F419 Anxiety disorder, unspecified: Secondary | ICD-10-CM

## 2017-10-15 ENCOUNTER — Encounter: Payer: Self-pay | Admitting: Endocrinology

## 2017-10-15 ENCOUNTER — Ambulatory Visit: Payer: BLUE CROSS/BLUE SHIELD | Admitting: Endocrinology

## 2017-10-15 VITALS — BP 98/72 | HR 63 | Wt 161.8 lb

## 2017-10-15 DIAGNOSIS — E0590 Thyrotoxicosis, unspecified without thyrotoxic crisis or storm: Secondary | ICD-10-CM

## 2017-10-15 LAB — T4, FREE: Free T4: 0.85 ng/dL (ref 0.60–1.60)

## 2017-10-15 LAB — TSH: TSH: 0.57 u[IU]/mL (ref 0.35–4.50)

## 2017-10-15 NOTE — Patient Instructions (Signed)
blood tests are requested for you today.  We'll let you know about the results.  Based on the results, we may need to resume the methimazole.  if ever you have fever while taking methimazole, stop it and call us, even if the reason is obvious, because of the risk of a rare side-effect. In view of your medical condition, you should avoid pregnancy until we have decided it is safe.   Please come back for a follow-up appointment in 3 months.   we can consider the radioactive iodine in the future if you want.   

## 2017-10-15 NOTE — Progress Notes (Signed)
Subjective:    Patient ID: Judith Moore, female    DOB: [birth year], 32 y.o.   MRN: [id number]  HPI Pt returns for f/u of hyperthyroidism (dx'ed 2011; she was started on tapazole in 2016;  This was chosen as rx, due to severity, but she chooses to continue, due to cost; she has never had thyroid imaging; she she she is not at risk for pregnancy; she has lost weight, but this is presumed caused by 2016 bariatric surgery; she has been off tapazole since mid-2018, due to normalization of TFT).  She has anxiety, but she relates this to her brother's recent death.  She also has mild tremor and palpitations.  Past Medical History:  Diagnosis Date  . Hyperthyroidism     Past Surgical History:  Procedure Laterality Date  . ADENOIDECTOMY    . CESAREAN SECTION  09/28/2005, 11/28/2007  . LAPAROSCOPIC APPENDECTOMY N/A 01/06/2016   Procedure: APPENDECTOMY LAPAROSCOPIC;  Surgeon: Luretha MurphyMatthew Martin, MD;  Location: [facility];  Service: General;  Laterality: N/A;  . stomach banding    . TONSILLECTOMY      Social History   Socioeconomic History  . Marital status: Divorced    Spouse name: SEPARATED  . Number of children: 2  . Years of education: 15+  . Highest education level: Not on file  Social Needs  . Financial resource strain: Not on file  . Food insecurity - worry: Not on file  . Food insecurity - inability: Not on file  . Transportation needs - medical: Not on file  . Transportation needs - non-medical: Not on file  Occupational History  . Occupation: DentistHEALTH CARE BILLING SPECIALIST    Employer: [employer]  Tobacco Use  . Smoking status: Never Smoker  . Smokeless tobacco: Never Used  Substance and Sexual Activity  . Alcohol use: Yes  . Drug use: No  . Sexual activity: Yes    Partners: Male    Birth control/protection: IUD    Comment: Mirena  Other Topics Concern  . Not on file  Social History Narrative   Lives with her two sons, her uncle and cousin. Separated from husband 11/2010.      Current Outpatient Medications on File Prior to Visit  Medication Sig Dispense Refill  . FLUoxetine (PROZAC) 20 MG capsule Take 1 capsule (20 mg total) by mouth daily. 90 capsule 1  . fluticasone (FLONASE) 50 MCG/ACT nasal spray Place 1 spray into both nostrils 2 (two) times daily. 16 g 4  . levonorgestrel (MIRENA) 20 MCG/24HR IUD 1 Intra Uterine Device by Intrauterine route continuous.     No current facility-administered medications on file prior to visit.     Allergies  Allergen Reactions  . Almond (Diagnostic) Hives and Swelling    Almonds    Family History  Problem Relation Age of Onset  . Hypertension Mother   . Thyroid disease Mother        multinodular goiter  . Diabetes Maternal Uncle   . Healthy Brother        x1  . Hypertension Maternal Grandmother   . Hypertension Maternal Grandfather   . Cancer Neg Hx   . Heart disease Neg Hx     BP 98/72 (BP Location: Left Arm, Patient Position: Sitting, Cuff Size: Normal)   Pulse 63   Wt 161 lb 12.8 oz (73.4 kg)   SpO2 98%   BMI 25.72 kg/m    Review of Systems Denies fever    Objective:   Physical Exam  VITAL SIGNS:  See vs page.  GENERAL: no distress.  NECK: thyroid is approx twice normal size, with smooth surface.  No palpable nodule.        Assessment & Plan:  Hyperthyroidism: due for recheck.   Patient Instructions  blood tests are requested for you today.  We'll let you know about the results.  Based on the results, we may need to resume the methimazole.  if ever you have fever while taking methimazole, stop it and call us, even if the reason is obvious, because of the risk of a rare side-effect.   In view of your medical condition, you should avoid pregnancy until we have decided it is safe.   Please come back for a follow-up appointment in 3 months.   we can consider the radioactive iodine in the future if you want.

## 2017-12-31 ENCOUNTER — Ambulatory Visit: Payer: Self-pay | Admitting: Family Medicine

## 2018-01-14 ENCOUNTER — Ambulatory Visit: Payer: BLUE CROSS/BLUE SHIELD | Admitting: Endocrinology

## 2018-01-14 DIAGNOSIS — Z0289 Encounter for other administrative examinations: Secondary | ICD-10-CM

## 2018-02-17 NOTE — Progress Notes (Signed)
HPI:   [name] is a 32 y.o. female, who is here today for follow up.   She was last seen on 07/09/17.  Since her last OV she has followed with Dr Ellison,endocrinologist.  She is on Fluoxetine 20 mg daily, which was started in 05/2017 because worsening anxiety. She was also planning on starting psychotherapy.  Since her last visit her brother died. She had a couple of sections with psychotherapist, which helped.  She was taking Fluoxetine tabs, well tolerated and helping. Once Fluoxetine was changed to cap,which is chapter, she started with heartburn.Problem resolved after stopping caps in 07/2017.  While she was taking medication,she was dealing with stress better. No suicidal thoughts.  She would like to resume medication.  She is s/p gastric sleeve and since she had procedure her "stomach is more sensitive."  No abdominal pain,nausea,vomiting,changes in bowel habits ,or melena.    Review of Systems  Constitutional: Negative for activity change, appetite change, fatigue and unexpected weight change.  Respiratory: Negative for shortness of breath and wheezing.   Cardiovascular: Negative for palpitations.  Gastrointestinal: Negative for abdominal pain, diarrhea, nausea and vomiting.  Skin: Negative for rash.  Neurological: Negative for weakness and headaches.  Psychiatric/Behavioral: Negative for confusion, hallucinations, sleep disturbance and suicidal ideas. The patient is not nervous/anxious.     Current Outpatient Medications on File Prior to Visit  Medication Sig Dispense Refill  . fluticasone (FLONASE) 50 MCG/ACT nasal spray Place 1 spray into both nostrils 2 (two) times daily. 16 g 4  . levonorgestrel (MIRENA) 20 MCG/24HR IUD 1 Intra Uterine Device by Intrauterine route continuous.     No current facility-administered medications on file prior to visit.      Past Medical History:  Diagnosis Date  . Hyperthyroidism    Allergies  Allergen  Reactions  . Almond (Diagnostic) Hives and Swelling    Almonds    Social History   Socioeconomic History  . Marital status: Divorced    Spouse name: SEPARATED  . Number of children: 2  . Years of education: 15+  . Highest education level: Not on file  Occupational History  . 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 Financial resource strain: Not on file  . Food insecurity:    Worry: Not on file    Inability: Not on file  . Transportation needs:    Medical: Not on file    Non-medical: Not on file  Tobacco Use  . Smoking status: Never Smoker  . Smokeless tobacco: Never Used  Substance and Sexual Activity  . Alcohol use: Yes  . Drug use: No  . Sexual activity: Yes    Partners: Male    Birth control/protection: IUD    Comment: Mirena  Lifestyle  . Physical activity:    Days per week: Not on file    Minutes per session: Not on file  . Stress: Not on file  Relationships  . Social connections:    Talks on phone: Not on file    Gets together: Not on file    Attends religious service: Not on file    Active member of club or organization: Not on file    Attends meetings of clubs or organizations: Not on file    Relationship status: Not on file  Other Topics Concern  . Not on file  Social History Narrative   Lives with her two sons, her uncle and cousin. Separated  from husband 11/2010.    Vitals:   02/18/18 0941  BP: 124/76  Pulse: 80  Resp: 12  Temp: 98.5 F (36.9 C)  SpO2: 98%   Body mass index is 26.87 kg/m.   Physical Exam  Nursing note and vitals reviewed. Constitutional: She is oriented to person, place, and time. She appears well-developed and well-nourished. No distress.  HENT:  Head: Normocephalic and atraumatic.  Mouth/Throat: Oropharynx is clear and moist and mucous membranes are normal.  Eyes: Pupils are equal, round, and reactive to light. Conjunctivae are normal.  Cardiovascular: Normal rate and  regular rhythm.  No murmur heard. Respiratory: Effort normal and breath sounds normal. No respiratory distress.  GI: Soft. She exhibits no mass. There is no hepatomegaly. There is no tenderness.  Musculoskeletal: She exhibits no edema.  Lymphadenopathy:    She has no cervical adenopathy.  Neurological: She is alert and oriented to person, place, and time. She has normal strength. Gait normal.  Skin: Skin is warm. No rash noted. No erythema.  Psychiatric: She has a normal mood and affect. Cognition and memory are normal.  Well groomed,good eye contact.      ASSESSMENT AND PLAN:   Ms. Judith Moore was seen today for follow-up.   Anxiety disorder Fluoxetine helped. She would like to resume medication but tab instead cap. Some side effects discussed. Instructed to let me know how she is doing 6 weeks after resuming medication. F/U in 6 months,before if needed.   Heartburn  Resolved after stopping Fluoxetine caps. If Fluoxetine tab causes symptom again,we will consider another anxiolytic medication.     Betty G. [place], MD  [facility]. [place] office.

## 2018-02-18 ENCOUNTER — Ambulatory Visit: Payer: BLUE CROSS/BLUE SHIELD | Admitting: Family Medicine

## 2018-02-18 ENCOUNTER — Encounter: Payer: Self-pay | Admitting: Family Medicine

## 2018-02-18 VITALS — BP 124/76 | HR 80 | Temp 98.5°F | Resp 12 | Ht 66.5 in | Wt 169.0 lb

## 2018-02-18 DIAGNOSIS — F419 Anxiety disorder, unspecified: Secondary | ICD-10-CM | POA: Diagnosis not present

## 2018-02-18 DIAGNOSIS — R12 Heartburn: Secondary | ICD-10-CM | POA: Diagnosis not present

## 2018-02-18 MED ORDER — FLUOXETINE HCL 20 MG PO TABS: 20.0000 mg | ORAL_TABLET | Freq: Every day | ORAL | 1 refills | Status: DC

## 2018-02-18 NOTE — Assessment & Plan Note (Signed)
Fluoxetine helped. She would like to resume medication but tab instead cap. Some side effects discussed. Instructed to let me know how she is doing 6 weeks after resuming medication. F/U in 6 months,before if needed.

## 2018-02-18 NOTE — Patient Instructions (Signed)
A few things to remember from today's visit:   Anxiety disorder, unspecified type - Plan: FLUoxetine (PROZAC) 20 MG tablet  Going back to tab instead cap.  Please let me know if any problem with resuming medications.   Please be sure medication list is accurate. If a new problem present, please set up appointment sooner than planned today.

## 2018-02-23 ENCOUNTER — Encounter: Payer: Self-pay | Admitting: Family Medicine

## 2018-02-28 ENCOUNTER — Other Ambulatory Visit: Payer: Self-pay | Admitting: Family Medicine

## 2018-02-28 DIAGNOSIS — F419 Anxiety disorder, unspecified: Secondary | ICD-10-CM

## 2018-02-28 MED ORDER — FLUOXETINE HCL 20 MG PO TABS: 20.0000 mg | ORAL_TABLET | Freq: Every day | ORAL | 1 refills | Status: DC

## 2018-04-26 ENCOUNTER — Ambulatory Visit: Payer: BLUE CROSS/BLUE SHIELD | Admitting: Obstetrics

## 2018-04-29 ENCOUNTER — Ambulatory Visit: Payer: BLUE CROSS/BLUE SHIELD | Admitting: Obstetrics

## 2018-04-29 ENCOUNTER — Encounter: Payer: Self-pay | Admitting: Obstetrics

## 2018-04-29 VITALS — BP 120/85 | HR 69 | Ht 66.0 in | Wt 171.7 lb

## 2018-04-29 DIAGNOSIS — Z01419 Encounter for gynecological examination (general) (routine) without abnormal findings: Secondary | ICD-10-CM | POA: Diagnosis not present

## 2018-04-29 DIAGNOSIS — Z124 Encounter for screening for malignant neoplasm of cervix: Secondary | ICD-10-CM | POA: Diagnosis not present

## 2018-04-29 DIAGNOSIS — Z113 Encounter for screening for infections with a predominantly sexual mode of transmission: Secondary | ICD-10-CM | POA: Diagnosis not present

## 2018-04-29 DIAGNOSIS — N898 Other specified noninflammatory disorders of vagina: Secondary | ICD-10-CM

## 2018-04-29 DIAGNOSIS — N946 Dysmenorrhea, unspecified: Secondary | ICD-10-CM

## 2018-04-29 DIAGNOSIS — Z1151 Encounter for screening for human papillomavirus (HPV): Secondary | ICD-10-CM

## 2018-04-29 DIAGNOSIS — J309 Allergic rhinitis, unspecified: Secondary | ICD-10-CM

## 2018-04-29 MED ORDER — CETIRIZINE HCL 10 MG PO TABS: 10.0000 mg | ORAL_TABLET | Freq: Every day | ORAL | 11 refills | Status: DC

## 2018-04-29 MED ORDER — IBUPROFEN 800 MG PO TABS: 800.0000 mg | ORAL_TABLET | Freq: Three times a day (TID) | ORAL | 5 refills | Status: DC | PRN

## 2018-04-29 MED ORDER — FLUTICASONE PROPIONATE 50 MCG/ACT NA SUSP: 1.0000 | Freq: Every day | NASAL | 4 refills | Status: DC

## 2018-04-29 NOTE — Progress Notes (Signed)
Subjective:        Judith Moore is a 32 y.o. female here for a routine exam.  Current complaints: NONE.    Personal health questionnaire:  Is patient Judith Moore Jewish, have a family history of breast and/or ovarian cancer: no Is there a family history of uterine cancer diagnosed at age < 8, gastrointestinal cancer, urinary tract cancer, family member who is a Personnel officer syndrome-associated carrier: no Is the patient overweight and hypertensive, family history of diabetes, personal history of gestational diabetes, preeclampsia or PCOS: no Is patient over 3, have PCOS,  family history of premature CHD under age 53, diabetes, smoke, have hypertension or peripheral artery disease:  no At any time, has a partner hit, kicked or otherwise hurt or frightened you?: no Over the past 2 weeks, have you felt down, depressed or hopeless?: no Over the past 2 weeks, have you felt little interest or pleasure in doing things?:no   Gynecologic History Patient's last menstrual period was 04/12/2018 (exact date). Contraception: IUD Last Pap: 2014. Results were: normal Last mammogram: N/A. Results were: N/A  Obstetric History OB History  Gravida Para Term Preterm AB Living  2 2 1 1  0 2  SAB TAB Ectopic Multiple Live Births  0 0 0 0      # Outcome Date GA Lbr Len/2nd Weight Sex Delivery Anes PTL Lv  2 Preterm           1 Term             Past Medical History:  Diagnosis Date  . Hyperthyroidism     Past Surgical History:  Procedure Laterality Date  . ADENOIDECTOMY    . CESAREAN SECTION  09/28/2005, 11/28/2007  . LAPAROSCOPIC APPENDECTOMY N/A 01/06/2016   Procedure: APPENDECTOMY LAPAROSCOPIC;  Surgeon: Luretha Murphy, MD;  Location: [facility];  Service: General;  Laterality: N/A;  . stomach banding    . TONSILLECTOMY       Current Outpatient Medications:  .  cetirizine (ZYRTEC ALLERGY) 10 MG tablet, Take 1 tablet (10 mg total) by mouth daily., Disp: 30 tablet, Rfl: 11 .  FLUoxetine  (PROZAC) 20 MG tablet, Take 1 tablet (20 mg total) by mouth daily. (Patient not taking: Reported on 04/29/2018), Disp: 90 tablet, Rfl: 1 .  fluticasone (FLONASE) 50 MCG/ACT nasal spray, Place 1 spray into both nostrils daily., Disp: 16 g, Rfl: 4 .  ibuprofen (ADVIL,MOTRIN) 800 MG tablet, Take 1 tablet (800 mg total) by mouth every 8 (eight) hours as needed., Disp: 30 tablet, Rfl: 5 .  levonorgestrel (MIRENA) 20 MCG/24HR IUD, 1 Intra Uterine Device by Intrauterine route continuous., Disp: , Rfl:  Allergies  Allergen Reactions  . Almond (Diagnostic) Hives and Swelling    Almonds    Social History   Tobacco Use  . Smoking status: Never Smoker  . Smokeless tobacco: Never Used  Substance Use Topics  . Alcohol use: Not Currently    Family History  Problem Relation Age of Onset  . Hypertension Mother   . Thyroid disease Mother        multinodular goiter  . Diabetes Maternal Uncle   . Healthy Brother        x1  . Hypertension Maternal Grandmother   . Hypertension Maternal Grandfather   . Cancer Neg Hx   . Heart disease Neg Hx       Review of Systems  Constitutional: negative for fatigue and weight loss Respiratory: negative for cough and wheezing Cardiovascular: negative for chest pain,  fatigue and palpitations Gastrointestinal: negative for abdominal pain and change in bowel habits Musculoskeletal:negative for myalgias Neurological: negative for gait problems and tremors Behavioral/Psych: negative for abusive relationship, depression Endocrine: negative for temperature intolerance    Genitourinary:negative for abnormal menstrual periods, genital lesions, hot flashes, sexual problems and vaginal discharge Integument/breast: negative for breast lump, breast tenderness, nipple discharge and skin lesion(s)    Objective:       BP 120/85   Pulse 69   Ht 5\' 6"  (1.676 m)   Wt 171 lb 11.2 oz (77.9 kg)   LMP 04/12/2018 (Exact Date)   BMI 27.71 kg/m  General:   alert  Skin:   no  rash or abnormalities  Lungs:   clear to auscultation bilaterally  Heart:   regular rate and rhythm, S1, S2 normal, no murmur, click, rub or gallop  Breasts:   normal without suspicious masses, skin or nipple changes or axillary nodes  Abdomen:  normal findings: no organomegaly, soft, non-tender and no hernia  Pelvis:  External genitalia: normal general appearance Urinary system: urethral meatus normal and bladder without fullness, nontender Vaginal: normal without tenderness, induration or masses Cervix: normal appearance Adnexa: normal bimanual exam Uterus: anteverted and non-tender, normal size   Lab Review Urine pregnancy test Labs reviewed yes Radiologic studies reviewed yes  50% of 20 min visit spent on counseling and coordination of care.   Assessment and Plan:   1. Encounter for annual routine gynecological examination  2. Screening for cervical cancer Rx: - Cytology - PAP  3. Vaginal discharge Rx: - Cervicovaginal ancillary only  4. Screening examination for STD (sexually transmitted disease) Rx: - Hepatitis B surface antigen - Hepatitis C antibody - RPR - HIV antibody  5. Allergic rhinitis, unspecified seasonality, unspecified trigger Rx: - fluticasone (FLONASE) 50 MCG/ACT nasal spray; Place 1 spray into both nostrils 2 (two) times daily.  Dispense: 16 g; Refill: 4  6. Dysmenorrhea Rx: - ibuprofen (ADVIL,MOTRIN) 800 MG tablet; Take 1 tablet (800 mg total) by mouth every 8 (eight) hours as needed.  Dispense: 30 tablet; Refill: 5     Plan:    Education reviewed: calcium supplements, depression evaluation, low fat, low cholesterol diet, safe sex/STD prevention, self breast exams and weight bearing exercise. Contraception: IUD. Follow up in: 1 month. Mirena  IUD Removal / Reinsertion with menses  Meds ordered this encounter  Medications  . fluticasone (FLONASE) 50 MCG/ACT nasal spray    Sig: Place 1 spray into both nostrils daily.    Dispense:  16 g     Refill:  4  . cetirizine (ZYRTEC ALLERGY) 10 MG tablet    Sig: Take 1 tablet (10 mg total) by mouth daily.    Dispense:  30 tablet    Refill:  11  . ibuprofen (ADVIL,MOTRIN) 800 MG tablet    Sig: Take 1 tablet (800 mg total) by mouth every 8 (eight) hours as needed.    Dispense:  30 tablet    Refill:  5   Orders Placed This Encounter  Procedures  . Hepatitis B surface antigen  . Hepatitis C antibody  . RPR  . HIV antibody    Brock BadHARLES A. Bingham Millette MD 04-29-2018

## 2018-04-29 NOTE — Progress Notes (Signed)
New patient here for Annual Exam   LMP: 04/12/2018 Contraception:Mirena was inserted 05/29/2013 due out 05/29/2018 Last pap: on file 04/20/2013 WNL   Per pt 04/08/17 WNL @ [facility]  Family Hx of Breast Cancer: No   CC: Spotting with IUD.

## 2018-04-30 LAB — HEPATITIS B SURFACE ANTIGEN: HEP B S AG: NEGATIVE

## 2018-04-30 LAB — HIV ANTIBODY (ROUTINE TESTING W REFLEX): HIV SCREEN 4TH GENERATION: NONREACTIVE

## 2018-04-30 LAB — RPR: RPR Ser Ql: NONREACTIVE

## 2018-04-30 LAB — HEPATITIS C ANTIBODY: Hep C Virus Ab: <0.1 s/co ratio (ref 0.0–0.9)

## 2018-05-03 LAB — CYTOLOGY - PAP
DIAGNOSIS: NEGATIVE
HPV: NOT DETECTED

## 2018-05-03 LAB — CERVICOVAGINAL ANCILLARY ONLY
BACTERIAL VAGINITIS: NEGATIVE
Candida vaginitis: NEGATIVE
Chlamydia: NEGATIVE
Neisseria Gonorrhea: NEGATIVE
TRICH (WINDOWPATH): NEGATIVE

## 2018-05-10 ENCOUNTER — Ambulatory Visit: Payer: BLUE CROSS/BLUE SHIELD | Admitting: Obstetrics

## 2018-05-10 ENCOUNTER — Encounter: Payer: Self-pay | Admitting: *Deleted

## 2018-05-10 ENCOUNTER — Encounter: Payer: Self-pay | Admitting: Obstetrics

## 2018-05-10 VITALS — BP 135/79 | HR 65 | Ht 66.0 in | Wt 170.8 lb

## 2018-05-10 DIAGNOSIS — Z30433 Encounter for removal and reinsertion of intrauterine contraceptive device: Secondary | ICD-10-CM | POA: Diagnosis not present

## 2018-05-10 DIAGNOSIS — N898 Other specified noninflammatory disorders of vagina: Secondary | ICD-10-CM

## 2018-05-10 MED ORDER — LEVONORGESTREL 20 MCG/24HR IU IUD
INTRAUTERINE_SYSTEM | Freq: Once | INTRAUTERINE | Status: AC
  Administered 2018-05-10: 15:00:00 via INTRAUTERINE

## 2018-05-10 NOTE — Progress Notes (Signed)
Pt here for IUD removal and re-insertion.

## 2018-05-10 NOTE — Progress Notes (Signed)
    GYNECOLOGY OFFICE PROCEDURE NOTE  Adonis Housekeeperimeka C Trosper is a 32 y.o. [id number] here for Mirena IUD removal and reinsertion. No GYN concerns.  Last pap smear was on 04-29-2018 and was normal.  IUD Removal and Reinsertion  Patient identified, informed consent performed, consent signed.   Discussed risks of irregular bleeding, cramping, infection, malpositioning or misplacement of the IUD outside the uterus which may require further procedures. Also advised to use backup contraception for one week as the risk of pregnancy is higher during the transition period of removing an IUD and replacing it with another one. Time out was performed. Speculum placed in the vagina. The strings of the IUD were grasped and pulled using ring forceps. The IUD was successfully removed in its entirety. The cervix was cleaned with Betadine x 2 and grasped anteriorly with a single tooth tenaculum.  The new Liletta IUD insertion apparatus was used to sound the uterus to 6 cm;  the IUD was then placed per manufacturer's recommendations. Strings trimmed to 3 cm. Tenaculum was removed, good hemostasis noted. Patient tolerated procedure well.   Patient was given post-procedure instructions.  She was reminded to have backup contraception for one week during this transition period between IUDs.  Patient was also asked to check IUD strings periodically and follow up in 4 weeks for IUD check.  MIRENA IUD Lot #:  [id number] Exp:  Nov  2021  Brock BadHARLES A. HARPER , MD, FACOG Obstetrician & Gynecologist, [facility] [place] 05-10-2018

## 2018-05-11 ENCOUNTER — Other Ambulatory Visit: Payer: Self-pay | Admitting: Obstetrics

## 2018-05-11 ENCOUNTER — Ambulatory Visit
Admission: RE | Admit: 2018-05-11 | Discharge: 2018-05-11 | Disposition: A | Discharge status: Home / Self Care | Payer: BLUE CROSS/BLUE SHIELD | Source: Ambulatory Visit | Attending: Obstetrics | Admitting: Obstetrics

## 2018-05-11 DIAGNOSIS — Z30433 Encounter for removal and reinsertion of intrauterine contraceptive device: Secondary | ICD-10-CM

## 2018-05-11 DIAGNOSIS — Z30432 Encounter for removal of intrauterine contraceptive device: Secondary | ICD-10-CM | POA: Diagnosis not present

## 2018-05-11 NOTE — Addendum Note (Signed)
Addended by: Hamilton CapriBURCH, Abad Manard J on: 05/11/2018 10:09 AM   Modules accepted: Orders

## 2018-05-12 LAB — CERVICOVAGINAL ANCILLARY ONLY
Bacterial vaginitis: NEGATIVE
Candida vaginitis: NEGATIVE

## 2018-05-24 ENCOUNTER — Ambulatory Visit (INDEPENDENT_AMBULATORY_CARE_PROVIDER_SITE_OTHER): Payer: BLUE CROSS/BLUE SHIELD | Admitting: Obstetrics

## 2018-05-24 ENCOUNTER — Encounter: Payer: Self-pay | Admitting: Obstetrics

## 2018-05-24 ENCOUNTER — Encounter: Payer: Self-pay | Admitting: *Deleted

## 2018-05-24 VITALS — BP 132/85 | HR 68 | Ht 66.0 in | Wt 169.8 lb

## 2018-05-24 DIAGNOSIS — Z30432 Encounter for removal of intrauterine contraceptive device: Secondary | ICD-10-CM

## 2018-05-24 DIAGNOSIS — T8332XA Displacement of intrauterine contraceptive device, initial encounter: Secondary | ICD-10-CM

## 2018-05-24 NOTE — Progress Notes (Signed)
    GYNECOLOGY OFFICE PROCEDURE NOTE  Judith Moore is a 32 y.o. [id number] here for Liletta IUD removal. No GYN concerns.  Last pap smear was on 04-29-2018 and was normal.  IUD Removal  Patient identified, informed consent performed, consent signed.  Patient was in the dorsal lithotomy position, normal external genitalia was noted.  A speculum was placed in the patient's vagina, normal discharge was noted, no lesions. The cervix was visualized, no lesions, no abnormal discharge.  The strings of the IUD were grasped and pulled using dressing forceps. The IUD was removed in its entirety.   Patient tolerated the procedure well.    Patient will use the Patch for contraception.  Routine preventative health maintenance measures emphasized.   Brock BadHARLES A. Kazzandra Desaulniers , MD, FACOG Obstetrician & Gynecologist, [facility], [facility] 05-24-2018

## 2018-05-24 NOTE — Progress Notes (Signed)
Patient is in the office for IUD follow up after U/S 05-11-18.

## 2018-05-31 ENCOUNTER — Other Ambulatory Visit: Payer: Self-pay | Admitting: Obstetrics

## 2018-05-31 ENCOUNTER — Other Ambulatory Visit: Payer: Self-pay

## 2018-05-31 DIAGNOSIS — Z30016 Encounter for initial prescription of transdermal patch hormonal contraceptive device: Secondary | ICD-10-CM

## 2018-05-31 MED ORDER — NORELGESTROMIN-ETH ESTRADIOL 150-35 MCG/24HR TD PTWK: 1.0000 | MEDICATED_PATCH | TRANSDERMAL | 12 refills | Status: DC

## 2018-05-31 MED ORDER — NORETHIN ACE-ETH ESTRAD-FE 1-20 MG-MCG(24) PO TABS: 1.0000 | ORAL_TABLET | Freq: Every day | ORAL | 11 refills | Status: DC

## 2018-07-26 ENCOUNTER — Ambulatory Visit: Payer: BLUE CROSS/BLUE SHIELD | Admitting: Family Medicine

## 2018-07-26 ENCOUNTER — Encounter: Payer: Self-pay | Admitting: Family Medicine

## 2018-07-26 ENCOUNTER — Encounter: Payer: Self-pay | Admitting: *Deleted

## 2018-07-26 VITALS — BP 120/78 | HR 72 | Temp 98.8°F | Resp 12 | Ht 66.0 in | Wt 180.1 lb

## 2018-07-26 DIAGNOSIS — M545 Low back pain, unspecified: Secondary | ICD-10-CM

## 2018-07-26 DIAGNOSIS — M5410 Radiculopathy, site unspecified: Secondary | ICD-10-CM

## 2018-07-26 MED ORDER — CELECOXIB 100 MG PO CAPS: 100.0000 mg | ORAL_CAPSULE | Freq: Two times a day (BID) | ORAL | 0 refills | Status: AC

## 2018-07-26 MED ORDER — PREDNISONE 20 MG PO TABS: ORAL_TABLET | ORAL | 0 refills | Status: AC

## 2018-07-26 MED ORDER — KETOROLAC TROMETHAMINE 60 MG/2ML IM SOLN
60.0000 mg | Freq: Once | INTRAMUSCULAR | Status: AC
  Administered 2018-07-26: 60 mg via INTRAMUSCULAR

## 2018-07-26 MED ORDER — METHOCARBAMOL 500 MG PO TABS: 500.0000 mg | ORAL_TABLET | Freq: Three times a day (TID) | ORAL | 0 refills | Status: AC | PRN

## 2018-07-26 NOTE — Patient Instructions (Addendum)
A few things to remember from today's visit:   Acute bilateral low back pain, unspecified whether sciatica present - Plan: celecoxib (CELEBREX) 100 MG capsule, methocarbamol (ROBAXIN) 500 MG tablet  Radicular pain - Plan: predniSONE (DELTASONE) 20 MG tablet    Back pain is very common in adults.The cause of back pain is rarely dangerous and the pain often gets better over time even with no pharmacologic treatment.  The cause of your back pain may not be known. Some common causes of back pain include: 1. Strain of the muscles or ligaments supporting the spine. 2. Wear and tear (degeneration) of the spinal disks. 3. Arthritis. 4. Direct injury to the back.  For many people, back pain may return. Since back pain is rarely dangerous, most people can learn to manage this condition on their own.  HOME CARE INSTRUCTIONS Watch your back pain for any changes. The following actions may help to lessen any discomfort you are feeling:  1. Remain active. It is stressful on your back to sit or stand in one place for long periods of time. Do not sit, drive, or stand in one place for more than 30 minutes at a time. Take short walks on even surfaces as soon as you are able.Try to increase the length of time you walk each day.  2. Exercise regularly as directed by your health care provider. Exercise helps your back heal faster. It also helps avoid future injury by keeping your muscles strong and flexible.  3. Do not stay in bed.Resting more than 1-2 days can delay your recovery.                                                      4. Pay attention to your body when you bend and lift. The most comfortable positions are those that put less stress on your recovering back.  5.  Always use proper lifting techniques, including: Bending your knees. Keeping the load close to your body. Avoiding twisting.  6. Find a comfortable position to sleep. Use a firm mattress and lie on your side with your knees  slightly bent. If you lie on your back, put a pillow under your knees.  7. Over the counter rubbing medications like Icy Hot or Asper cream with Lidocaine may help without significant side effects.  Acetaminophen and/or Aleve/Ibuprofen can be taken if needed and if not contraindications. Local ice and heat may be alternated to reduce pain and spasms. Also massage and even chiropractor treatment.      Muscle relaxants might or might not help, they cause drowsiness among other    side effects. They could also interact with some of medications you may be already taking (medications for depression/anxiety and some pain medications).   8. Maintain a healthy weight. Excess weight puts extra stress on your back and makes it difficult to maintain good posture.   SEEK MEDICAL CARE IF: worsening pain, associated fever, rash/edema on area, pain going to legs or buttocks, numbness/tingling, night pain, or abnormal weight loss.    SEEK IMMEDIATE MEDICAL CARE IF:  1. You develop new bowel or bladder control problems. 2. You have unusual weakness or numbness in your arms or legs. 3. You develop nausea or vomiting. 4. You develop abdominal pain. 5. You feel faint.     Back Exercises The following  exercises strengthen the muscles that help to support the back. They also help to keep the lower back flexible. Doing these exercises can help to prevent back pain or lessen existing pain. If you have back pain or discomfort, try doing these exercises 2-3 times each day or as told by your health care provider. When the pain goes away, do them once each day, but increase the number of times that you repeat the steps for each exercise (do more repetitions). If you do not have back pain or discomfort, do these exercises once each day or as told by your health care provider.   EXERCISES Single Knee to Chest Repeat these steps 3-5 times for each leg: 5. Lie on your back on a firm bed or the floor with your legs  extended. 6. Bring one knee to your chest. Your other leg should stay extended and in contact with the floor. 7. Hold your knee in place by grabbing your knee or thigh. 8. Pull on your knee until you feel a gentle stretch in your lower back. 9. Hold the stretch for 10-30 seconds. 10. Slowly release and straighten your leg.  Pelvic Tilt Repeat these steps 5-10 times: 2. Lie on your back on a firm bed or the floor with your legs extended. 3. Bend your knees so they are pointing toward the ceiling and your feet are flat on the floor. 4. Tighten your lower abdominal muscles to press your lower back against the floor. This motion will tilt your pelvis so your tailbone points up toward the ceiling instead of pointing to your feet or the floor. 5. With gentle tension and even breathing, hold this position for 5-10 seconds.  Cat-Cow Repeat these steps until your lower back becomes more flexible: 1. Get into a hands-and-knees position on a firm surface. Keep your hands under your shoulders, and keep your knees under your hips. You may place padding under your knees for comfort. 2. Let your head hang down, and point your tailbone toward the floor so your lower back becomes rounded like the back of a cat. 3. Hold this position for 5 seconds. 4. Slowly lift your head and point your tailbone up toward the ceiling so your back forms a sagging arch like the back of a cow. 5. Hold this position for 5 seconds.   Press-Ups Repeat these steps 5-10 times: 6. Lie on your abdomen (face-down) on the floor. 7. Place your palms near your head, about shoulder-width apart. 8. While you keep your back as relaxed as possible and keep your hips on the floor, slowly straighten your arms to raise the top half of your body and lift your shoulders. Do not use your back muscles to raise your upper torso. You may adjust the placement of your hands to make yourself more comfortable. 9. Hold this position for 5 seconds while  you keep your back relaxed. 10. Slowly return to lying flat on the floor.   Bridges Repeat these steps 10 times: 1. Lie on your back on a firm surface. 2. Bend your knees so they are pointing toward the ceiling and your feet are flat on the floor. 3. Tighten your buttocks muscles and lift your buttocks off of the floor until your waist is at almost the same height as your knees. You should feel the muscles working in your buttocks and the back of your thighs. If you do not feel these muscles, slide your feet 1-2 inches farther away from your buttocks. 4.  Hold this position for 3-5 seconds. 5. Slowly lower your hips to the starting position, and allow your buttocks muscles to relax completely. If this exercise is too easy, try doing it with your arms crossed over your chest.    Please be sure medication list is accurate. If a new problem present, please set up appointment sooner than planned today.

## 2018-07-26 NOTE — Progress Notes (Signed)
ACUTE VISIT   HPI:  Chief Complaint  Patient presents with  . Back Pain    mid lower back pain that started yesterday, sharp pain after bending over to tie shoes    Judith Moore is a 32 y.o. female, who is here today complaining of lower back pain.  She is reporting this problem as new. Sudden onset after she did bend over yesterday around 8 am   She denies any recent injury or unusual level of activity. States that a few days before pain started she was dancing and she pull groin muscle, right side. She had tenderness for a couple days but pain resolved.  Pain is constant,no radiated, pulling/tightness like, 6/10 now but it was 10/10 yesterday. Initially she had burning sensation, like her back was "in fire." No associated LE numbness but has had "little" tingling RLE. No urinary incontinence or retention, stool incontinence, or saddle anesthesia.  Exacerbated by movement. Alleviated by rest. No rash or edema on area, fever, chills, or abnormal wt loss.  Today she noted mild pain on pubic area.  No urinary symptoms.  Prior Hx of back pain: Denies.   OTC medications: Ibuprofen,none today. She has also apply local heat.  Review of Systems  Constitutional: Negative for activity change, appetite change, fatigue and fever.  Respiratory: Negative for cough, shortness of breath and wheezing.   Cardiovascular: Negative for leg swelling.  Gastrointestinal: Negative for abdominal pain, nausea (Yesterday) and vomiting.       Negative for changes in bowel habits.  Genitourinary: Negative for decreased urine volume, dysuria, hematuria, vaginal bleeding and vaginal discharge.  Musculoskeletal: Positive for back pain and gait problem. Negative for arthralgias and neck pain.  Skin: Negative for rash.  Neurological: Negative for weakness and numbness.  Hematological: Negative for adenopathy.  Psychiatric/Behavioral: The patient is nervous/anxious.       Current  Outpatient Medications on File Prior to Visit  Medication Sig Dispense Refill  . cetirizine (ZYRTEC ALLERGY) 10 MG tablet Take 1 tablet (10 mg total) by mouth daily. (Patient taking differently: Take 10 mg by mouth as needed. ) 30 tablet 11  . fluticasone (FLONASE) 50 MCG/ACT nasal spray Place 1 spray into both nostrils daily. 16 g 4  . ibuprofen (ADVIL,MOTRIN) 800 MG tablet Take 1 tablet (800 mg total) by mouth every 8 (eight) hours as needed. 30 tablet 5  . JUNEL FE 24 1-20 MG-MCG(24) tablet TK 1 T PO D  11   No current facility-administered medications on file prior to visit.      Past Medical History:  Diagnosis Date  . Hyperthyroidism    Allergies  Allergen Reactions  . Almond (Diagnostic) Hives and Swelling    Almonds    Social History   Socioeconomic History  . Marital status: Divorced    Spouse name: SEPARATED  . Number of children: 2  . Years of education: 15+  . Highest education level: Not on file  Occupational History  . 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 Financial resource strain: Not on file  . Food insecurity:    Worry: Not on file    Inability: Not on file  . Transportation needs:    Medical: Not on file    Non-medical: Not on file  Tobacco Use  . Smoking status: Never Smoker  . Smokeless tobacco: Never Used  Substance and Sexual Activity  . Alcohol use: Not Currently  .  Drug use: No  . Sexual activity: Yes    Partners: Male    Birth control/protection: IUD    Comment: Mirena  Lifestyle  . Physical activity:    Days per week: Not on file    Minutes per session: Not on file  . Stress: Not on file  Relationships  . Social connections:    Talks on phone: Not on file    Gets together: Not on file    Attends religious service: Not on file    Active member of club or organization: Not on file    Attends meetings of clubs or organizations: Not on file    Relationship status: Not on file  Other  Topics Concern  . Not on file  Social History Narrative   Lives with her two sons, her uncle and cousin. Separated from husband 11/2010.    Vitals:   07/26/18 1040  BP: 120/78  Pulse: 72  Resp: 12  Temp: 98.8 F (37.1 C)  SpO2: 98%   Body mass index is 29.07 kg/m.    Physical Exam  Nursing note and vitals reviewed. Constitutional: She is oriented to person, place, and time. She appears well-developed. She does not appear ill. No distress.  HENT:  Head: Normocephalic and atraumatic.  Eyes: Pupils are equal, round, and reactive to light. Conjunctivae and EOM are normal.  Respiratory: Effort normal and breath sounds normal. No respiratory distress.  GI: Soft. She exhibits no mass. There is no hepatomegaly. There is no tenderness.  Musculoskeletal: She exhibits no edema.       Lumbar back: She exhibits tenderness. She exhibits no swelling and no deformity.       Back:  No significant deformity appreciated. Tenderness upon palpation of lumbar paraspinal muscles. Pain elicited with movement on exam table during examination. No local edema or erythema appreciated, no suspicious lesions.  Lymphadenopathy:    She has no cervical adenopathy.  Neurological: She is alert and oriented to person, place, and time. She has normal strength. Coordination normal.  Reflex Scores:      Patellar reflexes are 2+ on the right side and 2+ on the left side.      Achilles reflexes are 2+ on the right side and 2+ on the left side. SLR negative bilateral.  Skin: Skin is warm. No rash noted. No erythema.  Psychiatric: Her mood appears anxious.  Well groomed, good eye contact.      ASSESSMENT AND PLAN:  Judith Moore was seen today for back pain.  Diagnoses and all orders for this visit:  Acute bilateral low back pain, unspecified whether sciatica present  No Hx of trauma,so I do not think imaging is needed today. Here in the office and after verbal consent she received Toradol IM. She will  continue Celebrex 100 mg bid for up to 10 days,side effects discussed. Relative rest. Instructed about warning signs and f/u as needed.   -     celecoxib (CELEBREX) 100 MG capsule; Take 1 capsule (100 mg total) by mouth 2 (two) times daily for 10 days. -     methocarbamol (ROBAXIN) 500 MG tablet; Take 1 tablet (500 mg total) by mouth every 8 (eight) hours as needed for up to 15 days for muscle spasms. -     ketorolac (TORADOL) injection 60 mg  Radicular pain  Burning sensation,RLE tingling,and pubic area pain. Side effects of Prednisone discussed,she agrees with trying. Clearly instructed about warning signs.  -     predniSONE (DELTASONE) 20 MG  tablet; 3 tabs for 3 days, 2 tabs for 3 days, 1 tabs for 3 days, and 1/2 tab for 3 days. Take tables together with breakfast. -     ketorolac (TORADOL) injection 60 mg     Return if symptoms worsen or fail to improve.      Betty G. [place], MD  [facility]. [place] office.

## 2018-07-28 ENCOUNTER — Encounter: Payer: Self-pay | Admitting: Family Medicine

## 2018-08-03 ENCOUNTER — Telehealth: Payer: Self-pay | Admitting: Family Medicine

## 2018-08-03 DIAGNOSIS — Z0279 Encounter for issue of other medical certificate: Secondary | ICD-10-CM

## 2018-08-03 NOTE — Telephone Encounter (Signed)
Form placed on doctor's desk for completion. 

## 2018-08-03 NOTE — Telephone Encounter (Signed)
FMLA paperwork to be filled out, placed in dr's folder.  Upon completion, fax to : [phone], Attn: [facility]

## 2018-08-08 NOTE — Telephone Encounter (Signed)
Form faxed with confirmation. Patient informed that a copy is at the front desk ready for pick up.

## 2018-12-07 ENCOUNTER — Encounter: Payer: Self-pay | Admitting: Obstetrics

## 2018-12-07 ENCOUNTER — Ambulatory Visit: Payer: BLUE CROSS/BLUE SHIELD | Admitting: Obstetrics

## 2018-12-07 VITALS — BP 140/84 | HR 75 | Wt 182.8 lb

## 2018-12-07 DIAGNOSIS — N898 Other specified noninflammatory disorders of vagina: Secondary | ICD-10-CM

## 2018-12-07 DIAGNOSIS — N946 Dysmenorrhea, unspecified: Secondary | ICD-10-CM | POA: Diagnosis not present

## 2018-12-07 DIAGNOSIS — Z3009 Encounter for other general counseling and advice on contraception: Secondary | ICD-10-CM | POA: Diagnosis not present

## 2018-12-07 DIAGNOSIS — Z113 Encounter for screening for infections with a predominantly sexual mode of transmission: Secondary | ICD-10-CM | POA: Diagnosis not present

## 2018-12-07 MED ORDER — IBUPROFEN 800 MG PO TABS: 800.0000 mg | ORAL_TABLET | Freq: Three times a day (TID) | ORAL | 5 refills | Status: DC | PRN

## 2018-12-07 NOTE — Progress Notes (Signed)
Patient is in the office for vaginal itching, wants std testing.

## 2018-12-07 NOTE — Progress Notes (Signed)
Patient ID: Judith Moore, female   DOB: Apr 12, 1986, 33 y.o.   MRN: [id number]  Chief Complaint  Patient presents with  . GYN    HPI Judith Moore is a 33 y.o. female.  Vaginal itching. HPI  Past Medical History:  Diagnosis Date  . Hyperthyroidism     Past Surgical History:  Procedure Laterality Date  . ADENOIDECTOMY    . CESAREAN SECTION  09/28/2005, 11/28/2007  . LAPAROSCOPIC APPENDECTOMY N/A 01/06/2016   Procedure: APPENDECTOMY LAPAROSCOPIC;  Surgeon: Luretha Murphy, MD;  Location: [facility];  Service: General;  Laterality: N/A;  . stomach banding    . TONSILLECTOMY      Family History  Problem Relation Age of Onset  . Hypertension Mother   . Thyroid disease Mother        multinodular goiter  . Diabetes Maternal Uncle   . Healthy Brother        x1  . Hypertension Maternal Grandmother   . Hypertension Maternal Grandfather   . Cancer Neg Hx   . Heart disease Neg Hx     Social History Social History   Tobacco Use  . Smoking status: Never Smoker  . Smokeless tobacco: Never Used  Substance Use Topics  . Alcohol use: Not Currently  . Drug use: No    Allergies  Allergen Reactions  . Almond (Diagnostic) Hives and Swelling    Almonds    Current Outpatient Medications  Medication Sig Dispense Refill  . JUNEL FE 24 1-20 MG-MCG(24) tablet TK 1 T PO D  11  . cetirizine (ZYRTEC ALLERGY) 10 MG tablet Take 1 tablet (10 mg total) by mouth daily. (Patient not taking: Reported on 12/07/2018) 30 tablet 11  . fluticasone (FLONASE) 50 MCG/ACT nasal spray Place 1 spray into both nostrils daily. (Patient not taking: Reported on 12/07/2018) 16 g 4  . ibuprofen (ADVIL,MOTRIN) 800 MG tablet Take 1 tablet (800 mg total) by mouth every 8 (eight) hours as needed. 30 tablet 5   No current facility-administered medications for this visit.     Review of Systems Review of Systems Constitutional: negative for fatigue and weight loss Respiratory: negative for cough and  wheezing Cardiovascular: negative for chest pain, fatigue and palpitations Gastrointestinal: negative for abdominal pain and change in bowel habits Genitourinary:positive for vaginal itching  Integument/breast: negative for nipple discharge Musculoskeletal:negative for myalgias Neurological: negative for gait problems and tremors Behavioral/Psych: negative for abusive relationship, depression Endocrine: negative for temperature intolerance      Blood pressure 140/84, pulse 75, weight 182 lb 12.8 oz (82.9 kg), last menstrual period 11/30/2018.  Physical Exam Physical Exam           General:  Alert and no distress Abdomen:  normal findings: no organomegaly, soft, non-tender and no hernia  Pelvis:  External genitalia: normal general appearance Urinary system: urethral meatus normal and bladder without fullness, nontender Vaginal: normal without tenderness, induration or masses Cervix: normal appearance Adnexa: normal bimanual exam Uterus: anteverted and non-tender, normal size    50% of 20 min visit spent on counseling and coordination of care.   Data Reviewed: Wet prep and Culture  Assessment        1. Vaginal itching Rx: - Cervicovaginal ancillary only( [place])  2. Dysmenorrhea Rx: - ibuprofen (ADVIL,MOTRIN) 800 MG tablet; Take 1 tablet (800 mg total) by mouth every 8 (eight) hours as needed.  Dispense: 30 tablet; Refill: 5  3. Encounter for other general counseling and advice on contraception - wants to  go back to Mirena IUD.  Had undesired side effects with the pill   Plan    Follow up with menses for Mirena IUD Insertion  No orders of the defined types were placed in this encounter.  Meds ordered this encounter  Medications  . ibuprofen (ADVIL,MOTRIN) 800 MG tablet    Sig: Take 1 tablet (800 mg total) by mouth every 8 (eight) hours as needed.    Dispense:  30 tablet    Refill:  5    Brock Bad MD 12-07-2018

## 2018-12-09 LAB — CERVICOVAGINAL ANCILLARY ONLY
BACTERIAL VAGINITIS: NEGATIVE
CHLAMYDIA, DNA PROBE: NEGATIVE
Candida vaginitis: NEGATIVE
Neisseria Gonorrhea: NEGATIVE
TRICH (WINDOWPATH): NEGATIVE

## 2018-12-30 ENCOUNTER — Encounter: Payer: Self-pay | Admitting: Family Medicine

## 2018-12-30 ENCOUNTER — Other Ambulatory Visit: Payer: Self-pay

## 2018-12-30 ENCOUNTER — Ambulatory Visit: Payer: BLUE CROSS/BLUE SHIELD | Admitting: Family Medicine

## 2018-12-30 VITALS — BP 118/78 | HR 72 | Temp 98.4°F | Resp 12 | Ht 66.0 in | Wt 178.4 lb

## 2018-12-30 DIAGNOSIS — F419 Anxiety disorder, unspecified: Secondary | ICD-10-CM | POA: Diagnosis not present

## 2018-12-30 MED ORDER — FLUOXETINE HCL 20 MG PO TABS: 20.0000 mg | ORAL_TABLET | Freq: Every day | ORAL | 3 refills | Status: DC

## 2018-12-30 NOTE — Patient Instructions (Signed)
A few things to remember from today's visit:   Anxiety disorder, unspecified type - Plan: FLUoxetine (PROZAC) 20 MG tablet  You can start with half tablet for a few days and increase to 20 mg if well tolerated. I would like to follow in around 6 weeks.  Please be sure medication list is accurate. If a new problem present, please set up appointment sooner than planned today.

## 2018-12-30 NOTE — Assessment & Plan Note (Addendum)
Problem getting worse. We discussed pharmacologic options treatment.  Because she has tolerated Prozac in the past and it seemed to help, she agrees with resuming medication. We discussed some side effects. Recommend continuing counseling.  Instructed about warning signs. We will follow in 6 weeks, ideally through tele-med.

## 2018-12-30 NOTE — Progress Notes (Addendum)
HPI:   JudithJudith Moore is a 33 y.o. female, who is here today complaining of worsening anxiety.  She was last seen for this problem on 02/18/2018.  She has been on Prozac 20 mg in the past (05/2017), which has helped.  She discontinued medication because she thought she would be able to manage symptoms with psychotherapy. Symptoms progressively getting worse since 07/2018. She is also concerned about current situation with coronavirus pandemic.  Symptoms exacerbated when she remembers her brother, who died a couple years ago. She denies depressed mood or suicidal thoughts.  She tolerated Prozac tablets better than capsule, S/P gastric sleeve, since then she has had more GI symptoms with oral medications.   Depression screen [facility] 2/9 12/30/2018 07/09/2017 04/20/2013  Decreased Interest 0 0 0  Down, Depressed, Hopeless 1 0 0  PHQ - 2 Score 1 0 0  Altered sleeping 1 - -  Tired, decreased energy 2 - -  Change in appetite 0 - -  Feeling bad or failure about yourself  0 - -  Trouble concentrating 0 - -  Moving slowly or fidgety/restless 0 - -  Suicidal thoughts 0 - -  PHQ-9 Score 4 - -  Difficult doing work/chores Somewhat difficult - -   GAD 7 : Generalized Anxiety Score 12/30/2018  Nervous, Anxious, on Edge 3  Control/stop worrying 2  Worry too much - different things 2  Trouble relaxing 2  Restless 1  Easily annoyed or irritable 0  Afraid - awful might happen 3  Total GAD 7 Score 13  Anxiety Difficulty Somewhat difficult    Review of Systems  Constitutional: Positive for appetite change. Negative for fatigue.  Respiratory: Negative for chest tightness and shortness of breath.   Cardiovascular: Negative for chest pain and palpitations.  Psychiatric/Behavioral: Positive for sleep disturbance. Negative for confusion, hallucinations and suicidal ideas. The patient is nervous/anxious.    Current Outpatient Medications on File Prior to Visit  Medication Sig Dispense  Refill  . cetirizine (ZYRTEC ALLERGY) 10 MG tablet Take 1 tablet (10 mg total) by mouth daily. 30 tablet 11  . fluticasone (FLONASE) 50 MCG/ACT nasal spray Place 1 spray into both nostrils daily. 16 g 4  . ibuprofen (ADVIL,MOTRIN) 800 MG tablet Take 1 tablet (800 mg total) by mouth every 8 (eight) hours as needed. 30 tablet 5  . JUNEL FE 24 1-20 MG-MCG(24) tablet TK 1 T PO D  11   No current facility-administered medications on file prior to visit.      Past Medical History:  Diagnosis Date  . Hyperthyroidism    Allergies  Allergen Reactions  . Almond (Diagnostic) Hives and Swelling    Almonds    Social History   Socioeconomic History  . Marital status: Divorced    Spouse name: SEPARATED  . Number of children: 2  . Years of education: 15+  . Highest education level: Not on file  Occupational History  . 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 Financial resource strain: Not on file  . Food insecurity:    Worry: Not on file    Inability: Not on file  . Transportation needs:    Medical: Not on file    Non-medical: Not on file  Tobacco Use  . Smoking status: Never Smoker  . Smokeless tobacco: Never Used  Substance and Sexual Activity  . Alcohol use: Not Currently  . Drug use: No  .  Sexual activity: Yes    Partners: Male    Birth control/protection: I.U.D.    Comment: Mirena  Lifestyle  . Physical activity:    Days per week: Not on file    Minutes per session: Not on file  . Stress: Not on file  Relationships  . Social connections:    Talks on phone: Not on file    Gets together: Not on file    Attends religious service: Not on file    Active member of club or organization: Not on file    Attends meetings of clubs or organizations: Not on file    Relationship status: Not on file  Other Topics Concern  . Not on file  Social History Narrative   Lives with her two sons, her uncle and cousin. Separated from husband  11/2010.    Vitals:   12/30/18 0931  BP: 118/78  Pulse: 72  Resp: 12  Temp: 98.4 F (36.9 C)  SpO2: 100%   Body mass index is 28.79 kg/m.  Physical Exam  Constitutional: She is oriented to person, place, and time. She appears well-developed. No distress.  HENT:  Head: Normocephalic and atraumatic.  Mouth/Throat: Oropharynx is clear and moist and mucous membranes are normal.  Eyes: Conjunctivae are normal.  Cardiovascular: Normal rate and regular rhythm.  No murmur heard. Respiratory: Effort normal and breath sounds normal. No respiratory distress.  Musculoskeletal:        General: No edema.  Neurological: She is alert and oriented to person, place, and time. She has normal strength. Gait normal.  Skin: Skin is warm. No erythema.  Psychiatric: Her mood appears anxious. She expresses no suicidal ideation. She expresses no suicidal plans.  Well-groomed, good eye contact.   ASSESSMENT AND PLAN:   Judith Moore was seen today for anxiety follow-up.  No orders of the defined types were placed in this encounter.  Anxiety disorder Problem getting worse. We discussed pharmacologic options treatment.  Because she has tolerated Prozac in the past and it seemed to help, she agrees with resuming medication. We discussed some side effects. Recommend continuing counseling.  Instructed about warning signs. We will follow in 6 weeks, ideally through tele-med.    Return in about 6 weeks (around 02/10/2019) for anxiety.    Betty G. [place], MD  [facility]. [place] office.

## 2019-03-22 ENCOUNTER — Other Ambulatory Visit: Payer: Self-pay | Admitting: Family Medicine

## 2019-03-22 DIAGNOSIS — F419 Anxiety disorder, unspecified: Secondary | ICD-10-CM

## 2019-03-24 ENCOUNTER — Other Ambulatory Visit: Payer: Self-pay | Admitting: Obstetrics

## 2019-06-01 IMAGING — US US PELVIS COMPLETE TRANSABD/TRANSVAG
1 series · 14 of 25 positions shown · non-contrast
Comparison: CT abdomen and pelvis 01/06/2016

CLINICAL DATA: Encounter for IUD removal and replacement

EXAM:
TRANSABDOMINAL AND TRANSVAGINAL ULTRASOUND OF PELVIS
TECHNIQUE: Both transabdominal and transvaginal ultrasound examinations of the
pelvis were performed. Transabdominal technique was performed for
global imaging of the pelvis including uterus, ovaries, adnexal
regions, and pelvic cul-de-sac. It was necessary to proceed with
endovaginal exam following the transabdominal exam to visualize the
endometrium and IUD.

[Series 1: us pelvis complete transabd/transvag · 0.23mm/px · 56 acquisitions, 14 frames shown]
[im 1/56]
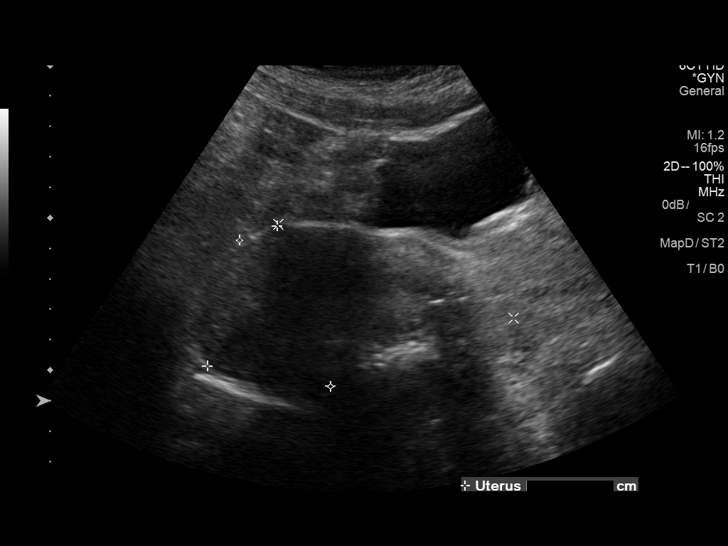
[im 5/56]
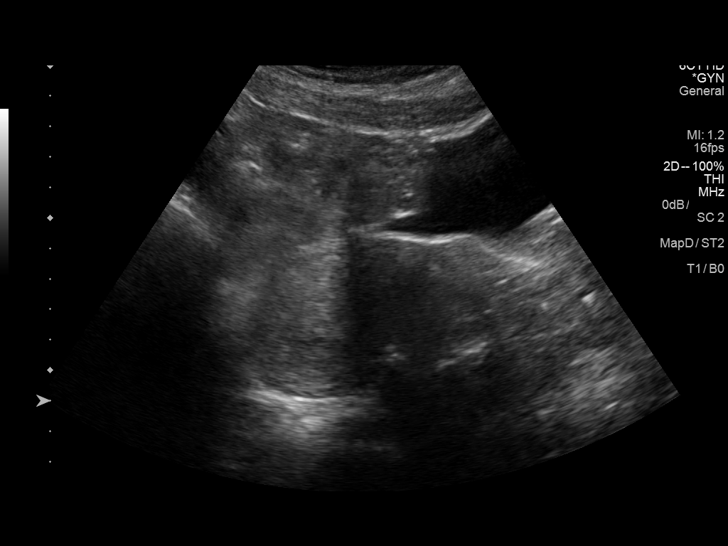
[im 10/56]
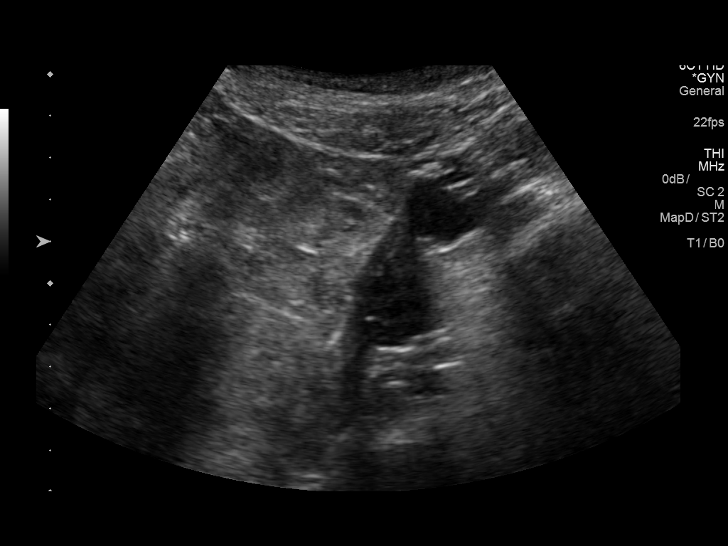
[im 14/56]
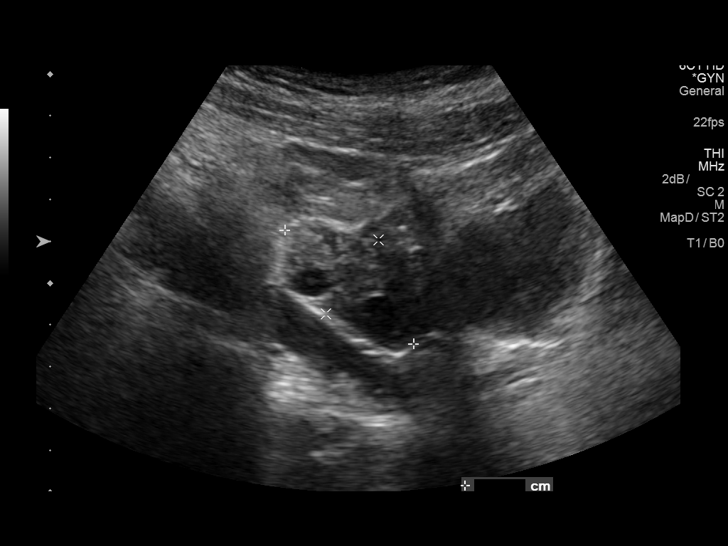
[im 19/56]
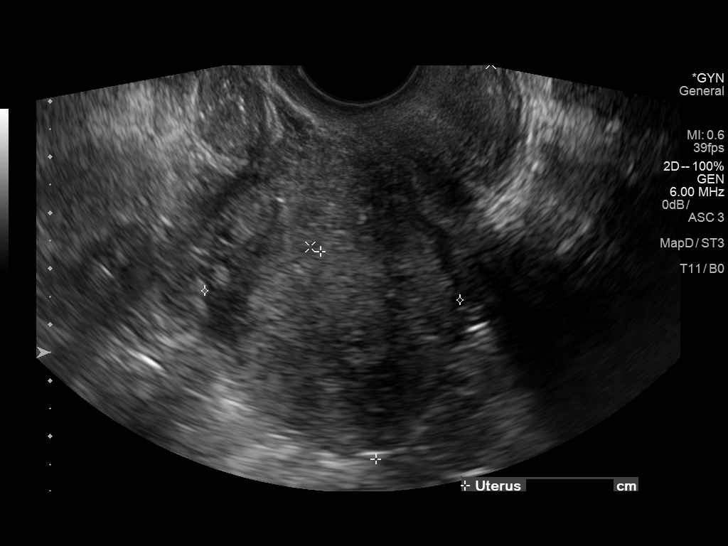
[im 21/56]
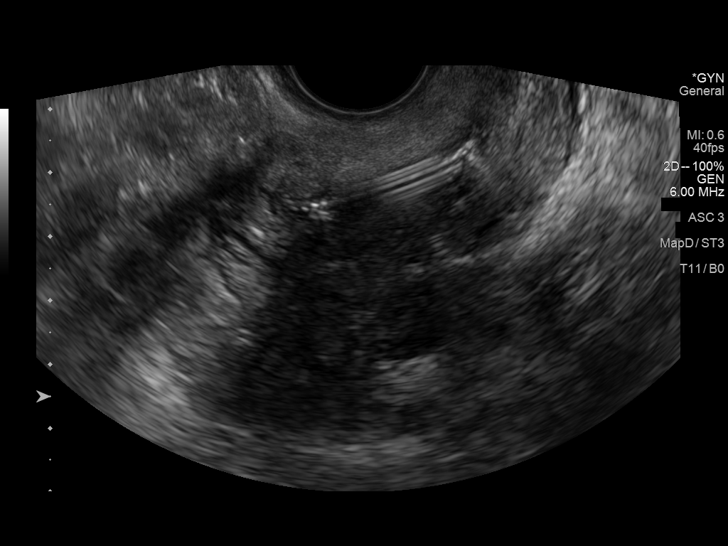
[im 26/56]
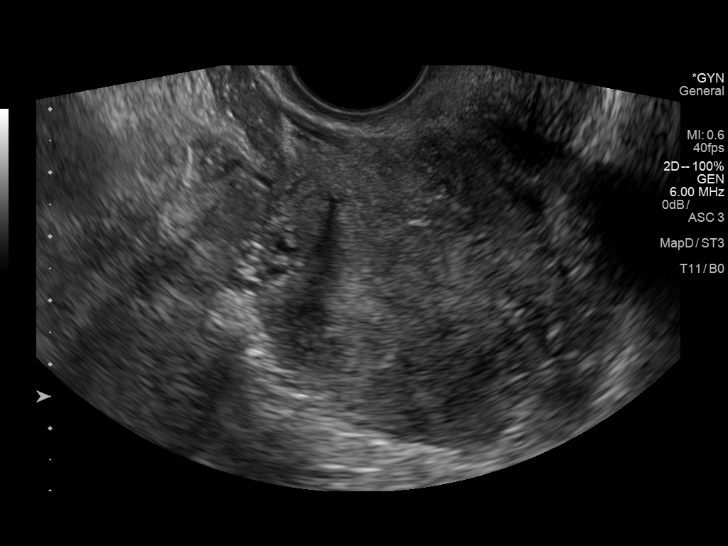
[im 30/56]
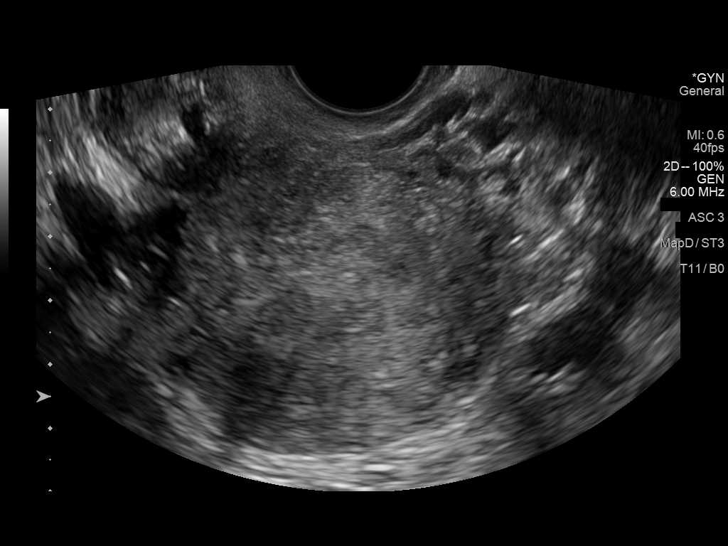
[im 35/56]
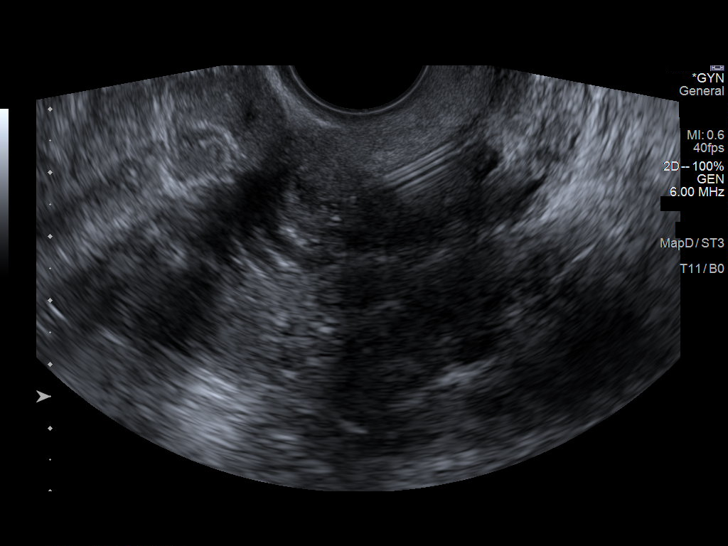
[im 37/56]
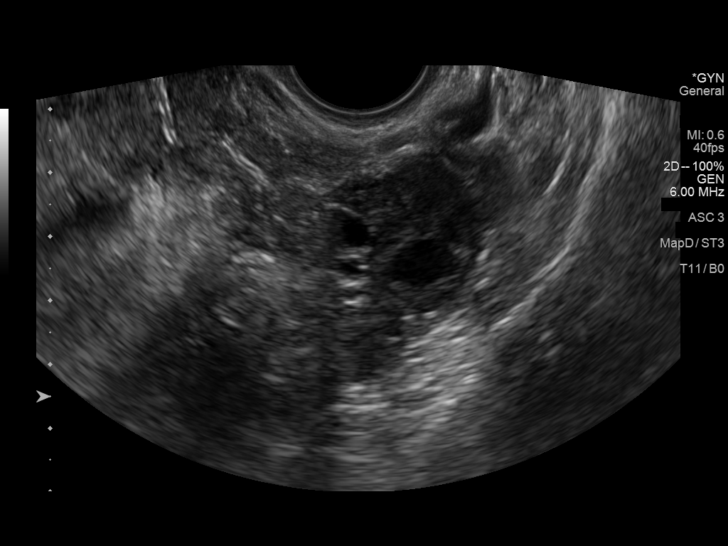
[im 42/56]
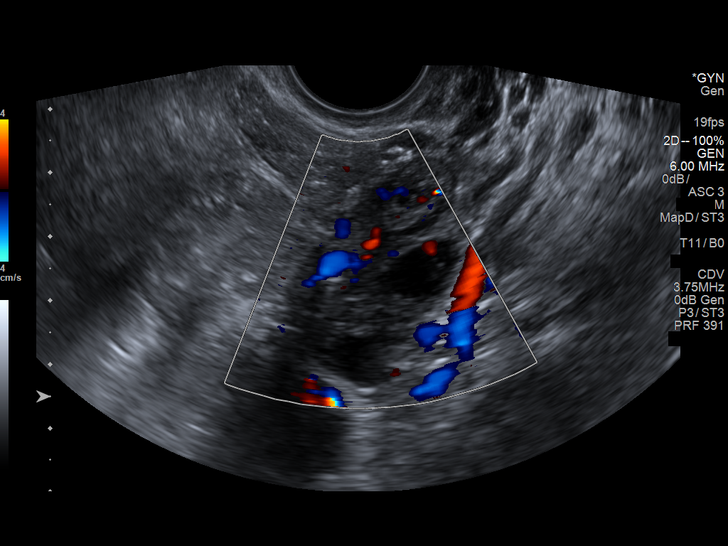
[im 46/56]
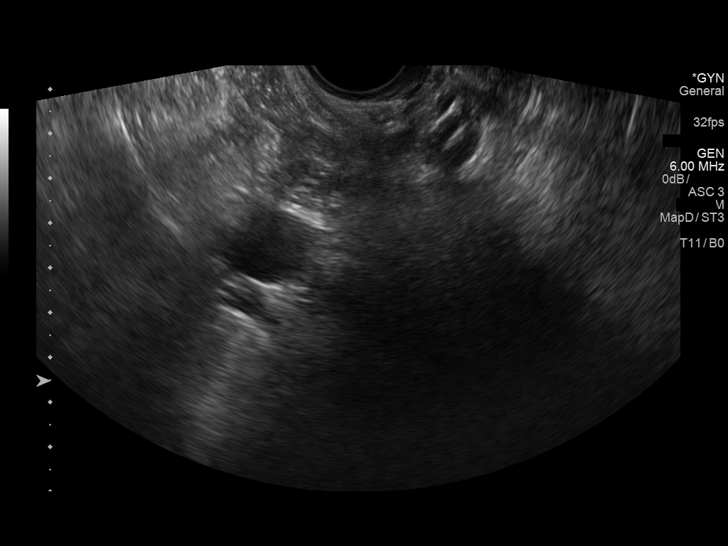
[im 51/56]
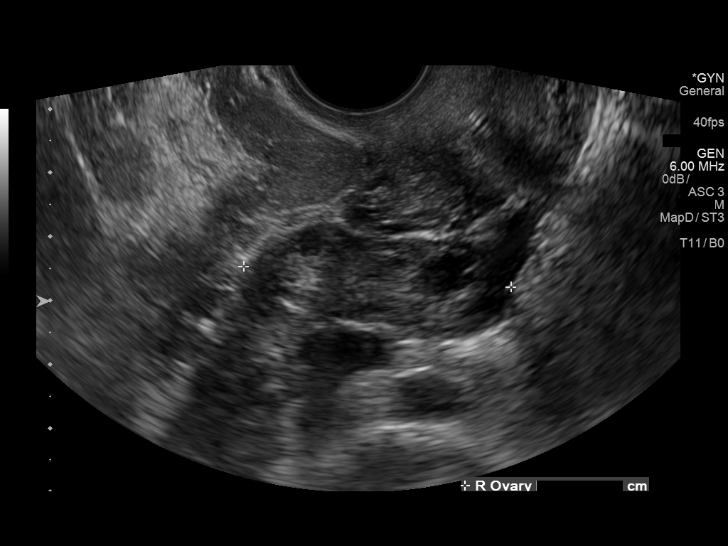
[im 56/56]
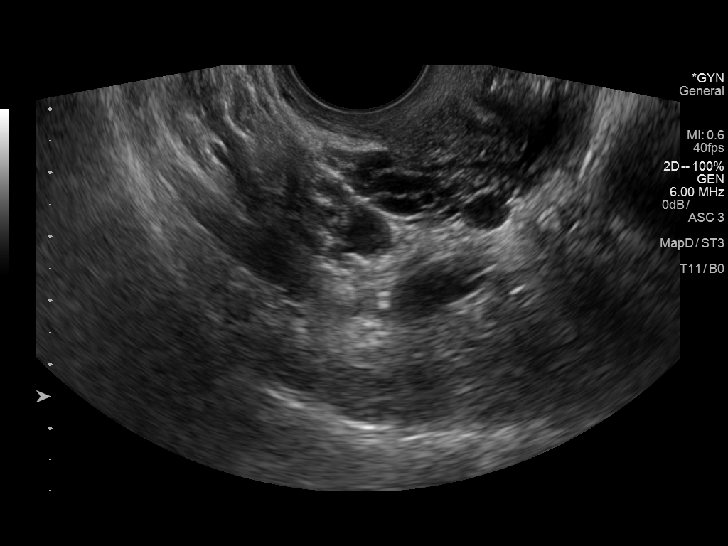

[14 of 25 positions shown; findings below may reference images not displayed]

FINDINGS: Uterus

Measurements: 8.4 x 5.0 x 5.5 cm. Retroflexed. Heterogeneous
echogenicity without mass

Endometrium

Thickness: 2 mm thick. Abnormal position of the IUD, located at the
lower uterine segment. Arms of the IUD are not well localized.

Right ovary

Measurements: 4.2 x 1.3 x 3.4 cm.  Normal morphology without mass

Left ovary

Measurements: 3.6 x 2.6 x 1.9 cm.  Normal morphology without mass

Other findings

No free pelvic fluid or adnexal masses.
IMPRESSION: Unremarkable ovaries.

Abnormal position of the IUD, located at the lower uterine segment.

## 2019-07-05 ENCOUNTER — Ambulatory Visit (INDEPENDENT_AMBULATORY_CARE_PROVIDER_SITE_OTHER): Payer: BC Managed Care – PPO | Admitting: Family Medicine

## 2019-07-05 ENCOUNTER — Encounter: Payer: Self-pay | Admitting: Family Medicine

## 2019-07-05 ENCOUNTER — Other Ambulatory Visit: Payer: Self-pay

## 2019-07-05 VITALS — BP 120/74 | HR 72 | Temp 98.0°F | Resp 12 | Ht 66.0 in | Wt 183.5 lb

## 2019-07-05 DIAGNOSIS — E559 Vitamin D deficiency, unspecified: Secondary | ICD-10-CM | POA: Diagnosis not present

## 2019-07-05 DIAGNOSIS — Z130 Encounter for screening for diseases of the blood and blood-forming organs and certain disorders involving the immune mechanism: Secondary | ICD-10-CM

## 2019-07-05 DIAGNOSIS — Z9884 Bariatric surgery status: Secondary | ICD-10-CM

## 2019-07-05 DIAGNOSIS — Z13228 Encounter for screening for other metabolic disorders: Secondary | ICD-10-CM

## 2019-07-05 DIAGNOSIS — Z13220 Encounter for screening for lipoid disorders: Secondary | ICD-10-CM

## 2019-07-05 DIAGNOSIS — Z1329 Encounter for screening for other suspected endocrine disorder: Secondary | ICD-10-CM | POA: Diagnosis not present

## 2019-07-05 DIAGNOSIS — F419 Anxiety disorder, unspecified: Secondary | ICD-10-CM | POA: Diagnosis not present

## 2019-07-05 DIAGNOSIS — Z0000 Encounter for general adult medical examination without abnormal findings: Secondary | ICD-10-CM

## 2019-07-05 DIAGNOSIS — E039 Hypothyroidism, unspecified: Secondary | ICD-10-CM

## 2019-07-05 DIAGNOSIS — E0590 Thyrotoxicosis, unspecified without thyrotoxic crisis or storm: Secondary | ICD-10-CM

## 2019-07-05 LAB — CBC
HCT: 36.3 % (ref 36.0–46.0)
Hemoglobin: 11.9 g/dL — ABNORMAL LOW (ref 12.0–15.0)
MCHC: 32.7 g/dL (ref 30.0–36.0)
MCV: 84.3 fl (ref 78.0–100.0)
Platelets: 278 K/uL (ref 150.0–400.0)
RBC: 4.3 Mil/uL (ref 3.87–5.11)
RDW: 13.3 % (ref 11.5–15.5)
WBC: 4.3 K/uL (ref 4.0–10.5)

## 2019-07-05 LAB — LIPID PANEL
Cholesterol: 165 mg/dL (ref 0–200)
HDL: 58.2 mg/dL
LDL Cholesterol: 97 mg/dL (ref 0–99)
NonHDL: 107.28
Total CHOL/HDL Ratio: 3
Triglycerides: 52 mg/dL (ref 0.0–149.0)
VLDL: 10.4 mg/dL (ref 0.0–40.0)

## 2019-07-05 LAB — BASIC METABOLIC PANEL WITH GFR
BUN: 10 mg/dL (ref 6–23)
CO2: 27 meq/L (ref 19–32)
Calcium: 8.9 mg/dL (ref 8.4–10.5)
Chloride: 103 meq/L (ref 96–112)
Creatinine, Ser: 0.91 mg/dL (ref 0.40–1.20)
GFR: 85.63 mL/min
Glucose, Bld: 72 mg/dL (ref 70–99)
Potassium: 4 meq/L (ref 3.5–5.1)
Sodium: 138 meq/L (ref 135–145)

## 2019-07-05 LAB — VITAMIN B12: Vitamin B-12: 275 pg/mL (ref 211–911)

## 2019-07-05 LAB — TSH: TSH: 0.55 u[IU]/mL (ref 0.35–4.50)

## 2019-07-05 LAB — VITAMIN D 25 HYDROXY (VIT D DEFICIENCY, FRACTURES): VITD: 12.44 ng/mL — ABNORMAL LOW (ref 30.00–100.00)

## 2019-07-05 LAB — HEMOGLOBIN A1C: Hgb A1c MFr Bld: 5.8 % (ref 4.6–6.5)

## 2019-07-05 MED ORDER — FLUOXETINE HCL 20 MG PO TABS: 20.0000 mg | ORAL_TABLET | Freq: Every day | ORAL | 1 refills | Status: DC

## 2019-07-05 NOTE — Progress Notes (Signed)
HPI:   Judith Moore is a 33 y.o. female, who is here today for her routine physical.  Last CPE: 09/2016.  Regular exercise 3 or more time per week: She is walking a few times around neighborhood. Following a healthy diet: She is doing better but did not do so for a few months when COVID 19 pandemia started. She lives with her 2 sons (86-14 yo).  Pap smear: 04/29/18. Follows with gyn regularly,Dr Clearance Coots. She is on OCP's, she is planning on going back to [place]  Chronic medical problems: Anxiety,allergic rhinitis,and vit D deficiency.  About 4 years ago she underwent bariatric surgery. She has not followed with surgeon.  Hyperthyroidism/Graves' disease, she has not followed with her endocrinologist, Dr. Everardo All. She has not noted weight loss, palpitation, tremor diarrhea, or cold/heat intolerance.   Lab Results  Component Value Date   TSH 0.57 10/15/2017    There is no immunization history on file for this patient.  Follow up and concerns today:  B12 and B1 deficiency: She is taking gummies multi vit but not daily, she does not think it helps. Vit in tab causes nausea sometimes.   Lab Results  Component Value Date   VITAMINB12 320 05/21/2017   Lab Results  Component Value Date   WBC 4.7 05/21/2017   HGB 12.0 05/21/2017   HCT 38.2 05/21/2017   MCV 85.8 05/21/2017   PLT 272.0 05/21/2017    Review of Systems  Constitutional: Positive for fatigue. Negative for appetite change and fever.  HENT: Negative for hearing loss, mouth sores, sore throat and trouble swallowing.   Eyes: Negative for redness and visual disturbance.  Respiratory: Negative for cough, shortness of breath and wheezing.   Cardiovascular: Negative for chest pain, palpitations and leg swelling.  Gastrointestinal: Negative for abdominal pain, nausea and vomiting.       No changes in bowel habits.  Endocrine: Negative for cold intolerance, heat intolerance, polydipsia, polyphagia and  polyuria.  Genitourinary: Negative for decreased urine volume, dysuria, hematuria, vaginal bleeding and vaginal discharge.  Musculoskeletal: Negative for gait problem and myalgias.  Skin: Negative for color change and rash.  Allergic/Immunologic: Positive for environmental allergies.  Neurological: Negative for syncope, weakness and headaches.  Psychiatric/Behavioral: Positive for sleep disturbance. Negative for confusion. The patient is nervous/anxious.   All other systems reviewed and are negative.   Current Outpatient Medications on File Prior to Visit  Medication Sig Dispense Refill  . BLISOVI 24 FE 1-20 MG-MCG(24) tablet TAKE 1 TABLET BY MOUTH EVERY DAY 84 tablet 2  . cetirizine (ZYRTEC ALLERGY) 10 MG tablet Take 1 tablet (10 mg total) by mouth daily. (Patient not taking: Reported on 07/05/2019) 30 tablet 11  . fluticasone (FLONASE) 50 MCG/ACT nasal spray Place 1 spray into both nostrils daily. (Patient not taking: Reported on 07/05/2019) 16 g 4   No current facility-administered medications on file prior to visit.      Past Medical History:  Diagnosis Date  . Hyperthyroidism     Past Surgical History:  Procedure Laterality Date  . ADENOIDECTOMY    . CESAREAN SECTION  09/28/2005, 11/28/2007  . LAPAROSCOPIC APPENDECTOMY N/A 01/06/2016   Procedure: APPENDECTOMY LAPAROSCOPIC;  Surgeon: Luretha Murphy, MD;  Location: [facility];  Service: General;  Laterality: N/A;  . stomach banding    . TONSILLECTOMY      Allergies  Allergen Reactions  . Almond (Diagnostic) Hives and Swelling    Almonds    Family History  Problem Relation  Age of Onset  . Hypertension Mother   . Thyroid disease Mother        multinodular goiter  . Diabetes Maternal Uncle   . Healthy Brother        x1  . Hypertension Maternal Grandmother   . Hypertension Maternal Grandfather   . Cancer Neg Hx   . Heart disease Neg Hx     Social History   Socioeconomic History  . Marital status: Divorced    Spouse  name: SEPARATED  . Number of children: 2  . Years of education: 15+  . Highest education level: Not on file  Occupational History  . Occupation: Medical illustrator: [employer]  . Financial resource strain: Not on file  . Food insecurity    Worry: Not on file    Inability: Not on file  . Transportation needs    Medical: Not on file    Non-medical: Not on file  Tobacco Use  . Smoking status: Never Smoker  . Smokeless tobacco: Never Used  Substance and Sexual Activity  . Alcohol use: Not Currently  . Drug use: No  . Sexual activity: Yes    Partners: Male    Birth control/protection: I.U.D.    Comment: Mirena  Lifestyle  . Physical activity    Days per week: Not on file    Minutes per session: Not on file  . Stress: Not on file  Relationships  . Social Herbalist on phone: Not on file    Gets together: Not on file    Attends religious service: Not on file    Active member of club or organization: Not on file    Attends meetings of clubs or organizations: Not on file    Relationship status: Not on file  Other Topics Concern  . Not on file  Social History Narrative   Lives with her two sons, her uncle and cousin. Separated from husband 11/2010.    Vitals:   07/05/19 0731  BP: 120/74  Pulse: 72  Resp: 12  Temp: 98 F (36.7 C)  SpO2: 100%   Body mass index is 29.62 kg/m.   Wt Readings from Last 3 Encounters:  07/05/19 183 lb 8 oz (83.2 kg)  12/30/18 178 lb 6 oz (80.9 kg)  12/07/18 182 lb 12.8 oz (82.9 kg)    Physical Exam  Nursing note and vitals reviewed. Constitutional: She is oriented to person, place, and time. She appears well-developed. No distress.  HENT:  Head: Normocephalic and atraumatic.  Right Ear: Hearing, tympanic membrane, external ear and ear canal normal.  Left Ear: Hearing, tympanic membrane, external ear and ear canal normal.  Mouth/Throat: Uvula is midline, oropharynx is clear and moist and  mucous membranes are normal.  Eyes: Pupils are equal, round, and reactive to light. Conjunctivae and EOM are normal.  Neck: No tracheal deviation present. No thyromegaly present.  Cardiovascular: Normal rate and regular rhythm.  No murmur heard. Pulses:      Dorsalis pedis pulses are 2+ on the right side and 2+ on the left side.  Respiratory: Effort normal and breath sounds normal. No respiratory distress.  GI: Soft. She exhibits no mass. There is no hepatomegaly. There is no abdominal tenderness.  Genitourinary:    Genitourinary Comments: Deferred to gyn.   Musculoskeletal:        General: No edema.     Comments: No major deformity or signs of synovitis appreciated.  Lymphadenopathy:    She has no cervical adenopathy.       Right: No supraclavicular adenopathy present.       Left: No supraclavicular adenopathy present.  Neurological: She is alert and oriented to person, place, and time. She has normal strength. No cranial nerve deficit. Coordination and gait normal.  Reflex Scores:      Bicep reflexes are 2+ on the right side and 2+ on the left side.      Patellar reflexes are 2+ on the right side and 2+ on the left side. Skin: Skin is warm. No rash noted. No erythema.  Psychiatric: Her mood appears anxious. Cognition and memory are normal.  Well groomed, good eye contact.    ASSESSMENT AND PLAN:  Ms. Judith Moore was here today annual physical examination.  Orders Placed This Encounter  Procedures  . Basic metabolic panel  . CBC  . Hemoglobin A1c  . Lipid panel  . TSH  . Vitamin B12  . Vitamin B1, whole blood  . VITAMIN D 25 Hydroxy (Vit-D Deficiency, Fractures)    Lab Results  Component Value Date   HGBA1C 5.8 07/05/2019   Lab Results  Component Value Date   TSH 0.55 07/05/2019   Lab Results  Component Value Date   CHOL 165 07/05/2019   HDL 58.20 07/05/2019   LDLCALC 97 07/05/2019   TRIG 52.0 07/05/2019   CHOLHDL 3 07/05/2019   Lab Results  Component  Value Date   WBC 4.3 07/05/2019   HGB 11.9 (L) 07/05/2019   HCT 36.3 07/05/2019   MCV 84.3 07/05/2019   PLT 278.0 07/05/2019   Lab Results  Component Value Date   VITAMINB12 275 07/05/2019   Lab Results  Component Value Date   CREATININE 0.91 07/05/2019   BUN 10 07/05/2019   NA 138 07/05/2019   K 4.0 07/05/2019   CL 103 07/05/2019   CO2 27 07/05/2019    Routine general medical examination at a health care facility We discussed the importance of regular physical activity and healthy diet for prevention of chronic illness and/or complications. Preventive guidelines reviewed. Vaccination: She is not sure about last Tdap. Refused vaccination.  Continue following with gyn for her female preventive care. Next CPE in a year.  Vitamin D deficiency, unspecified Recommendations will be given according to 25 OH vit D results.  -     VITAMIN D 25 Hydroxy (Vit-D Deficiency, Fractures)  Anxiety disorder, unspecified type Problem is not well controlled. Fluoxetine was helping,so she would like to resume it. Side effects discussed. Instructed about warning signs.  -     FLUoxetine (PROZAC) 20 MG tablet; Take 1 tablet (20 mg total) by mouth daily.  Bariatric surgery status Recommend being more consistent with taking gummy multiv. Further recommendations will be given according to lab results.  -     CBC -     Vitamin B12 -     Vitamin B1, whole blood -     VITAMIN D 25 Hydroxy (Vit-D Deficiency, Fractures)  Screening for lipoid disorders -     Lipid panel  Screening for endocrine, metabolic and immunity disorder -     Basic metabolic panel -     Hemoglobin A1c  Hyperthyroidism She was on Tapazole in 2016, discontinued because normalization of TSH. Asymptomatic.  Further recommendations will be given according to TSH results.  -     TSH  Return in 3 months (on 10/04/2019) for anxiety and depression,vit D def.  Judith Pinson G. [place], MD  [facility]. [place]  office.

## 2019-07-05 NOTE — Patient Instructions (Signed)
A few things to remember from today's visit:   Routine general medical examination at a health care facility  Vitamin D deficiency, unspecified - Plan: VITAMIN D 25 Hydroxy (Vit-D Deficiency, Fractures)  Anxiety disorder, unspecified type - Plan: FLUoxetine (PROZAC) 20 MG tablet  Bariatric surgery status - Plan: CBC, Vitamin B12, Vitamin B1, whole blood, VITAMIN D 25 Hydroxy (Vit-D Deficiency, Fractures)  Hypothyroidism, unspecified type - Plan: TSH  Screening for lipoid disorders - Plan: Lipid panel  Screening for endocrine, metabolic and immunity disorder - Plan: Basic metabolic panel, Hemoglobin A1c  Today you have you routine preventive visit.  At least 150 minutes of moderate exercise per week, daily brisk walking for 15-30 min is a good exercise option. Healthy diet low in saturated (animal) fats and sweets and consisting of fresh fruits and vegetables, lean meats such as fish and white chicken and whole grains.  These are some of recommendations for screening depending of age and risk factors:   - Vaccines:  Tdap vaccine every 10 years.  Shingles vaccine recommended at age 42, could be given after 34 years of age but not sure about insurance coverage.   Pneumonia vaccines:  Prevnar 13 at 65 and Pneumovax at 11. Sometimes Pneumovax is giving earlier if history of smoking, lung disease,diabetes,kidney disease among some.    Screening for diabetes at age 35 and every 3 years.  Cervical cancer prevention:  Pap smear starts at 34 years of age and continues periodically until 34 years old in low risk women. Pap smear every 3 years between 39 and 23 years old. Pap smear every 3-5 years between women 39 and older if pap smear negative and HPV screening negative.   -Breast cancer: Mammogram: There is disagreement between experts about when to start screening in low risk asymptomatic female but recent recommendations are to start screening at 79 and not later than 34 years old  , every 1-2 years and after 34 yo q 2 years. Screening is recommended until 34 years old but some women can continue screening depending of healthy issues.   Colon cancer screening: starts at 34 years old until 34 years old.  Cholesterol disorder screening at age 51 and every 3 years.  Also recommended:  1. Dental visit- Brush and floss your teeth twice daily; visit your dentist twice a year. 2. Eye doctor- Get an eye exam at least every 2 years. 3. Helmet use- Always wear a helmet when riding a bicycle, motorcycle, rollerblading or skateboarding. 4. Safe sex- If you may be exposed to sexually transmitted infections, use a condom. 5. Seat belts- Seat belts can save your live; always wear one. 6. Smoke/Carbon Monoxide detectors- These detectors need to be installed on the appropriate level of your home. Replace batteries at least once a year. 7. Skin cancer- When out in the sun please cover up and use sunscreen 15 SPF or higher. 8. Violence- If anyone is threatening or hurting you, please tell your healthcare provider.  9. Drink alcohol in moderation- Limit alcohol intake to one drink or less per day. Never drink and drive.   Please be sure medication list is accurate. If a new problem present, please set up appointment sooner than planned today.

## 2019-07-08 ENCOUNTER — Encounter: Payer: Self-pay | Admitting: Family Medicine

## 2019-07-12 ENCOUNTER — Ambulatory Visit (INDEPENDENT_AMBULATORY_CARE_PROVIDER_SITE_OTHER): Payer: BC Managed Care – PPO | Admitting: *Deleted

## 2019-07-12 ENCOUNTER — Other Ambulatory Visit: Payer: Self-pay

## 2019-07-12 DIAGNOSIS — E538 Deficiency of other specified B group vitamins: Secondary | ICD-10-CM

## 2019-07-12 MED ORDER — CYANOCOBALAMIN 1000 MCG/ML IJ SOLN
1000.0000 ug | Freq: Once | INTRAMUSCULAR | Status: AC
  Administered 2019-07-12: 1000 ug via INTRAMUSCULAR

## 2019-07-12 NOTE — Progress Notes (Signed)
Per orders of Dr. Jordan, injection of Vitamin B 12 given by QuaNeisha S Jones. Patient tolerated injection well. 

## 2019-07-17 LAB — VITAMIN B1: Vitamin B1 (Thiamine): <6 nmol/L — ABNORMAL LOW (ref 8–30)

## 2019-07-21 ENCOUNTER — Encounter: Payer: Self-pay | Admitting: Family Medicine

## 2019-10-24 ENCOUNTER — Other Ambulatory Visit: Payer: Self-pay

## 2019-10-24 ENCOUNTER — Emergency Department (HOSPITAL_COMMUNITY)
Admission: EM | Admit: 2019-10-24 | Discharge: 2019-10-24 | Disposition: A | Discharge status: Home / Self Care | Payer: 59 | Attending: Emergency Medicine | Admitting: Emergency Medicine

## 2019-10-24 ENCOUNTER — Encounter (HOSPITAL_COMMUNITY): Payer: Self-pay

## 2019-10-24 DIAGNOSIS — R109 Unspecified abdominal pain: Secondary | ICD-10-CM | POA: Diagnosis present

## 2019-10-24 DIAGNOSIS — Z5321 Procedure and treatment not carried out due to patient leaving prior to being seen by health care provider: Secondary | ICD-10-CM | POA: Insufficient documentation

## 2019-10-24 LAB — URINALYSIS, ROUTINE W REFLEX MICROSCOPIC
Bilirubin Urine: NEGATIVE
Glucose, UA: NEGATIVE mg/dL
Ketones, ur: NEGATIVE mg/dL
Leukocytes,Ua: NEGATIVE
Nitrite: NEGATIVE
Protein, ur: NEGATIVE mg/dL
Specific Gravity, Urine: 1.016 (ref 1.005–1.030)
pH: 5 (ref 5.0–8.0)

## 2019-10-24 MED ORDER — SODIUM CHLORIDE 0.9% FLUSH: 3.0000 mL | Freq: Once | INTRAVENOUS | Status: DC

## 2019-10-24 NOTE — ED Triage Notes (Signed)
Pt states she is having RLQ pain x1 hour. Pt has hx of gastric sleeve and gallstones. Pt endorses nausea x 3 days.

## 2019-10-30 ENCOUNTER — Ambulatory Visit (INDEPENDENT_AMBULATORY_CARE_PROVIDER_SITE_OTHER): Payer: 59 | Admitting: Family Medicine

## 2019-10-30 ENCOUNTER — Encounter: Payer: Self-pay | Admitting: Family Medicine

## 2019-10-30 ENCOUNTER — Other Ambulatory Visit: Payer: Self-pay

## 2019-10-30 VITALS — BP 130/80 | HR 76 | Temp 96.0°F | Resp 12 | Ht 66.0 in | Wt 187.0 lb

## 2019-10-30 DIAGNOSIS — Z9884 Bariatric surgery status: Secondary | ICD-10-CM | POA: Diagnosis not present

## 2019-10-30 DIAGNOSIS — K219 Gastro-esophageal reflux disease without esophagitis: Secondary | ICD-10-CM | POA: Diagnosis not present

## 2019-10-30 DIAGNOSIS — R1011 Right upper quadrant pain: Secondary | ICD-10-CM

## 2019-10-30 DIAGNOSIS — E538 Deficiency of other specified B group vitamins: Secondary | ICD-10-CM

## 2019-10-30 DIAGNOSIS — E559 Vitamin D deficiency, unspecified: Secondary | ICD-10-CM

## 2019-10-30 LAB — HEPATIC FUNCTION PANEL
ALT: 12 U/L (ref 0–35)
AST: 12 U/L (ref 0–37)
Albumin: 4 g/dL (ref 3.5–5.2)
Alkaline Phosphatase: 47 U/L (ref 39–117)
Bilirubin, Direct: 0.1 mg/dL (ref 0.0–0.3)
Total Bilirubin: 0.4 mg/dL (ref 0.2–1.2)
Total Protein: 7 g/dL (ref 6.0–8.3)

## 2019-10-30 LAB — CBC
HCT: 38.3 % (ref 36.0–46.0)
Hemoglobin: 12.4 g/dL (ref 12.0–15.0)
MCHC: 32.5 g/dL (ref 30.0–36.0)
MCV: 84.3 fl (ref 78.0–100.0)
Platelets: 288 10*3/uL (ref 150.0–400.0)
RBC: 4.54 Mil/uL (ref 3.87–5.11)
RDW: 13.1 % (ref 11.5–15.5)
WBC: 5.4 10*3/uL (ref 4.0–10.5)

## 2019-10-30 LAB — VITAMIN B12: Vitamin B-12: 269 pg/mL (ref 211–911)

## 2019-10-30 LAB — VITAMIN D 25 HYDROXY (VIT D DEFICIENCY, FRACTURES): VITD: 10.06 ng/mL — ABNORMAL LOW (ref 30.00–100.00)

## 2019-10-30 MED ORDER — OMEPRAZOLE 40 MG PO CPDR: 40.0000 mg | DELAYED_RELEASE_CAPSULE | Freq: Every day | ORAL | 2 refills | Status: DC

## 2019-10-30 MED ORDER — VITAMIN D (ERGOCALCIFEROL) 1.25 MG (50000 UNIT) PO CAPS: ORAL_CAPSULE | ORAL | 1 refills | Status: DC

## 2019-10-30 NOTE — Progress Notes (Signed)
HPI:  Chief Complaint  Patient presents with  . Abdominal Pain    right lower quadrant    Judith Moore is a 34 y.o. female, who is here today complaining of RUQ abdominal pain a week ago that lasted 1 day. Sudden onset after drinking a glass of cranberry juice. Constant sharp pain, 6/10, occasionally radiated to right low back and right shoulder. She also has an intermittent stabbing sensation, 8/10. She has some associated nausea for 2 days.  She has not identified exacerbating or alleviating factors. She was in the ER, 10/24/2019, she left without being seen. Problem has resolved with no treatment.  She has been having some constipation, which is not unusual for her. She takes Senokot every 3 days, has a bowel movement every 4 to 5 days. Negative for blood in the stool, melena, or vomiting.  + Heartburn, she takes Tums as needed, which help. Exacerbated by certain foods.  Vitamin D deficiency: Currently she is on vitamin D supplementation, she does not remember dose. Last 25 OH vitamin D on 07/05/2019 was low at 12.4.  B12 deficiency, she is also taking daily B12 supplementation. In 06/2019 B12 was in lower normal range, 275.  Status post bariatric procedure, gastric sleeve.  Vitamin B-1 was< 6.  Review of Systems  Constitutional: Negative for activity change, appetite change, fatigue and fever.  HENT: Negative for mouth sores, sore throat and trouble swallowing.   Respiratory: Negative for cough, shortness of breath and wheezing.   Cardiovascular: Negative for chest pain, palpitations and leg swelling.  Gastrointestinal:       Negative for changes in bowel habits.  Genitourinary: Negative for decreased urine volume, dysuria and hematuria.  Neurological: Negative for syncope and headaches.  Rest see pertinent positives and negatives per HPI.  Current Outpatient Medications on File Prior to Visit  Medication Sig Dispense Refill  . BLISOVI 24 FE 1-20  MG-MCG(24) tablet TAKE 1 TABLET BY MOUTH EVERY DAY 84 tablet 2  . FLUoxetine (PROZAC) 20 MG tablet Take 1 tablet (20 mg total) by mouth daily. 90 tablet 1  . cetirizine (ZYRTEC ALLERGY) 10 MG tablet Take 1 tablet (10 mg total) by mouth daily. (Patient not taking: Reported on 07/05/2019) 30 tablet 11  . fluticasone (FLONASE) 50 MCG/ACT nasal spray Place 1 spray into both nostrils daily. (Patient not taking: Reported on 07/05/2019) 16 g 4   No current facility-administered medications on file prior to visit.   Past Medical History:  Diagnosis Date  . Hyperthyroidism    Allergies  Allergen Reactions  . Almond (Diagnostic) Hives and Swelling    Almonds    Social History   Socioeconomic History  . Marital status: Divorced    Spouse name: SEPARATED  . Number of children: 2  . Years of education: 15+  . Highest education level: Not on file  Occupational History  . Occupation: Medical illustrator: [employer]  Tobacco Use  . Smoking status: Never Smoker  . Smokeless tobacco: Never Used  Substance and Sexual Activity  . Alcohol use: Not Currently  . Drug use: No  . Sexual activity: Yes    Partners: Male    Birth control/protection: I.U.D.    Comment: Mirena  Other Topics Concern  . Not on file  Social History Narrative   Lives with her two sons, her uncle and cousin. Separated from husband 11/2010.   Social Determinants of Health   Financial Resource Strain:   .  Difficulty of Paying Living Expenses: Not on file  Food Insecurity:   . Worried About Programme researcher, broadcasting/film/video in the Last Year: Not on file  . Ran Out of Food in the Last Year: Not on file  Transportation Needs:   . Lack of Transportation (Medical): Not on file  . Lack of Transportation (Non-Medical): Not on file  Physical Activity:   . Days of Exercise per Week: Not on file  . Minutes of Exercise per Session: Not on file  Stress:   . Feeling of Stress : Not on file  Social Connections:   .  Frequency of Communication with Friends and Family: Not on file  . Frequency of Social Gatherings with Friends and Family: Not on file  . Attends Religious Services: Not on file  . Active Member of Clubs or Organizations: Not on file  . Attends Banker Meetings: Not on file  . Marital Status: Not on file    Vitals:   10/30/19 0738  BP: 130/80  Pulse: 76  Resp: 12  Temp: (!) 96 F (35.6 C)   Body mass index is 30.18 kg/m.  Physical Exam  Nursing note and vitals reviewed. Constitutional: She is oriented to person, place, and time. She appears well-developed. She does not appear ill. No distress.  HENT:  Head: Normocephalic and atraumatic.  Mouth/Throat: Oropharynx is clear and moist and mucous membranes are normal.  Eyes: Conjunctivae are normal. No scleral icterus.  Cardiovascular: Normal rate and regular rhythm.  No murmur heard. Respiratory: Effort normal and breath sounds normal. No respiratory distress.  GI: Soft. Bowel sounds are normal. She exhibits no distension and no mass. There is no hepatomegaly. There is no abdominal tenderness.  Musculoskeletal:        General: No edema.  Lymphadenopathy:    She has no cervical adenopathy.       Right: No supraclavicular adenopathy present.       Left: No supraclavicular adenopathy present.  Neurological: She is alert and oriented to person, place, and time. She has normal strength. Gait normal.  Skin: Skin is warm. No rash noted. No erythema.  Psychiatric: She has a normal mood and affect.  Well groomed, good eye contact.    ASSESSMENT AND PLAN:  Judith Moore was seen today for abdominal pain.  Diagnoses and all orders for this visit:  Orders Placed This Encounter  Procedures  . US Abdomen Limited RUQ  . Hepatic function panel  . Vitamin B12  . VITAMIN D 25 Hydroxy (Vit-D Deficiency, Fractures)  . CBC  . Vitamin B1, whole blood   Lab Results  Component Value Date   WBC 5.4 10/30/2019   HGB 12.4  10/30/2019   HCT 38.3 10/30/2019   MCV 84.3 10/30/2019   PLT 288.0 10/30/2019   Lab Results  Component Value Date   VITAMINB12 269 10/30/2019   Lab Results  Component Value Date   ALT 12 10/30/2019   AST 12 10/30/2019   ALKPHOS 47 10/30/2019   BILITOT 0.4 10/30/2019    Abdominal pain, RUQ We discussed possible etiologies. History suggest cholelithiasis. Currently she is asymptomatic. Further recommendation will be given according to lab/results.  -     Hepatic function panel -     CBC -     US Abdomen Limited RUQ; Future  Gastroesophageal reflux disease, unspecified whether esophagitis present GERD precautions. Omeprazole 40 mg 30-minute before breakfast recommended for 2 to 3 months, then she can continue with OTC Pepcid 20 mg  at bedtime. We discussed possible complications of acid reflux.  -     omeprazole (PRILOSEC) 40 MG capsule; Take 1 capsule (40 mg total) by mouth daily.  Vitamin D deficiency, unspecified Continue vitamin D supplementation, further recommendation will be given according to 25 OH vitamin D results.  Vitamin B 12 deficiency Further recommendation will be given according to lab results.  Bariatric surgery status -     Vitamin B12 -     Vitamin B1, whole blood.   Return if symptoms worsen or fail to improve.   Kelen Laura G. [place], MD  [facility]. [place] office.

## 2019-10-30 NOTE — Patient Instructions (Addendum)
A few things to remember from today's visit:   Vitamin D deficiency, unspecified - Plan: VITAMIN D 25 Hydroxy (Vit-D Deficiency, Fractures)  Abdominal pain, RUQ - Plan: Hepatic function panel, CBC, US Abdomen Limited RUQ  Vitamin B 12 deficiency - Plan: Vitamin B12, CBC  Bariatric surgery status - Plan: Vitamin B1, whole blood, Vitamin B12  Cholelithiasis  Cholelithiasis is a form of gallbladder disease in which gallstones form in the gallbladder. The gallbladder is an organ that stores bile. Bile is made in the liver, and it helps to digest fats. Gallstones begin as small crystals and slowly grow into stones. They may cause no symptoms until the gallbladder tightens (contracts) and a gallstone is blocking the duct (gallbladder attack), which can cause pain. Cholelithiasis is also referred to as gallstones. There are two main types of gallstones:  Cholesterol stones. These are made of hardened cholesterol and are usually yellow-green in color. They are the most common type of gallstone. Cholesterol is a white, waxy, fat-like substance that is made in the liver.  Pigment stones. These are dark in color and are made of a red-yellow substance that forms when hemoglobin from red blood cells breaks down (bilirubin). What are the causes? This condition may be caused by an imbalance in the substances that bile is made of. This can happen if the bile:  Has too much bilirubin.  Has too much cholesterol.  Does not have enough bile salts. These salts help the body absorb and digest fats. In some cases, this condition can also be caused by the gallbladder not emptying completely or often enough. What increases the risk? The following factors may make you more likely to develop this condition:  Being female.  Having multiple pregnancies. Health care providers sometimes advise removing diseased gallbladders before future pregnancies.  Eating a diet that is heavy in fried foods, fat, and refined  carbohydrates, like white bread and white rice.  Being obese.  Being older than age 69.  Prolonged use of medicines that contain female hormones (estrogen).  Having diabetes mellitus.  Rapidly losing weight.  Having a family history of gallstones.  Being of American [place] or [place] descent.  Having an intestinal disease such as Crohn disease.  Having metabolic syndrome.  Having cirrhosis.  Having severe types of anemia such as sickle cell anemia. What are the signs or symptoms? In most cases, there are no symptoms. These are known as silent gallstones. If a gallstone blocks the bile ducts, it can cause a gallbladder attack. The main symptom of a gallbladder attack is sudden pain in the upper right abdomen. The pain usually comes at night or after eating a large meal. The pain can last for one or several hours and can spread to the right shoulder or chest. If the bile duct is blocked for more than a few hours, it can cause infection or inflammation of the gallbladder, liver, or pancreas, which may cause:  Nausea.  Vomiting.  Abdominal pain that lasts for 5 hours or more.  Fever or chills.  Yellowing of the skin or the whites of the eyes (jaundice).  Dark urine.  Light-colored stools. How is this diagnosed? This condition may be diagnosed based on:  A physical exam.  Your medical history.  An ultrasound of your gallbladder.  CT scan.  MRI.  Blood tests to check for signs of infection or inflammation.  A scan of your gallbladder and bile ducts (biliary system) using nonharmful radioactive material and special cameras that can  see the radioactive material (cholescintigram). This test checks to see how your gallbladder contracts and whether bile ducts are blocked.  Inserting a small tube with a camera on the end (endoscope) through your mouth to inspect bile ducts and check for blockages (endoscopic retrograde cholangiopancreatogram). How is this  treated? Treatment for gallstones depends on the severity of the condition. Silent gallstones do not need treatment. If the gallstones cause a gallbladder attack or other symptoms, treatment may be required. Options for treatment include:  Surgery to remove the gallbladder (cholecystectomy). This is the most common treatment.  Medicines to dissolve gallstones. These are most effective at treating small gallstones. You may need to take medicines for up to 6-12 months.  Shock wave treatment (extracorporeal biliary lithotripsy). In this treatment, an ultrasound machine sends shock waves to the gallbladder to break gallstones into smaller pieces. These pieces can then be passed into the intestines or be dissolved by medicine. This is rarely used.  Removing gallstones through endoscopic retrograde cholangiopancreatogram. A small basket can be attached to the endoscope and used to capture and remove gallstones. Follow these instructions at home:  Take over-the-counter and prescription medicines only as told by your health care provider.  Maintain a healthy weight and follow a healthy diet. This includes: ? Reducing fatty foods, such as fried food. ? Reducing refined carbohydrates, like white bread and white rice. ? Increasing fiber. Aim for foods like almonds, fruit, and beans.  Keep all follow-up visits as told by your health care provider. This is important. Contact a health care provider if:  You think you have had a gallbladder attack.  You have been diagnosed with silent gallstones and you develop abdominal pain or indigestion. Get help right away if:  You have pain from a gallbladder attack that lasts for more than 2 hours.  You have abdominal pain that lasts for more than 5 hours.  You have a fever or chills.  You have persistent nausea and vomiting.  You develop jaundice.  You have dark urine or light-colored stools. Summary  Cholelithiasis (also called gallstones) is a form  of gallbladder disease in which gallstones form in the gallbladder.  This condition is caused by an imbalance in the substances that make up bile. This can happen if the bile has too much cholesterol, too much bilirubin, or not enough bile salts.  You are more likely to develop this condition if you are female, pregnant, using medicines with estrogen, obese, older than age 72, or have a family history of gallstones. You may also develop gallstones if you have diabetes, an intestinal disease, cirrhosis, or metabolic syndrome.  Treatment for gallstones depends on the severity of the condition. Silent gallstones do not need treatment.  If gallstones cause a gallbladder attack or other symptoms, treatment may be needed. The most common treatment is surgery to remove the gallbladder. This information is not intended to replace advice given to you by your health care provider. Make sure you discuss any questions you have with your health care provider. Document Revised: 09/10/2017 Document Reviewed: 06/14/2016 Elsevier Patient Education  [place].   Please be sure medication list is accurate. If a new problem present, please set up appointment sooner than planned today.

## 2019-11-08 ENCOUNTER — Ambulatory Visit
Admission: RE | Admit: 2019-11-08 | Discharge: 2019-11-08 | Disposition: A | Discharge status: Home / Self Care | Payer: 59 | Source: Ambulatory Visit | Attending: Family Medicine | Admitting: Family Medicine

## 2019-11-08 DIAGNOSIS — R1011 Right upper quadrant pain: Secondary | ICD-10-CM

## 2019-12-04 ENCOUNTER — Other Ambulatory Visit: Payer: Self-pay | Admitting: Obstetrics

## 2020-01-23 ENCOUNTER — Other Ambulatory Visit: Payer: Self-pay | Admitting: Family Medicine

## 2020-01-23 DIAGNOSIS — K219 Gastro-esophageal reflux disease without esophagitis: Secondary | ICD-10-CM

## 2020-07-16 ENCOUNTER — Encounter: Payer: 59 | Admitting: Family Medicine

## 2020-07-18 ENCOUNTER — Other Ambulatory Visit (HOSPITAL_COMMUNITY)
Admission: RE | Admit: 2020-07-18 | Discharge: 2020-07-18 | Disposition: A | Discharge status: Home / Self Care | Payer: No Typology Code available for payment source | Source: Ambulatory Visit | Attending: Obstetrics | Admitting: Obstetrics

## 2020-07-18 ENCOUNTER — Ambulatory Visit (INDEPENDENT_AMBULATORY_CARE_PROVIDER_SITE_OTHER): Payer: No Typology Code available for payment source | Admitting: Obstetrics

## 2020-07-18 ENCOUNTER — Encounter: Payer: Self-pay | Admitting: Obstetrics

## 2020-07-18 ENCOUNTER — Other Ambulatory Visit: Payer: Self-pay

## 2020-07-18 VITALS — BP 123/81 | HR 67 | Wt 178.3 lb

## 2020-07-18 DIAGNOSIS — Z01419 Encounter for gynecological examination (general) (routine) without abnormal findings: Secondary | ICD-10-CM | POA: Diagnosis present

## 2020-07-18 DIAGNOSIS — Z3041 Encounter for surveillance of contraceptive pills: Secondary | ICD-10-CM

## 2020-07-18 DIAGNOSIS — J309 Allergic rhinitis, unspecified: Secondary | ICD-10-CM

## 2020-07-18 DIAGNOSIS — N939 Abnormal uterine and vaginal bleeding, unspecified: Secondary | ICD-10-CM | POA: Diagnosis not present

## 2020-07-18 MED ORDER — CETIRIZINE HCL 10 MG PO TABS: 10.0000 mg | ORAL_TABLET | Freq: Every day | ORAL | 11 refills | Status: DC

## 2020-07-18 MED ORDER — LEVONORGESTREL-ETHINYL ESTRAD 0.15-30 MG-MCG PO TABS: 1.0000 | ORAL_TABLET | Freq: Every day | ORAL | 11 refills | Status: DC

## 2020-07-18 NOTE — Progress Notes (Signed)
Pt is here for annual gyn exam.   Last pap: 04/2018, normal

## 2020-07-18 NOTE — Progress Notes (Signed)
Subjective:        Judith Moore is a 34 y.o. female here for a routine exam.  Current complaints: Irregular spotting with OCP's.  Periods are also heavy.  Personal health questionnaire:  Is patient [name], have a family history of breast and/or ovarian cancer: no Is there a family history of uterine cancer diagnosed at age < 23, gastrointestinal cancer, urinary tract cancer, family member who is a Personnel officer syndrome-associated carrier: no Is the patient overweight and hypertensive, family history of diabetes, personal history of gestational diabetes, preeclampsia or PCOS: no Is patient over 38, have PCOS,  family history of premature CHD under age 48, diabetes, smoke, have hypertension or peripheral artery disease:  no At any time, has a partner hit, kicked or otherwise hurt or frightened you?: no Over the past 2 weeks, have you felt down, depressed or hopeless?: no Over the past 2 weeks, have you felt little interest or pleasure in doing things?:no   Gynecologic History Patient's last menstrual period was 07/09/2020 (approximate). Contraception: OCP (estrogen/progesterone) Last Pap: 2019. Results were: normal Last mammogram: n/a. Results were: n/a  Obstetric History OB History  Gravida Para Term Preterm AB Living  2 2 1 1  0 2  SAB TAB Ectopic Multiple Live Births  0 0 0 0 2    # Outcome Date GA Lbr Len/2nd Weight Sex Delivery Anes PTL Lv  2 Preterm           1 Term             Past Medical History:  Diagnosis Date  . Hyperthyroidism     Past Surgical History:  Procedure Laterality Date  . ADENOIDECTOMY    . CESAREAN SECTION  09/28/2005, 11/28/2007  . LAPAROSCOPIC APPENDECTOMY N/A 01/06/2016   Procedure: APPENDECTOMY LAPAROSCOPIC;  Surgeon: 01/08/2016, MD;  Location: [facility];  Service: General;  Laterality: N/A;  . stomach banding    . TONSILLECTOMY       Current Outpatient Medications:  .  BLISOVI 24 FE 1-20 MG-MCG(24) tablet, TAKE 1 TABLET BY MOUTH  EVERY DAY, Disp: 84 tablet, Rfl: 2 .  cetirizine (ZYRTEC ALLERGY) 10 MG tablet, Take 1 tablet (10 mg total) by mouth daily., Disp: 30 tablet, Rfl: 11 .  FLUoxetine (PROZAC) 20 MG tablet, Take 1 tablet (20 mg total) by mouth daily., Disp: 90 tablet, Rfl: 1 .  omeprazole (PRILOSEC) 40 MG capsule, TAKE 1 CAPSULE BY MOUTH EVERY DAY, Disp: 90 capsule, Rfl: 0 .  Vitamin D, Ergocalciferol, (DRISDOL) 1.25 MG (50000 UNIT) CAPS capsule, 1 cap weekly x 8 weeks then q 2 weeks., Disp: 12 capsule, Rfl: 1 .  fluticasone (FLONASE) 50 MCG/ACT nasal spray, Place 1 spray into both nostrils daily. (Patient not taking: Reported on 07/05/2019), Disp: 16 g, Rfl: 4 Allergies  Allergen Reactions  . Almond (Diagnostic) Hives and Swelling    Almonds    Social History   Tobacco Use  . Smoking status: Never Smoker  . Smokeless tobacco: Never Used  Substance Use Topics  . Alcohol use: Not Currently    Family History  Problem Relation Age of Onset  . Hypertension Mother   . Thyroid disease Mother        multinodular goiter  . Diabetes Maternal Uncle   . Healthy Brother        x1  . Hypertension Maternal Grandmother   . Hypertension Maternal Grandfather   . Cancer Neg Hx   . Heart disease Neg Hx  Review of Systems  Constitutional: negative for fatigue and weight loss Respiratory: negative for cough and wheezing Cardiovascular: negative for chest pain, fatigue and palpitations Gastrointestinal: negative for abdominal pain and change in bowel habits Musculoskeletal:negative for myalgias Neurological: negative for gait problems and tremors Behavioral/Psych: negative for abusive relationship, depression Endocrine: negative for temperature intolerance    Genitourinary: positive for abnormal menstrual periods.  Negative for genital lesions, hot flashes, sexual problems and vaginal discharge Integument/breast: negative for breast lump, breast tenderness, nipple discharge and skin lesion(s)    Objective:        Wt 178 lb 4.8 oz (80.9 kg)   LMP 07/09/2020 (Approximate)   BMI 28.78 kg/m  General:   alert and no distress  Skin:   no rash or abnormalities  Lungs:   clear to auscultation bilaterally  Heart:   regular rate and rhythm, S1, S2 normal, no murmur, click, rub or gallop  Breasts:   normal without suspicious masses, skin or nipple changes or axillary nodes  Abdomen:  normal findings: no organomegaly, soft, non-tender and no hernia  Pelvis:  External genitalia: normal general appearance Urinary system: urethral meatus normal and bladder without fullness, nontender Vaginal: normal without tenderness, induration or masses Cervix: normal appearance Adnexa: normal bimanual exam Uterus: anteverted and non-tender, normal size   Lab Review Urine pregnancy test Labs reviewed yes Radiologic studies reviewed no  50% of 20 min visit spent on counseling and coordination of care.   Assessment:     1. Encounter for routine gynecological examination with Papanicolaou smear of cervix Rx: - Cytology - PAP( [facility])  2. Abnormal uterine bleeding (AUB) - possible withdrawal bleeding from missing pills - patient will try to take her pills daily   3. Encounter for surveillance of contraceptive pills - D/C Blisovi Fe Rx: - levonorgestrel-ethinyl estradiol (NORDETTE) 0.15-30 MG-MCG tablet; Take 1 tablet by mouth daily.  Dispense: 28 tablet; Refill: 11  4. Allergic rhinitis, unspecified seasonality, unspecified trigger Rx: - cetirizine (ZYRTEC ALLERGY) 10 MG tablet; Take 1 tablet (10 mg total) by mouth daily.  Dispense: 30 tablet; Refill: 11    Plan:    Education reviewed: calcium supplements, depression evaluation, low fat, low cholesterol diet, safe sex/STD prevention, self breast exams and weight bearing exercise. Contraception: OCP (estrogen/progesterone). Follow up in: 3 months.  MyChart.    Brock Bad, MD 07/18/2020 8:14 AM

## 2020-07-22 LAB — CYTOLOGY - PAP
Comment: NEGATIVE
Diagnosis: UNDETERMINED — AB
High risk HPV: NEGATIVE

## 2020-07-29 ENCOUNTER — Ambulatory Visit (INDEPENDENT_AMBULATORY_CARE_PROVIDER_SITE_OTHER): Payer: No Typology Code available for payment source | Admitting: Family Medicine

## 2020-07-29 ENCOUNTER — Other Ambulatory Visit: Payer: Self-pay

## 2020-07-29 ENCOUNTER — Encounter: Payer: Self-pay | Admitting: Family Medicine

## 2020-07-29 VITALS — BP 138/88 | HR 74 | Temp 98.4°F | Resp 12 | Ht 66.5 in | Wt 175.8 lb

## 2020-07-29 DIAGNOSIS — E559 Vitamin D deficiency, unspecified: Secondary | ICD-10-CM

## 2020-07-29 DIAGNOSIS — Z13220 Encounter for screening for lipoid disorders: Secondary | ICD-10-CM

## 2020-07-29 DIAGNOSIS — E538 Deficiency of other specified B group vitamins: Secondary | ICD-10-CM

## 2020-07-29 DIAGNOSIS — Z0000 Encounter for general adult medical examination without abnormal findings: Secondary | ICD-10-CM

## 2020-07-29 DIAGNOSIS — J309 Allergic rhinitis, unspecified: Secondary | ICD-10-CM

## 2020-07-29 DIAGNOSIS — E039 Hypothyroidism, unspecified: Secondary | ICD-10-CM | POA: Diagnosis not present

## 2020-07-29 DIAGNOSIS — Z9884 Bariatric surgery status: Secondary | ICD-10-CM

## 2020-07-29 DIAGNOSIS — Z1329 Encounter for screening for other suspected endocrine disorder: Secondary | ICD-10-CM

## 2020-07-29 DIAGNOSIS — Z13228 Encounter for screening for other metabolic disorders: Secondary | ICD-10-CM

## 2020-07-29 DIAGNOSIS — Z23 Encounter for immunization: Secondary | ICD-10-CM

## 2020-07-29 DIAGNOSIS — Z130 Encounter for screening for diseases of the blood and blood-forming organs and certain disorders involving the immune mechanism: Secondary | ICD-10-CM

## 2020-07-29 DIAGNOSIS — F419 Anxiety disorder, unspecified: Secondary | ICD-10-CM

## 2020-07-29 MED ORDER — VITAMIN D (ERGOCALCIFEROL) 1.25 MG (50000 UNIT) PO CAPS: ORAL_CAPSULE | ORAL | 1 refills | Status: DC

## 2020-07-29 MED ORDER — FLUTICASONE PROPIONATE 50 MCG/ACT NA SUSP: 1.0000 | Freq: Every day | NASAL | 99 refills | Status: DC

## 2020-07-29 MED ORDER — HYDROXYZINE HCL 25 MG PO TABS: 25.0000 mg | ORAL_TABLET | Freq: Three times a day (TID) | ORAL | 1 refills | Status: DC | PRN

## 2020-07-29 NOTE — Patient Instructions (Addendum)
Today you have you routine preventive visit. A few things to remember from today's visit:   Routine general medical examination at a health care facility  Vitamin B 12 deficiency - Plan: Vitamin B12  Vitamin D deficiency, unspecified - Plan: VITAMIN D 25 Hydroxy (Vit-D Deficiency, Fractures), Vitamin D, Ergocalciferol, (DRISDOL) 1.25 MG (50000 UNIT) CAPS capsule  Hypothyroidism, unspecified type  Allergic rhinitis, unspecified seasonality, unspecified trigger - Plan: fluticasone (FLONASE) 50 MCG/ACT nasal spray  Screening for lipoid disorders - Plan: Lipid panel  Screening for endocrine, metabolic and immunity disorder - Plan: BASIC METABOLIC PANEL WITH GFR  Today we started Hydroxyzine to take as needed for anxiety. If not better we will need daily medication.  If you need refills please call your pharmacy. Do not use My Chart to request refills or for acute issues that need immediate attention.   Please be sure medication list is accurate. If a new problem present, please set up appointment sooner than planned today.  At least 150 minutes of moderate exercise per week, daily brisk walking for 15-30 min is a good exercise option. Healthy diet low in saturated (animal) fats and sweets and consisting of fresh fruits and vegetables, lean meats such as fish and white chicken and whole grains.  These are some of recommendations for screening depending of age and risk factors:  - Vaccines:  Tdap vaccine every 10 years.  Shingles vaccine recommended at age 38, could be given after 36 years of age but not sure about insurance coverage.   Pneumonia vaccines: Pneumovax at 65. Sometimes Pneumovax is giving earlier if history of smoking, lung disease,diabetes,kidney disease among some.  Screening for diabetes at age 50 and every 3 years.  Cervical cancer prevention:  Pap smear starts at 35 years of age and continues periodically until 35 years old in low risk women. Pap smear every 3  years between 78 and 63 years old. Pap smear every 3-5 years between women 30 and older if pap smear negative and HPV screening negative.   -Breast cancer: Mammogram: There is disagreement between experts about when to start screening in low risk asymptomatic female but recent recommendations are to start screening at 18 and not later than 35 years old , every 1-2 years and after 35 yo q 2 years. Screening is recommended until 35 years old but some women can continue screening depending of healthy issues.  Colon cancer screening: Has been recently changed to 35 yo. Insurance may not cover until you are 35 years old. Screening is recommended until 34 years old.  Cholesterol disorder screening at age 90 and every 3 years.  Also recommended:  1. Dental visit- Brush and floss your teeth twice daily; visit your dentist twice a year. 2. Eye doctor- Get an eye exam at least every 2 years. 3. Helmet use- Always wear a helmet when riding a bicycle, motorcycle, rollerblading or skateboarding. 4. Safe sex- If you may be exposed to sexually transmitted infections, use a condom. 5. Seat belts- Seat belts can save your live; always wear one. 6. Smoke/Carbon Monoxide detectors- These detectors need to be installed on the appropriate level of your home. Replace batteries at least once a year. 7. Skin cancer- When out in the sun please cover up and use sunscreen 15 SPF or higher. 8. Violence- If anyone is threatening or hurting you, please tell your healthcare provider.  9. Drink alcohol in moderation- Limit alcohol intake to one drink or less per day. Never drink and drive. 10.  Calcium supplementation 1000 to 1200 mg daily, ideally through your diet.  Vitamin D supplementation 800 units daily.

## 2020-07-29 NOTE — Assessment & Plan Note (Signed)
Hydroxyzine 25 mg to take bid as needed for anxiety started today. If problem is not better or worse , we will need to consider another SSRI.

## 2020-07-29 NOTE — Progress Notes (Signed)
HPI: Judith Moore is a 34 y.o. female, who is here today for her routine physical.  Last CPE: 07/05/19.  Regular exercise 3 or more time per week: She is walking at least 4 miles per week. Following a healthy diet: Yes, she has decreased fat intake,has lost some wt,trying to do so.Dietary changes have also helped with cholelithiasis symptoms.  She lives with her 2 children. She just moved to a new house, closing day today.  Chronic medical problems: Anxiety,hypothyroidism,vit deficiencies,and s/p bariatric procedure among some.  Pap smear: 07/18/20 [facility]  She is on a new OCP's, did not tolerate Mirena. Immunization History  Administered Date(s) Administered  . Tdap 07/29/2020   Hep C screening: 06/30/18 NR.  She has no new concerns today.  She took vit D until 05/2020, she was taking Ergocalciferol 50,000 U weekly. Last 25 OH vit D was 10.06 on 10/30/19. B12 deficiency: She is not on vit B12 supplementation. She also needs a refills for Flonase nasal spray, helping with allergies. Her gyn prescribed her cetirizine last visit.  Hypothyroidism: She is not longer on pharmacologic treatment.  Lab Results  Component Value Date   TSH 0.55 07/05/2019   She stopped Fluoxetine because did not like the way she felt. She was having nightmares. She feels like she is dealing ok with stress. She prefers a prn medication. She was having some depressed like mood until a few weeks ago and for 9 months.  In general it is more anxiety than depression  Review of Systems  Constitutional: Negative for appetite change, fatigue and fever.  HENT: Negative for dental problem, hearing loss, mouth sores and sore throat.   Eyes: Negative for redness and visual disturbance.  Respiratory: Negative for cough, shortness of breath and wheezing.   Cardiovascular: Negative for chest pain and leg swelling.  Gastrointestinal: Negative for abdominal pain, nausea and vomiting.       No changes in  bowel habits.  Endocrine: Negative for cold intolerance, heat intolerance, polydipsia, polyphagia and polyuria.  Genitourinary: Negative for decreased urine volume, dysuria, hematuria, vaginal bleeding and vaginal discharge.  Musculoskeletal: Negative for gait problem and myalgias.  Skin: Negative for color change and rash.  Allergic/Immunologic: Positive for environmental allergies.  Neurological: Negative for syncope, weakness and headaches.  Hematological: Negative for adenopathy. Does not bruise/bleed easily.  Psychiatric/Behavioral: Negative for confusion and sleep disturbance. The patient is not nervous/anxious.   All other systems reviewed and are negative.  Current Outpatient Medications on File Prior to Visit  Medication Sig Dispense Refill  . cetirizine (ZYRTEC ALLERGY) 10 MG tablet Take 1 tablet (10 mg total) by mouth daily. 30 tablet 11  . levonorgestrel-ethinyl estradiol (NORDETTE) 0.15-30 MG-MCG tablet Take 1 tablet by mouth daily. 28 tablet 11   No current facility-administered medications on file prior to visit.   Past Medical History:  Diagnosis Date  . Hyperthyroidism     Past Surgical History:  Procedure Laterality Date  . ADENOIDECTOMY    . CESAREAN SECTION  09/28/2005, 11/28/2007  . LAPAROSCOPIC APPENDECTOMY N/A 01/06/2016   Procedure: APPENDECTOMY LAPAROSCOPIC;  Surgeon: Luretha Murphy, MD;  Location: [facility];  Service: General;  Laterality: N/A;  . stomach banding    . TONSILLECTOMY      Allergies  Allergen Reactions  . Almond (Diagnostic) Hives and Swelling    Almonds   Family History  Problem Relation Age of Onset  . Hypertension Mother   . Thyroid disease Mother  multinodular goiter  . Diabetes Maternal Uncle   . Healthy Brother        x1  . Hypertension Maternal Grandmother   . Hypertension Maternal Grandfather   . Cancer Neg Hx   . Heart disease Neg Hx     Social History   Socioeconomic History  . Marital status: Divorced     Spouse name: SEPARATED  . Number of children: 2  . Years of education: 15+  . Highest education level: Not on file  Occupational History  . Occupation: Dentist: [employer]  Tobacco Use  . Smoking status: Never Smoker  . Smokeless tobacco: Never Used  Vaping Use  . Vaping Use: Never used  Substance and Sexual Activity  . Alcohol use: Yes    Comment: occasional  . Drug use: No  . Sexual activity: Yes    Partners: Male    Birth control/protection: I.U.D.    Comment: Mirena  Other Topics Concern  . Not on file  Social History Narrative   Lives with her two sons, her uncle and cousin. Separated from husband 11/2010.   Social Determinants of Health   Financial Resource Strain:   . Difficulty of Paying Living Expenses: Not on file  Food Insecurity:   . Worried About Programme researcher, broadcasting/film/video in the Last Year: Not on file  . Ran Out of Food in the Last Year: Not on file  Transportation Needs:   . Lack of Transportation (Medical): Not on file  . Lack of Transportation (Non-Medical): Not on file  Physical Activity:   . Days of Exercise per Week: Not on file  . Minutes of Exercise per Session: Not on file  Stress:   . Feeling of Stress : Not on file  Social Connections:   . Frequency of Communication with Friends and Family: Not on file  . Frequency of Social Gatherings with Friends and Family: Not on file  . Attends Religious Services: Not on file  . Active Member of Clubs or Organizations: Not on file  . Attends Banker Meetings: Not on file  . Marital Status: Not on file   Vitals:   07/29/20 0735  BP: 138/88  Pulse: 74  Resp: 12  Temp: 98.4 F (36.9 C)  SpO2: 98%   Body mass index is 27.95 kg/m.  Wt Readings from Last 3 Encounters:  07/29/20 175 lb 12.8 oz (79.7 kg)  07/18/20 178 lb 4.8 oz (80.9 kg)  10/30/19 187 lb (84.8 kg)   Physical Exam Vitals and nursing note reviewed.  Constitutional:      General: She is not in  acute distress.    Appearance: She is well-developed.  HENT:     Head: Normocephalic and atraumatic.     Right Ear: Hearing, tympanic membrane, ear canal and external ear normal.     Left Ear: Hearing, tympanic membrane, ear canal and external ear normal.     Mouth/Throat:     Mouth: Mucous membranes are moist.     Pharynx: Oropharynx is clear. Uvula midline.  Eyes:     Extraocular Movements: Extraocular movements intact.     Conjunctiva/sclera: Conjunctivae normal.     Pupils: Pupils are equal, round, and reactive to light.  Neck:     Thyroid: No thyromegaly.     Trachea: No tracheal deviation.  Cardiovascular:     Rate and Rhythm: Normal rate and regular rhythm.     Pulses:  Dorsalis pedis pulses are 2+ on the right side and 2+ on the left side.     Heart sounds: No murmur heard.   Pulmonary:     Effort: Pulmonary effort is normal. No respiratory distress.     Breath sounds: Normal breath sounds.  Abdominal:     Palpations: Abdomen is soft. There is no hepatomegaly or mass.     Tenderness: There is no abdominal tenderness.  Genitourinary:    Comments: Deferred to gyn. Musculoskeletal:     Comments: No major deformity or signs of synovitis appreciated.  Lymphadenopathy:     Cervical: No cervical adenopathy.     Upper Body:     Right upper body: No supraclavicular adenopathy.     Left upper body: No supraclavicular adenopathy.  Skin:    General: Skin is warm.     Findings: No erythema or rash.  Neurological:     General: No focal deficit present.     Mental Status: She is alert and oriented to person, place, and time.     Cranial Nerves: No cranial nerve deficit.     Coordination: Coordination normal.     Gait: Gait normal.     Deep Tendon Reflexes:     Reflex Scores:      Bicep reflexes are 2+ on the right side and 2+ on the left side.      Patellar reflexes are 2+ on the right side and 2+ on the left side. Psychiatric:        Mood and Affect: Mood and  affect normal.     Comments: Well groomed, good eye contact.   ASSESSMENT AND PLAN:  Judith Moore was here today annual physical examination.  Orders Placed This Encounter  Procedures  . Tdap vaccine greater than or equal to 7yo IM  . VITAMIN D 25 Hydroxy (Vit-D Deficiency, Fractures)  . Vitamin B12  . BASIC METABOLIC PANEL WITH GFR  . Lipid panel  . EXTRA LAV TOP TUBE   Lab Results  Component Value Date   VITAMINB12 503 07/29/2020   Lab Results  Component Value Date   CREATININE 0.89 07/29/2020   BUN 9 07/29/2020   NA 140 07/29/2020   K 4.5 07/29/2020   CL 106 07/29/2020   CO2 25 07/29/2020   Lab Results  Component Value Date   CHOL 173 07/29/2020   HDL 65 07/29/2020   LDLCALC 93 07/29/2020   TRIG 65 07/29/2020   CHOLHDL 2.7 07/29/2020   Routine general medical examination at a health care facility We discussed the importance of regular physical activity and healthy diet for prevention of chronic illness and/or complications. Preventive guidelines reviewed. Vaccination up to date.  Next CPE in a year.  Vitamin B 12 deficiency Further recommendations will be given according to B12 results.  Vitamin D deficiency, unspecified Ergocalciferol 50,000 U q 2 weeks. Dose of Vit D will be adjusted if needed.  -     Vitamin D, Ergocalciferol, (DRISDOL) 1.25 MG (50000 UNIT) CAPS capsule; 1 cap weekly x 8 weeks then q 2 weeks.  Screening for lipoid disorders -     Lipid panel  Screening for endocrine, metabolic and immunity disorder -     BASIC METABOLIC PANEL WITH GFR  Bariatric surgery status -     Vitamin B1, whole blood  Need for Tdap vaccination -     Tdap vaccine greater than or equal to 7yo IM  Anxiety disorder Hydroxyzine 25 mg to take bid  as needed for anxiety started today. If problem is not better or worse , we will need to consider another SSRI.  Hypothyroidism TSH has been in normal range with no medication. Further recommendations  according to TSH results.  Allergic rhinitis Mildly symptomatic. Flonase nasal spray sent to her pharmacy. Nasal saline irrigations as needed may also help.   Return in about 1 year (around 07/29/2021) for CPE, before if needed for anxiety..  Lennis Rader G. [place], MD  [facility]. [place] office.    Today you have you routine preventive visit. A few things to remember from today's visit:   Routine general medical examination at a health care facility  Vitamin B 12 deficiency - Plan: Vitamin B12  Vitamin D deficiency, unspecified - Plan: VITAMIN D 25 Hydroxy (Vit-D Deficiency, Fractures), Vitamin D, Ergocalciferol, (DRISDOL) 1.25 MG (50000 UNIT) CAPS capsule  Hypothyroidism, unspecified type  Allergic rhinitis, unspecified seasonality, unspecified trigger - Plan: fluticasone (FLONASE) 50 MCG/ACT nasal spray  Screening for lipoid disorders - Plan: Lipid panel  Screening for endocrine, metabolic and immunity disorder - Plan: BASIC METABOLIC PANEL WITH GFR  Today we started Hydroxyzine to take as needed for anxiety. If not better we will need daily medication.  If you need refills please call your pharmacy. Do not use My Chart to request refills or for acute issues that need immediate attention.   Please be sure medication list is accurate. If a new problem present, please set up appointment sooner than planned today.  At least 150 minutes of moderate exercise per week, daily brisk walking for 15-30 min is a good exercise option. Healthy diet low in saturated (animal) fats and sweets and consisting of fresh fruits and vegetables, lean meats such as fish and white chicken and whole grains.  These are some of recommendations for screening depending of age and risk factors:  - Vaccines:  Tdap vaccine every 10 years.  Shingles vaccine recommended at age 75, could be given after 35 years of age but not sure about insurance coverage.   Pneumonia vaccines: Pneumovax  at 65. Sometimes Pneumovax is giving earlier if history of smoking, lung disease,diabetes,kidney disease among some.  Screening for diabetes at age 6 and every 3 years.  Cervical cancer prevention:  Pap smear starts at 35 years of age and continues periodically until 34 years old in low risk women. Pap smear every 3 years between 55 and 62 years old. Pap smear every 3-5 years between women 30 and older if pap smear negative and HPV screening negative.   -Breast cancer: Mammogram: There is disagreement between experts about when to start screening in low risk asymptomatic female but recent recommendations are to start screening at 56 and not later than 35 years old , every 1-2 years and after 35 yo q 2 years. Screening is recommended until 35 years old but some women can continue screening depending of healthy issues.  Colon cancer screening: Has been recently changed to 35 yo. Insurance may not cover until you are 35 years old. Screening is recommended until 35 years old.  Cholesterol disorder screening at age 34 and every 3 years.  Also recommended:  1. Dental visit- Brush and floss your teeth twice daily; visit your dentist twice a year. 2. Eye doctor- Get an eye exam at least every 2 years. 3. Helmet use- Always wear a helmet when riding a bicycle, motorcycle, rollerblading or skateboarding. 4. Safe sex- If you may be exposed to sexually transmitted infections, use a condom.  5. Seat belts- Seat belts can save your live; always wear one. 6. Smoke/Carbon Monoxide detectors- These detectors need to be installed on the appropriate level of your home. Replace batteries at least once a year. 7. Skin cancer- When out in the sun please cover up and use sunscreen 15 SPF or higher. 8. Violence- If anyone is threatening or hurting you, please tell your healthcare provider.  9. Drink alcohol in moderation- Limit alcohol intake to one drink or less per day. Never drink and drive. 10. Calcium  supplementation 1000 to 1200 mg daily, ideally through your diet.  Vitamin D supplementation 800 units daily.

## 2020-07-29 NOTE — Assessment & Plan Note (Signed)
TSH has been in normal range with no medication. Further recommendations according to TSH results.

## 2020-07-29 NOTE — Assessment & Plan Note (Signed)
Mildly symptomatic. Flonase nasal spray sent to her pharmacy. Nasal saline irrigations as needed may also help.

## 2020-07-30 LAB — LIPID PANEL
Cholesterol: 173 mg/dL (ref <200)
HDL: 65 mg/dL (ref >=50)
LDL Cholesterol (Calc): 93 mg/dL (calc)
Non-HDL Cholesterol (Calc): 108 mg/dL (calc) (ref <130)
Total CHOL/HDL Ratio: 2.7 (calc) (ref <5.0)
Triglycerides: 65 mg/dL (ref <150)

## 2020-07-30 LAB — BASIC METABOLIC PANEL WITH GFR
BUN: 9 mg/dL (ref 7–25)
CO2: 25 mmol/L (ref 20–32)
Calcium: 9.6 mg/dL (ref 8.6–10.2)
Chloride: 106 mmol/L (ref 98–110)
Creat: 0.89 mg/dL (ref 0.50–1.10)
GFR, Est African American: 97 mL/min/{1.73_m2} (ref >=60)
GFR, Est Non African American: 84 mL/min/{1.73_m2} (ref >=60)
Glucose, Bld: 84 mg/dL (ref 65–99)
Potassium: 4.5 mmol/L (ref 3.5–5.3)
Sodium: 140 mmol/L (ref 135–146)

## 2020-07-30 LAB — VITAMIN D 25 HYDROXY (VIT D DEFICIENCY, FRACTURES): Vit D, 25-Hydroxy: 38 ng/mL (ref 30–100)

## 2020-07-30 LAB — VITAMIN B12: Vitamin B-12: 503 pg/mL (ref 200–1100)

## 2020-07-30 LAB — EXTRA LAV TOP TUBE

## 2020-08-03 LAB — VITAMIN B1, WHOLE BLOOD: Vitamin B1 (Thiamine), Blood: 80 nmol/L (ref 78–185)

## 2020-08-12 ENCOUNTER — Other Ambulatory Visit (HOSPITAL_COMMUNITY)
Admission: RE | Admit: 2020-08-12 | Discharge: 2020-08-12 | Disposition: A | Discharge status: Home / Self Care | Payer: No Typology Code available for payment source | Source: Ambulatory Visit | Attending: Obstetrics | Admitting: Obstetrics

## 2020-08-12 ENCOUNTER — Encounter: Payer: Self-pay | Admitting: Obstetrics

## 2020-08-12 ENCOUNTER — Other Ambulatory Visit: Payer: Self-pay

## 2020-08-12 ENCOUNTER — Ambulatory Visit: Payer: No Typology Code available for payment source | Admitting: Obstetrics

## 2020-08-12 VITALS — BP 133/83 | HR 65

## 2020-08-12 DIAGNOSIS — N760 Acute vaginitis: Secondary | ICD-10-CM | POA: Insufficient documentation

## 2020-08-12 DIAGNOSIS — B3731 Acute candidiasis of vulva and vagina: Secondary | ICD-10-CM

## 2020-08-12 DIAGNOSIS — Z113 Encounter for screening for infections with a predominantly sexual mode of transmission: Secondary | ICD-10-CM

## 2020-08-12 DIAGNOSIS — N898 Other specified noninflammatory disorders of vagina: Secondary | ICD-10-CM | POA: Insufficient documentation

## 2020-08-12 DIAGNOSIS — B9689 Other specified bacterial agents as the cause of diseases classified elsewhere: Secondary | ICD-10-CM | POA: Insufficient documentation

## 2020-08-12 DIAGNOSIS — B373 Candidiasis of vulva and vagina: Secondary | ICD-10-CM

## 2020-08-12 MED ORDER — FLUCONAZOLE 150 MG PO TABS: 150.0000 mg | ORAL_TABLET | Freq: Once | ORAL | 2 refills | Status: AC

## 2020-08-12 MED ORDER — METRONIDAZOLE 500 MG PO TABS: 500.0000 mg | ORAL_TABLET | Freq: Two times a day (BID) | ORAL | 5 refills | Status: DC

## 2020-08-12 NOTE — Progress Notes (Signed)
34 y.o. presents for vaginal, odor, itching x 2 days.  Denies discharge, pain, fever, chills

## 2020-08-12 NOTE — Progress Notes (Signed)
Patient ID: Judith Moore, female   DOB: [birth year], 34 y.o.   MRN: [id number]  Chief Complaint  Patient presents with  . Vaginal Odor    HPI Judith Moore is a 34 y.o. female.  Complains of malodorous vaginal discharge with vaginal itching. HPI  Past Medical History:  Diagnosis Date  . Hyperthyroidism     Past Surgical History:  Procedure Laterality Date  . ADENOIDECTOMY    . CESAREAN SECTION  09/28/2005, 11/28/2007  . LAPAROSCOPIC APPENDECTOMY N/A 01/06/2016   Procedure: APPENDECTOMY LAPAROSCOPIC;  Surgeon: Luretha Murphy, MD;  Location: [facility];  Service: General;  Laterality: N/A;  . stomach banding    . TONSILLECTOMY      Family History  Problem Relation Age of Onset  . Hypertension Mother   . Thyroid disease Mother        multinodular goiter  . Diabetes Maternal Uncle   . Healthy Brother        x1  . Hypertension Maternal Grandmother   . Hypertension Maternal Grandfather   . Cancer Neg Hx   . Heart disease Neg Hx     Social History Social History   Tobacco Use  . Smoking status: Never Smoker  . Smokeless tobacco: Never Used  Vaping Use  . Vaping Use: Never used  Substance Use Topics  . Alcohol use: Yes    Comment: occasional  . Drug use: No    Allergies  Allergen Reactions  . Almond (Diagnostic) Hives and Swelling    Almonds    Current Outpatient Medications  Medication Sig Dispense Refill  . cetirizine (ZYRTEC ALLERGY) 10 MG tablet Take 1 tablet (10 mg total) by mouth daily. 30 tablet 11  . fluticasone (FLONASE) 50 MCG/ACT nasal spray Place 1 spray into both nostrils daily. 16 g PRN  . hydrOXYzine (ATARAX/VISTARIL) 25 MG tablet Take 1 tablet (25 mg total) by mouth 3 (three) times daily as needed for anxiety. 30 tablet 1  . levonorgestrel-ethinyl estradiol (NORDETTE) 0.15-30 MG-MCG tablet Take 1 tablet by mouth daily. 28 tablet 11  . Vitamin D, Ergocalciferol, (DRISDOL) 1.25 MG (50000 UNIT) CAPS capsule 1 cap weekly x 8 weeks then q 2 weeks.  12 capsule 1   No current facility-administered medications for this visit.    Review of Systems Review of Systems Constitutional: negative for fatigue and weight loss Respiratory: negative for cough and wheezing Cardiovascular: negative for chest pain, fatigue and palpitations Gastrointestinal: negative for abdominal pain and change in bowel habits Genitourinary: positive for malodorous vaginal discharge and itching Integument/breast: negative for nipple discharge Musculoskeletal:negative for myalgias Neurological: negative for gait problems and tremors Behavioral/Psych: negative for abusive relationship, depression Endocrine: negative for temperature intolerance      Blood pressure 133/83, pulse 65.  Physical Exam Physical Exam           General: Alert and no distress Abdomen:  normal findings: no organomegaly, soft, non-tender and no hernia  Pelvis:  External genitalia: normal general appearance Urinary system: urethral meatus normal and bladder without fullness, nontender Vaginal: normal without tenderness, induration or masses Cervix: normal appearance Adnexa: normal bimanual exam Uterus: anteverted and non-tender, normal size    50% of 15 min visit spent on counseling and coordination of care.   Data Reviewed Wet Prep Cultures  Assessment     1. Vaginal discharge Rx: - Cervicovaginal ancillary only( [place])  2. Vaginal itching  3. BV (bacterial vaginosis) Rx: - metroNIDAZOLE (FLAGYL) 500 MG tablet; Take 1 tablet (500 mg  total) by mouth 2 (two) times daily.  Dispense: 14 tablet; Refill: 5  4. Candida vaginitis Rx: - fluconazole (DIFLUCAN) 150 MG tablet; Take 1 tablet (150 mg total) by mouth once for 1 dose.  Dispense: 1 tablet; Refill: 2  5. Screen for STD (sexually transmitted disease) Rx: - Hepatitis C Antibody - Hepatitis B surface antigen - RPR - HIV antibody (with reflex)    Plan  Follow up in 6 months   Orders Placed This Encounter   Procedures  . Hepatitis C Antibody  . Hepatitis B surface antigen  . RPR  . HIV antibody (with reflex)      Brock Bad, MD 08/12/2020 1:55 PM

## 2020-08-13 ENCOUNTER — Other Ambulatory Visit: Payer: Self-pay | Admitting: Obstetrics

## 2020-08-13 LAB — HEPATITIS B SURFACE ANTIGEN: Hepatitis B Surface Ag: NEGATIVE

## 2020-08-13 LAB — CERVICOVAGINAL ANCILLARY ONLY
Bacterial Vaginitis (gardnerella): POSITIVE — AB
Candida Glabrata: NEGATIVE
Candida Vaginitis: NEGATIVE
Chlamydia: NEGATIVE
Comment: NEGATIVE
Comment: NEGATIVE
Comment: NEGATIVE
Comment: NEGATIVE
Comment: NEGATIVE
Comment: NORMAL
Neisseria Gonorrhea: NEGATIVE
Trichomonas: NEGATIVE

## 2020-08-13 LAB — HIV ANTIBODY (ROUTINE TESTING W REFLEX): HIV Screen 4th Generation wRfx: NONREACTIVE

## 2020-08-13 LAB — HEPATITIS C ANTIBODY: Hep C Virus Ab: <0.1 s/co ratio (ref 0.0–0.9)

## 2020-08-13 LAB — RPR: RPR Ser Ql: NONREACTIVE

## 2020-08-26 ENCOUNTER — Other Ambulatory Visit: Payer: Self-pay | Admitting: Obstetrics

## 2020-09-12 ENCOUNTER — Encounter: Payer: Self-pay | Admitting: Family Medicine

## 2020-09-12 ENCOUNTER — Telehealth (INDEPENDENT_AMBULATORY_CARE_PROVIDER_SITE_OTHER): Payer: No Typology Code available for payment source | Admitting: Family Medicine

## 2020-09-12 VITALS — Temp 98.7°F | Wt 178.0 lb

## 2020-09-12 DIAGNOSIS — R0981 Nasal congestion: Secondary | ICD-10-CM

## 2020-09-12 DIAGNOSIS — R110 Nausea: Secondary | ICD-10-CM

## 2020-09-12 DIAGNOSIS — R197 Diarrhea, unspecified: Secondary | ICD-10-CM | POA: Diagnosis not present

## 2020-09-12 NOTE — Progress Notes (Signed)
Virtual Visit via Video Note  I connected with Jaia  on 09/12/20 at  4:20 PM EST by a video enabled telemedicine application and verified that I am speaking with the correct person using two identifiers.  Location patient: home, [place] Location provider:work or home office Persons participating in the virtual visit: patient, provider  I discussed the limitations of evaluation and management by telemedicine and the availability of in person appointments. The patient expressed understanding and agreed to proceed.   HPI:  Acute telemedicine visit for sinus issues: -Onset: 5 days ago -Symptoms include: started with a sore throat, then felt sick with sweating, nausea, diarrhea, dry heaving, dizziness during the GI symptoms, then nasal congestion, HA - now better, neck soreness, sneezing, chest congestion, cough -reports is 80% better today with most symptoms resolved -Denies: NVD today, ha today, neck pain today - doing much better, SOB, CP,  inability to get out of bed/eat/drink -mom was sick recently with similar symptoms -Has tried: none -Pertinent past medical history:see below -Pertinent medication allergies:nkda -COVID-19 vaccine status: not vaccinated for flu or covid  ROS: See pertinent positives and negatives per HPI.  Past Medical History:  Diagnosis Date  . Hyperthyroidism     Past Surgical History:  Procedure Laterality Date  . ADENOIDECTOMY    . CESAREAN SECTION  09/28/2005, 11/28/2007  . LAPAROSCOPIC APPENDECTOMY N/A 01/06/2016   Procedure: APPENDECTOMY LAPAROSCOPIC;  Surgeon: Luretha Murphy, MD;  Location: [facility];  Service: General;  Laterality: N/A;  . stomach banding    . TONSILLECTOMY       Current Outpatient Medications:  .  BLISOVI 24 FE 1-20 MG-MCG(24) tablet, TAKE 1 TABLET BY MOUTH EVERY DAY, Disp: 84 tablet, Rfl: 2 .  cetirizine (ZYRTEC ALLERGY) 10 MG tablet, Take 1 tablet (10 mg total) by mouth daily., Disp: 30 tablet, Rfl: 11 .  fluticasone (FLONASE) 50  MCG/ACT nasal spray, Place 1 spray into both nostrils daily., Disp: 16 g, Rfl: PRN .  hydrOXYzine (ATARAX/VISTARIL) 25 MG tablet, Take 1 tablet (25 mg total) by mouth 3 (three) times daily as needed for anxiety., Disp: 30 tablet, Rfl: 1 .  levonorgestrel-ethinyl estradiol (NORDETTE) 0.15-30 MG-MCG tablet, Take 1 tablet by mouth daily., Disp: 28 tablet, Rfl: 11 .  metroNIDAZOLE (FLAGYL) 500 MG tablet, Take 1 tablet (500 mg total) by mouth 2 (two) times daily., Disp: 14 tablet, Rfl: 5  EXAM:  VITALS per patient if applicable:  GENERAL: alert, oriented, appears well and in no acute distress  HEENT: atraumatic, conjunttiva clear, no obvious abnormalities on inspection of external nose and ears  NECK: normal movements of the head and neck  LUNGS: on inspection no signs of respiratory distress, breathing rate appears normal, no obvious gross SOB, gasping or wheezing  CV: no obvious cyanosis  MS: moves all visible extremities without noticeable abnormality  PSYCH/NEURO: pleasant and cooperative, no obvious depression or anxiety, speech and thought processing grossly intact  ASSESSMENT AND PLAN:  Discussed the following assessment and plan:  Nasal congestion  Nausea  Diarrhea, unspecified type  -we discussed possible serious and likely etiologies, options for evaluation and workup, limitations of telemedicine visit vs in person visit, treatment, treatment risks and precautions. Pt prefers to treat via telemedicine empirically rather than in person at this moment.  Query influenza, COVID-19 versus other.  It seems that most of her symptoms resolved today and that she is doing much better.  Opted for symptomatic care with fluids, Tylenol or Aleve if needed, Imodium if needed.  Also  recommended getting a Covid test and discussed options for testing, treatment, potential complications and precautions.  At this point treatment for flu would not likely be beneficial given duration of symptoms.   Advised staying home until symptoms have resolved for 24 hours or for full 10 days from onset of symptoms if Covid test positive. Work/School slipped offered: provided in patient instructions  Scheduled follow up with PCP offered: Agrees to follow-up if needed Advised to seek prompt in person care if worsening, new symptoms arise, or if is not improving with treatment. Discussed options for inperson care if PCP office not available. Did let this patient know that I only do telemedicine on Tuesdays and Thursdays for [place]. Advised to schedule follow up visit with PCP or [facility] if any further questions or concerns to avoid delays in care.   I discussed the assessment and treatment plan with the patient. The patient was provided an opportunity to ask questions and all were answered. The patient agreed with the plan and demonstrated an understanding of the instructions.     Terressa Koyanagi, DO

## 2020-09-12 NOTE — Patient Instructions (Addendum)
   ---------------------------------------------------------------------------------------------------------------------------      WORK SLIP:  Patient Judith Moore,  April 09, 1986, was seen for a medical visit today, 09/12/20 . Please excuse from work according to the [facility] guidelines for a COVID like illness. We advise 10 days minimum from the onset of symptoms (09/07/20) PLUS 1 day of no fever and improved symptoms. Will defer to employer for a sooner return to work if COVID19 testing is negative and the symptoms have resolved. Advise following CDC guidelines.    Sincerely: E-signature: Dr. Kriste Basque, DO [facility] Ph: [phone]   ------------------------------------------------------------------------------------------------------------------------------    HOME CARE TIPS:  Dolores Lory COVID19 testing information: [email or web address] [place] [phone] Most pharmacies also offer testing and home test kits.  -can use imodium if any further diarrhea  -can use tylenol or aleve if needed for fevers, aches and pains per instructions  -can use nasal saline a few times per day if nasal congestion, sometime a short course of Afrin nasal spray for 3 days can help as well  -stay hydrated, drink plenty of fluids and eat small healthy meals - avoid dairy  -can take 1000 IU Vit D3 and Vit C lozenges per instructions  -follow up with your doctor in 2-3 days unless improving and feeling better  -stay home while sick, except to seek medical care, and if you have COVID19 please stay home for a full 10 days since the onset of symptoms PLUS one day of no fever and feeling better.  It was nice to meet you today, and I really hope you are feeling better soon. I help [facility] out with telemedicine visits on Tuesdays and Thursdays and am available for visits on those days. If you have any concerns or questions following this visit  please schedule a follow up visit with your Primary Care doctor or seek care at a local urgent care clinic to avoid delays in care.    Seek in person care promptly if your symptoms worsen, new concerns arise or you are not improving with treatment. Call 911 and/or seek emergency care if you symptoms are severe or life threatening.

## 2020-09-13 ENCOUNTER — Ambulatory Visit: Payer: No Typology Code available for payment source | Admitting: Family Medicine

## 2020-10-30 ENCOUNTER — Telehealth (INDEPENDENT_AMBULATORY_CARE_PROVIDER_SITE_OTHER): Payer: No Typology Code available for payment source | Admitting: Family Medicine

## 2020-10-30 ENCOUNTER — Ambulatory Visit: Payer: No Typology Code available for payment source | Admitting: Family Medicine

## 2020-10-31 NOTE — Progress Notes (Signed)
Pt was a no show

## 2020-12-06 ENCOUNTER — Encounter (HOSPITAL_COMMUNITY): Payer: Self-pay | Admitting: Pharmacy Technician

## 2020-12-06 ENCOUNTER — Emergency Department (HOSPITAL_COMMUNITY): Payer: No Typology Code available for payment source

## 2020-12-06 ENCOUNTER — Emergency Department (HOSPITAL_COMMUNITY)
Admission: EM | Admit: 2020-12-06 | Discharge: 2020-12-06 | Disposition: A | Discharge status: Home / Self Care | Payer: No Typology Code available for payment source | Attending: Emergency Medicine | Admitting: Emergency Medicine

## 2020-12-06 DIAGNOSIS — R0602 Shortness of breath: Secondary | ICD-10-CM | POA: Insufficient documentation

## 2020-12-06 DIAGNOSIS — R42 Dizziness and giddiness: Secondary | ICD-10-CM | POA: Diagnosis not present

## 2020-12-06 DIAGNOSIS — E039 Hypothyroidism, unspecified: Secondary | ICD-10-CM | POA: Diagnosis not present

## 2020-12-06 DIAGNOSIS — R110 Nausea: Secondary | ICD-10-CM | POA: Diagnosis not present

## 2020-12-06 DIAGNOSIS — K219 Gastro-esophageal reflux disease without esophagitis: Secondary | ICD-10-CM | POA: Diagnosis not present

## 2020-12-06 DIAGNOSIS — R1013 Epigastric pain: Secondary | ICD-10-CM | POA: Insufficient documentation

## 2020-12-06 DIAGNOSIS — M545 Low back pain, unspecified: Secondary | ICD-10-CM | POA: Diagnosis not present

## 2020-12-06 DIAGNOSIS — Z9049 Acquired absence of other specified parts of digestive tract: Secondary | ICD-10-CM | POA: Diagnosis not present

## 2020-12-06 LAB — I-STAT BETA HCG BLOOD, ED (MC, WL, AP ONLY): I-stat hCG, quantitative: <5 m[IU]/mL (ref <5)

## 2020-12-06 LAB — URINALYSIS, ROUTINE W REFLEX MICROSCOPIC
Bilirubin Urine: NEGATIVE
Glucose, UA: NEGATIVE mg/dL
Hgb urine dipstick: NEGATIVE
Ketones, ur: 20 mg/dL — AB
Leukocytes,Ua: NEGATIVE
Nitrite: NEGATIVE
Protein, ur: NEGATIVE mg/dL
Specific Gravity, Urine: 1.028 (ref 1.005–1.030)
pH: 5 (ref 5.0–8.0)

## 2020-12-06 LAB — COMPREHENSIVE METABOLIC PANEL
ALT: 38 U/L (ref 0–44)
AST: 41 U/L (ref 15–41)
Albumin: 3.5 g/dL (ref 3.5–5.0)
Alkaline Phosphatase: 42 U/L (ref 38–126)
Anion gap: 9 (ref 5–15)
BUN: 8 mg/dL (ref 6–20)
CO2: 22 mmol/L (ref 22–32)
Calcium: 8.9 mg/dL (ref 8.9–10.3)
Chloride: 107 mmol/L (ref 98–111)
Creatinine, Ser: 1.04 mg/dL — ABNORMAL HIGH (ref 0.44–1.00)
GFR, Estimated: >60 mL/min (ref >60)
Glucose, Bld: 109 mg/dL — ABNORMAL HIGH (ref 70–99)
Potassium: 3.6 mmol/L (ref 3.5–5.1)
Sodium: 138 mmol/L (ref 135–145)
Total Bilirubin: 0.6 mg/dL (ref 0.3–1.2)
Total Protein: 7.1 g/dL (ref 6.5–8.1)

## 2020-12-06 LAB — CBC
HCT: 36.9 % (ref 36.0–46.0)
Hemoglobin: 11.5 g/dL — ABNORMAL LOW (ref 12.0–15.0)
MCH: 26.7 pg (ref 26.0–34.0)
MCHC: 31.2 g/dL (ref 30.0–36.0)
MCV: 85.8 fL (ref 80.0–100.0)
Platelets: 283 10*3/uL (ref 150–400)
RBC: 4.3 MIL/uL (ref 3.87–5.11)
RDW: 14.1 % (ref 11.5–15.5)
WBC: 5.4 10*3/uL (ref 4.0–10.5)
nRBC: 0 % (ref 0.0–0.2)

## 2020-12-06 LAB — LIPASE, BLOOD: Lipase: 27 U/L (ref 11–51)

## 2020-12-06 MED ORDER — ALUM & MAG HYDROXIDE-SIMETH 200-200-20 MG/5ML PO SUSP
30.0000 mL | Freq: Once | ORAL | Status: AC
  Administered 2020-12-06: 30 mL via ORAL
  Filled 2020-12-06: qty 30

## 2020-12-06 MED ORDER — ALUM & MAG HYDROXIDE-SIMETH 200-200-20 MG/5ML PO SUSP: 15.0000 mL | Freq: Four times a day (QID) | ORAL | 0 refills | Status: DC | PRN

## 2020-12-06 MED ORDER — FAMOTIDINE IN NACL 20-0.9 MG/50ML-% IV SOLN
20.0000 mg | Freq: Once | INTRAVENOUS | Status: AC
  Administered 2020-12-06: 20 mg via INTRAVENOUS
  Filled 2020-12-06: qty 50

## 2020-12-06 MED ORDER — IOHEXOL 350 MG/ML SOLN
100.0000 mL | Freq: Once | INTRAVENOUS | Status: AC | PRN
  Administered 2020-12-06: 100 mL via INTRAVENOUS

## 2020-12-06 NOTE — Discharge Instructions (Addendum)
Came to the emergency department today to be evaluated for your epigastric abdominal pain.  Your lab work and physical exam were reassuring.  CT scan of your chest abdomen and pelvis showed no signs of abdominal aortic dissection or aneurysm.  It did show that you had stones in your gallbladder.  Please take your prescribed medication for acid reflux.  I have also given you a prescription for Mylanta which you may use.  These follow-up with your primary care provider and the gastroenterologist I have given you contact information for.  Get help right away if: Your pain does not go away as soon as your health care provider told you to expect. You cannot stop vomiting. Your pain is only in areas of the abdomen, such as the right side or the left lower portion of the abdomen. Pain on the right side could be caused by appendicitis. You have bloody or black stools, or stools that look like tar. You have severe pain, cramping, or bloating in your abdomen. You have signs of dehydration, such as: Dark urine, very little urine, or no urine. Cracked lips. Dry mouth. Sunken eyes. Sleepiness. Weakness. You have trouble breathing or chest pain.

## 2020-12-06 NOTE — ED Triage Notes (Signed)
Pt bib ems from home with reports of abdominal pain (epigastric) intermittent in nature. Pt initial BP 212/120. On arrival BP 142/90. Pt has known gall stones with hx gastric sleeve sx approx 5 years ago. Pt also endorses frequent constipation. Given 4mg  zofran en route.

## 2020-12-07 NOTE — ED Provider Notes (Signed)
[facility] EMERGENCY DEPARTMENT Provider Note   CSN: [id number] Arrival date & time: 12/06/20  1311     History Chief Complaint  Patient presents with  . Abdominal Pain    Judith Moore is a 35 y.o. female with a history of hypothyroidism, hyperlipidemia, anxiety disorder, status post appendectomy, status post stomach banding.  Patient presents with a chief complaint of epigastric pain.  Patient reports that her pain began at 1230.  Pain came on suddenly while she was at rest.  Patient reports pain was 10/10 causing her to double over.  Patient denies any radiation of her pain but also reports that she felt simultaneous pain in her lower back.  Pain was worse when she attempted to move.  Patient endorsed associated shortness of breath, lightheadedness, and nausea.  She states that the pain was 10/10 for 45 minutes and then gradually eased off.  At present patient reports pain 4/10.  She denies any chest pain or focal neurological deficits.  Reports that she has had general abdominal discomfort and bloating x1 week.  Last bowel movement this morning.    Patient reports that she ate 2 hours prior to onset of pain.  Reports that she is prescribed medication for GERD however she does not take it.  Patient reports that she has known gallstones.  She denies regular alcohol use.  Patient reports that last month she had increased NSAID use however tried to take on a full stomach.  Patient denies any recent NSAID use.  HPI     Past Medical History:  Diagnosis Date  . Hyperthyroidism     Patient Active Problem List   Diagnosis Date Noted  . Hypothyroidism 07/05/2019  . Allergic rhinitis 07/09/2017  . 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 Anxiety disorder 05/21/2017  . Hyperthyroidism 09/23/2015  . Bariatric surgery status 08/22/2015  . Gallstones 01/14/2015  . Gastroesophageal reflux disease without esophagitis 01/14/2015  . Hyperlipidemia 01/14/2015  .  Midline low back pain without sciatica 01/14/2015  . Morbid obesity due to excess calories (Time) 01/14/2015    Past Surgical History:  Procedure Laterality Date  . ADENOIDECTOMY    . CESAREAN SECTION  09/28/2005, 11/28/2007  . LAPAROSCOPIC APPENDECTOMY N/A 01/06/2016   Procedure: APPENDECTOMY LAPAROSCOPIC;  Surgeon: Johnathan Hausen, MD;  Location: [facility] ORS;  Service: General;  Laterality: N/A;  . stomach banding    . TONSILLECTOMY       OB History    Gravida  2   Para  2   Term  1   Preterm  1   AB  0   Living  2     SAB  0   IAB  0   Ectopic  0   Multiple  0   Live Births  2           Family History  Problem Relation Age of Onset  . Hypertension Mother   . Thyroid disease Mother        multinodular goiter  . Diabetes Maternal Uncle   . Healthy Brother        x1  . Hypertension Maternal Grandmother   . Hypertension Maternal Grandfather   . Cancer Neg Hx   . Heart disease Neg Hx     Social History   Tobacco Use  . Smoking status: Never Smoker  . Smokeless tobacco: Never Used  Vaping Use  . Vaping Use: Never used  Substance Use Topics  . Alcohol use: Yes  Comment: occasional  . Drug use: No    Home Medications Prior to Admission medications   Medication Sig Start Date End Date Taking? Authorizing Provider  alum & mag hydroxide-simeth (MYLANTA) 200-200-20 MG/5ML suspension Take 15 mLs by mouth every 6 (six) hours as needed for indigestion or heartburn. 12/06/20  Yes Badalamente, Peter R, PA-C  BLISOVI 24 FE 1-20 MG-MCG(24) tablet TAKE 1 TABLET BY MOUTH EVERY DAY 08/26/20   Harper, Charles A, MD  cetirizine (ZYRTEC ALLERGY) 10 MG tablet Take 1 tablet (10 mg total) by mouth daily. 07/18/20   Harper, Charles A, MD  fluticasone (FLONASE) 50 MCG/ACT nasal spray Place 1 spray into both nostrils daily. 07/29/20   Jordan, Betty G, MD  hydrOXYzine (ATARAX/VISTARIL) 25 MG tablet Take 1 tablet (25 mg total) by mouth 3 (three) times daily as needed for  anxiety. 07/29/20   Jordan, Betty G, MD  levonorgestrel-ethinyl estradiol (NORDETTE) 0.15-30 MG-MCG tablet Take 1 tablet by mouth daily. 07/18/20   Harper, Charles A, MD  metroNIDAZOLE (FLAGYL) 500 MG tablet Take 1 tablet (500 mg total) by mouth 2 (two) times daily. 08/12/20   Harper, Charles A, MD    Allergies    Almond (diagnostic)  Review of Systems   Review of Systems  Constitutional: Negative for chills and fever.  Eyes: Negative for visual disturbance.  Respiratory: Positive for shortness of breath.   Cardiovascular: Negative for chest pain.  Gastrointestinal: Positive for abdominal pain and nausea. Negative for abdominal distention, anal bleeding, blood in stool, constipation, diarrhea, rectal pain and vomiting.  Genitourinary: Negative for difficulty urinating, dysuria, frequency and hematuria.  Musculoskeletal: Positive for back pain. Negative for neck pain.  Skin: Negative for color change and rash.  Neurological: Positive for light-headedness. Negative for dizziness, syncope and headaches.  Psychiatric/Behavioral: Negative for confusion.    Physical Exam Updated Vital Signs BP (!) 143/89 (BP Location: Left Arm)   Pulse 65   Temp 98.1 F (36.7 C) (Temporal)   Resp 18   SpO2 100%   Physical Exam Vitals and nursing note reviewed.  Constitutional:      General: She is not in acute distress.    Appearance: She is not ill-appearing, toxic-appearing or diaphoretic.  HENT:     Head: Normocephalic.  Eyes:     General: No scleral icterus.       Right eye: No discharge.        Left eye: No discharge.  Cardiovascular:     Rate and Rhythm: Normal rate.     Pulses:          Carotid pulses are 3+ on the right side and 3+ on the left side.      Radial pulses are 3+ on the right side and 3+ on the left side.       Femoral pulses are 3+ on the right side and 3+ on the left side.    Heart sounds: Normal heart sounds.  Pulmonary:     Effort: Pulmonary effort is normal. No  tachypnea, bradypnea or respiratory distress.     Breath sounds: Normal breath sounds. No stridor. No decreased breath sounds, wheezing, rhonchi or rales.  Abdominal:     General: Bowel sounds are normal. There is no distension. There are no signs of injury.     Palpations: Abdomen is soft. There is no mass or pulsatile mass.     Tenderness: There is abdominal tenderness in the epigastric area. There is no guarding or rebound.       Hernia: There is no hernia in the umbilical area or ventral area.  Musculoskeletal:     Cervical back: Normal range of motion and neck supple.     Right lower leg: No swelling, deformity, lacerations, tenderness or bony tenderness. No edema.     Left lower leg: No swelling, deformity, lacerations, tenderness or bony tenderness. No edema.  Skin:    General: Skin is warm and dry.     Coloration: Skin is not jaundiced or pale.     Findings: No erythema.  Neurological:     General: No focal deficit present.     Mental Status: She is alert.  Psychiatric:        Behavior: Behavior is cooperative.     ED Results / Procedures / Treatments   Labs (all labs ordered are listed, but only abnormal results are displayed) Labs Reviewed  COMPREHENSIVE METABOLIC PANEL - Abnormal; Notable for the following components:      Result Value   Glucose, Bld 109 (*)    Creatinine, Ser 1.04 (*)    All other components within normal limits  CBC - Abnormal; Notable for the following components:   Hemoglobin 11.5 (*)    All other components within normal limits  URINALYSIS, ROUTINE W REFLEX MICROSCOPIC - Abnormal; Notable for the following components:   Ketones, ur 20 (*)    All other components within normal limits  LIPASE, BLOOD  I-STAT BETA HCG BLOOD, ED ([facility], [facility], [facility] ONLY)    EKG None  Radiology CT Angio Chest/Abd/Pel for Dissection W and/or Wo Contrast  Result Date: 12/06/2020 CLINICAL DATA:  Abdominal pain. EXAM: CT ANGIOGRAPHY CHEST, ABDOMEN AND PELVIS TECHNIQUE:  Non-contrast CT of the chest was initially obtained. Multidetector CT imaging through the chest, abdomen and pelvis was performed using the standard protocol during bolus administration of intravenous contrast. Multiplanar reconstructed images and MIPs were obtained and reviewed to evaluate the vascular anatomy. CONTRAST:  100mL OMNIPAQUE IOHEXOL 350 MG/ML SOLN COMPARISON:  January 06, 2016. FINDINGS: CTA CHEST FINDINGS Cardiovascular: Satisfactory opacification of the pulmonary arteries to the segmental level. No evidence of pulmonary embolism. Normal heart size. No pericardial effusion. Mediastinum/Nodes: No enlarged mediastinal, hilar, or axillary lymph nodes. Thyroid gland, trachea, and esophagus demonstrate no significant findings. Lungs/Pleura: Lungs are clear. No pleural effusion or pneumothorax. Musculoskeletal: No chest wall abnormality. No acute or significant osseous findings. Review of the MIP images confirms the above findings. CTA ABDOMEN AND PELVIS FINDINGS VASCULAR Aorta: Normal caliber aorta without aneurysm, dissection, vasculitis or significant stenosis. Celiac: Patent without evidence of aneurysm, dissection, vasculitis or significant stenosis. SMA: Patent without evidence of aneurysm, dissection, vasculitis or significant stenosis. Renals: Both renal arteries are patent without evidence of aneurysm, dissection, vasculitis, fibromuscular dysplasia or significant stenosis. IMA: Patent without evidence of aneurysm, dissection, vasculitis or significant stenosis. Inflow: Patent without evidence of aneurysm, dissection, vasculitis or significant stenosis. Veins: No obvious venous abnormality within the limitations of this arterial phase study. Review of the MIP images confirms the above findings. NON-VASCULAR Hepatobiliary: Gallstone is noted. No biliary dilatation is noted. The liver is unremarkable. Pancreas: Unremarkable. No pancreatic ductal dilatation or surrounding inflammatory changes. Spleen:  Normal in size without focal abnormality. Adrenals/Urinary Tract: Adrenal glands are unremarkable. Kidneys are normal, without renal calculi, focal lesion, or hydronephrosis. Bladder is unremarkable. Stomach/Bowel: Status post gastric bypass. No evidence of bowel obstruction or inflammation. Status post appendectomy. Lymphatic: No adenopathy is noted. Reproductive: Uterus and bilateral adnexa are unremarkable. Other: No abdominal wall hernia or   abnormality. No abdominopelvic ascites. Musculoskeletal: No acute or significant osseous findings. Review of the MIP images confirms the above findings. IMPRESSION: 1. No definite evidence of pulmonary embolus. 2. No evidence of thoracic or abdominal aortic dissection or aneurysm. 3. Cholelithiasis. 4. Status post gastric bypass and appendectomy. 5. No other abnormality seen in the chest, abdomen or pelvis. Electronically Signed   By: Marijo Conception M.D.   On: 12/06/2020 18:54    Procedures Procedures   Medications Ordered in ED Medications  alum & mag hydroxide-simeth (MAALOX/MYLANTA) 200-200-20 MG/5ML suspension 30 mL (30 mLs Oral Given 12/06/20 1838)  famotidine (PEPCID) IVPB 20 mg premix (0 mg Intravenous Stopped 12/06/20 2007)  iohexol (OMNIPAQUE) 350 MG/ML injection 100 mL (100 mLs Intravenous Contrast Given 12/06/20 1821)    ED Course  I have reviewed the triage vital signs and the nursing notes.  Pertinent labs & imaging results that were available during my care of the patient were reviewed by me and considered in my medical decision making (see chart for details).    MDM Rules/Calculators/A&P                          Alert 35 year old female no acute distress, nontoxic appearing.  Presents with chief complaint of sudden onset severe epigastric pain.  Denies any radiation of pain but also reported simultaneous lower back pain.  Patient also endorsed shortness of breath, lightheadedness, and nausea with initial symptom onset.  Due to patient's  sudden onset severe epigastric pain and associated symptoms for aortic dissection.  Due to this section study was ordered.  Imaging showed no definitive evidence of PE, thoracic or abdominal aortic dissection or aneurysm.  Imaging did show cholelithiasis, status post gastric bypass and appendectomy.  Low suspicion for cholangitis or biliary colic as cause of patient's symptoms as AST, ALT, alk phos, total bili all within normal limits; negative Murphy sign, no pain with eating.  Low suspicion for acute pancreatitis as lipase is within normal limits.  No concern for intrauterine pregnancy or ectopic pregnancy as beta-hCG negative.  UA shows no sign of infection.   Patient was given GI cocktail, and Pepcid.  Patient reports improvement in her pain.  On repeat examination patient's abdomen is soft, nondistended, nontender.    Return onset patient symptoms are due to peptic ulcer disease.  Patient advised to start taking her prescribed medication for GERD.  She was given prescription for Mylanta.  Patient given information to follow-up with GI specialist.  Patient to follow-up with her primary care specialist.  Discussed results, findings, treatment and follow up. Patient advised of return precautions. Patient verbalized understanding and agreed with plan.  Patient case and care were discussed with Dr. Dina Rich.  Final Clinical Impression(s) / ED Diagnoses Final diagnoses:  Epigastric abdominal pain    Rx / DC Orders ED Discharge Orders         Ordered    alum & mag hydroxide-simeth (MYLANTA) 200-200-20 MG/5ML suspension  Every 6 hours PRN        12/06/20 1936           Loni Beckwith, PA-C 12/07/20 0203    Lorelle Gibbs, DO 12/09/20 1932

## 2020-12-30 ENCOUNTER — Ambulatory Visit: Payer: No Typology Code available for payment source | Admitting: Gastroenterology

## 2020-12-30 ENCOUNTER — Encounter: Payer: Self-pay | Admitting: Gastroenterology

## 2020-12-30 VITALS — BP 102/68 | HR 68 | Ht 66.0 in | Wt 185.0 lb

## 2020-12-30 DIAGNOSIS — R1031 Right lower quadrant pain: Secondary | ICD-10-CM | POA: Diagnosis not present

## 2020-12-30 DIAGNOSIS — K219 Gastro-esophageal reflux disease without esophagitis: Secondary | ICD-10-CM

## 2020-12-30 DIAGNOSIS — K5900 Constipation, unspecified: Secondary | ICD-10-CM

## 2020-12-30 MED ORDER — OMEPRAZOLE 40 MG PO CPDR: 40.0000 mg | DELAYED_RELEASE_CAPSULE | Freq: Every day | ORAL | 11 refills | Status: DC

## 2020-12-30 NOTE — Progress Notes (Signed)
HPI: This is a very pleasant 35 year old woman who was referred to me by [place], Betty G, MD  to evaluate constipation, GERD, right lower quadrant abdominal pain.    She has had chronic constipation most of her life.  She gets relief with Senokot tabs about 4 times a week.  She never sees blood in her stool.  If she does not take the Senokot she will probably not move her bowels for 3 or 4 days.  Colon cancer does not run in her family  She has chronic GERD really since her gastric sleeve surgery.  Burning in her chest burning in her esophagus.  Zantac sometimes will help.  She was given a prescription for omeprazole but never took them.  She has no dysphagia.  Her weight is stable over the past 2 or 3 years after losing about 100 pounds following her gastric sleeve in 2016.  Her biggest issue is intermittent sudden periumbilical right-sided abdominal pains.  When she feels this pain is pretty severe and she will also feel a lump at the site just to the right of her umbilicus and the umbilical incision.  Sometimes she will rub the site of the lump and it would go away and her pain will improve.  These pains occur about twice a week will last 1 to 2 hours and have been increasing in frequency and intensity over the past 2 years or so  She does not have fevers or chills, no nausea or vomiting with this.   Old Data Reviewed:  Lab work February 2022 CBC was normal except for hemoglobin 11.5, complete metabolic profile was normal, urine pregnancy test was negative   Ultrasound January 2021 for upper abdominal pain showed gallstones in her gallbladder was otherwise normal  CT scan angio chest abdomen pelvis for dissection February 2022 indication "abdominal pain" showed cholelithiasis, status post gastric bypass and appendectomy, otherwise essentially normal examination    Review of systems: Pertinent positive and negative review of systems were noted in the above HPI section. All other  review negative.   Past Medical History:  Diagnosis Date  . Allergies   . Anxiety   . Gallstones   . Hyperthyroidism     Past Surgical History:  Procedure Laterality Date  . ADENOIDECTOMY    . CESAREAN SECTION  09/28/2005, 11/28/2007  . LAPAROSCOPIC APPENDECTOMY N/A 01/06/2016   Procedure: APPENDECTOMY LAPAROSCOPIC;  Surgeon: Luretha Murphy, MD;  Location: [facility];  Service: General;  Laterality: N/A;  . stomach banding    . TONSILLECTOMY      Current Outpatient Medications  Medication Sig Dispense Refill  . alum & mag hydroxide-simeth (MYLANTA) 200-200-20 MG/5ML suspension Take 15 mLs by mouth every 6 (six) hours as needed for indigestion or heartburn. 355 mL 0  . cetirizine (ZYRTEC ALLERGY) 10 MG tablet Take 1 tablet (10 mg total) by mouth daily. 30 tablet 11  . Cyanocobalamin (VITAMIN B-12 CR PO) Take by mouth daily.    . ergocalciferol (VITAMIN D2) 1.25 MG (50000 UT) capsule Take 50,000 Units by mouth once a week.    . ferrous sulfate 325 (65 FE) MG tablet Take 325 mg by mouth daily with breakfast.    . fluticasone (FLONASE) 50 MCG/ACT nasal spray Place 1 spray into both nostrils daily. (Patient taking differently: Place 1 spray into both nostrils daily as needed.) 16 g PRN  . hydrOXYzine (ATARAX/VISTARIL) 25 MG tablet Take 1 tablet (25 mg total) by mouth 3 (three) times daily as  needed for anxiety. 30 tablet 1  . levonorgestrel-ethinyl estradiol (NORDETTE) 0.15-30 MG-MCG tablet Take 1 tablet by mouth daily. 28 tablet 11  . Multiple Vitamin (MULTIVITAMIN) tablet Take 1 tablet by mouth daily.    Marland Kitchen senna (SENOKOT) 8.6 MG tablet Take 1 tablet by mouth daily as needed for constipation.     No current facility-administered medications for this visit.    Allergies as of 12/30/2020 - Review Complete 12/30/2020  Allergen Reaction Noted  . Almond (diagnostic) Hives and Swelling 01/06/2016    Family History  Problem Relation Age of Onset  . Hypertension Mother   . Thyroid disease  Mother        multinodular goiter  . Diabetes Maternal Uncle   . Healthy Brother        x1  . Hypertension Maternal Grandmother   . Hypertension Maternal Grandfather   . Cancer Neg Hx   . Heart disease Neg Hx   . Colon cancer Neg Hx     Social History   Socioeconomic History  . Marital status: Divorced    Spouse name: SEPARATED  . Number of children: 2  . Years of education: 15+  . Highest education level: Not on file  Occupational History  . Occupation: Dentist: [employer]  Tobacco Use  . Smoking status: Never Smoker  . Smokeless tobacco: Never Used  Vaping Use  . Vaping Use: Never used  Substance and Sexual Activity  . Alcohol use: Yes    Comment: occasional  . Drug use: Not Currently    Types: Marijuana  . Sexual activity: Yes    Partners: Male    Birth control/protection: I.U.D.    Comment: Mirena  Other Topics Concern  . Not on file  Social History Narrative   Lives with her two sons, her uncle and cousin. Separated from husband 11/2010.   Social Determinants of Health   Financial Resource Strain: Not on file  Food Insecurity: Not on file  Transportation Needs: Not on file  Physical Activity: Not on file  Stress: Not on file  Social Connections: Not on file  Intimate Partner Violence: Not on file     Physical Exam: BP 102/68   Pulse 68   Ht 5\' 6"  (1.676 m)   Wt 185 lb (83.9 kg)   BMI 29.86 kg/m  Constitutional: generally well-appearing Psychiatric: alert and oriented x3 Eyes: extraocular movements intact Mouth: oral pharynx moist, no lesions Neck: supple no lymphadenopathy Cardiovascular: heart regular rate and rhythm Lungs: clear to auscultation bilaterally Abdomen: soft, nontender, nondistended, no obvious ascites, no peritoneal signs, normal bowel sounds; no obvious hernia palpated at the site of her umbilical port incision Extremities: no lower extremity edema bilaterally Skin: no lesions on visible  extremities   Assessment and plan: 35 y.o. female with chronic constipation, chronic GERD, intermittent periumbilical abdominal pains  I recommend a trial of fiber supplements Citrucel for her chronic lifelong constipation in hopes that she will be able to wean off Senokot which can cause tolerance to build up.  I am calling in a prescription for omeprazole 40 mg for her obvious GERD symptoms.  She will take 1 pill once daily shortly before her first meal the day.  She has no alarm symptoms that would warrant endoscopic testing.  I think she is having pain from an intermittent periumbilical, periincisional hernia.  She does not have hernia on examination today but her description of the events is pretty convincing to  me.  She tells me she had very good experience with her surgeon who performed the sleeve surgery for her 6 years ago at [facility] and I recommended that that would be a great first place to start to discuss whether or not this is a hernia and to consider further testing or treatment for it.  She is going to return to see me in 2 or 3 months to see how she is responding to the above.   I do not think she is having any symptoms from the cholelithiasis.   Please see the "Patient Instructions" section for addition details about the plan.   Rob Bunting, MD [facility] 12/30/2020, 2:38 PM  Cc: [place], Betty G, MD  Total time on date of encounter was 45  minutes (this included time spent preparing to see the patient reviewing records; obtaining and/or reviewing separately obtained history; performing a medically appropriate exam and/or evaluation; counseling and educating the patient and family if present; ordering medications, tests or procedures if applicable; and documenting clinical information in the health record).

## 2020-12-30 NOTE — Patient Instructions (Addendum)
If you are age 36 or younger, your body mass index should be between 19-25. Your Body mass index is 29.86 kg/m. If this is out of the aformentioned range listed, please consider follow up with your Primary Care Provider.   We have sent the following medications to your pharmacy for you to pick up at your convenience:  START: omeprazole 40mg  take one capsule shortly before breakfast meal each day.  Please start taking citrucel (orange flavored) powder fiber supplement.  This may cause some bloating at first but that usually goes away. Begin with a small spoonful and work your way up to a large, heaping spoonful daily over a week.  Please follow up with surgeon from [facility] regarding possible incisional/internal hernia.  You are scheduled to follow up at our office on 03-14-2021 at 8:50am.  Thank you for entrusting me with your care and choosing [facility].  Dr [facility]

## 2021-02-06 ENCOUNTER — Other Ambulatory Visit: Payer: Self-pay | Admitting: Family Medicine

## 2021-02-06 DIAGNOSIS — E559 Vitamin D deficiency, unspecified: Secondary | ICD-10-CM

## 2021-03-14 ENCOUNTER — Ambulatory Visit: Payer: No Typology Code available for payment source | Admitting: Gastroenterology

## 2021-03-20 ENCOUNTER — Ambulatory Visit: Payer: No Typology Code available for payment source | Admitting: Nurse Practitioner

## 2021-03-22 ENCOUNTER — Other Ambulatory Visit: Payer: Self-pay | Admitting: Family Medicine

## 2021-04-11 HISTORY — PX: CHOLECYSTECTOMY: SHX55

## 2021-05-07 ENCOUNTER — Encounter: Payer: Self-pay | Admitting: Family Medicine

## 2021-05-07 ENCOUNTER — Other Ambulatory Visit: Payer: Self-pay

## 2021-05-07 ENCOUNTER — Ambulatory Visit: Payer: No Typology Code available for payment source | Admitting: Family Medicine

## 2021-05-07 VITALS — BP 120/70 | HR 67 | Resp 12 | Ht 66.0 in | Wt 192.2 lb

## 2021-05-07 DIAGNOSIS — F419 Anxiety disorder, unspecified: Secondary | ICD-10-CM

## 2021-05-07 DIAGNOSIS — L219 Seborrheic dermatitis, unspecified: Secondary | ICD-10-CM

## 2021-05-07 DIAGNOSIS — E538 Deficiency of other specified B group vitamins: Secondary | ICD-10-CM | POA: Diagnosis not present

## 2021-05-07 DIAGNOSIS — R5383 Other fatigue: Secondary | ICD-10-CM | POA: Diagnosis not present

## 2021-05-07 DIAGNOSIS — L659 Nonscarring hair loss, unspecified: Secondary | ICD-10-CM

## 2021-05-07 DIAGNOSIS — M2550 Pain in unspecified joint: Secondary | ICD-10-CM

## 2021-05-07 LAB — CBC
HCT: 34.6 % — ABNORMAL LOW (ref 36.0–46.0)
Hemoglobin: 11.3 g/dL — ABNORMAL LOW (ref 12.0–15.0)
MCHC: 32.6 g/dL (ref 30.0–36.0)
MCV: 83.4 fl (ref 78.0–100.0)
Platelets: 256 10*3/uL (ref 150.0–400.0)
RBC: 4.15 Mil/uL (ref 3.87–5.11)
RDW: 13.8 % (ref 11.5–15.5)
WBC: 4.1 10*3/uL (ref 4.0–10.5)

## 2021-05-07 LAB — TSH: TSH: 0.03 u[IU]/mL — ABNORMAL LOW (ref 0.35–5.50)

## 2021-05-07 LAB — T4, FREE: Free T4: 0.97 ng/dL (ref 0.60–1.60)

## 2021-05-07 LAB — SEDIMENTATION RATE: Sed Rate: 23 mm/hr — ABNORMAL HIGH (ref 0–20)

## 2021-05-07 LAB — C-REACTIVE PROTEIN: CRP: <1 mg/dL (ref 0.5–20.0)

## 2021-05-07 LAB — IRON: Iron: 102 ug/dL (ref 42–145)

## 2021-05-07 LAB — VITAMIN B12: Vitamin B-12: 614 pg/mL (ref 211–911)

## 2021-05-07 MED ORDER — FLUOCINONIDE 0.05 % EX GEL
1.0000 | Freq: Two times a day (BID) | CUTANEOUS | 0 refills | Status: DC
Start: 2021-05-07 — End: 2023-04-09

## 2021-05-07 MED ORDER — KETOCONAZOLE 2 % EX SHAM: 1.0000 "application " | MEDICATED_SHAMPOO | CUTANEOUS | 0 refills | Status: DC

## 2021-05-07 MED ORDER — SERTRALINE HCL 25 MG PO TABS: 25.0000 mg | ORAL_TABLET | Freq: Every day | ORAL | 1 refills | Status: DC

## 2021-05-07 NOTE — Progress Notes (Signed)
Chief Complaint  Patient presents with   Alopecia    Beautician noticed on 7/12. Pt does have a h/o graves disease.   HPI: Judith Moore is a 35 y.o. female with hx of thyperthyroidism,anxiety,HLD,GERD,and s/p bariatric surgery here today complaining of hair loss as described above. A couple of alopecic areas on scalp. The biggest one left parietal with rounded area, hair seems to be growing back.Affected areas with no scalp lesions or erythema.  Other This is a new problem. The current episode started 1 to 4 weeks ago. The problem occurs constantly. The problem has been unchanged. Associated symptoms include arthralgias. Pertinent negatives include no abdominal pain, anorexia, change in bowel habit, chest pain, chills, congestion, coughing, diaphoresis, headaches, nausea, numbness, sore throat, swollen glands, urinary symptoms, vertigo, visual change, vomiting or weakness. Nothing aggravates the symptoms. She has tried nothing for the symptoms. The treatment provided no relief.   Scattered scalp pruritus and flaking areas. She has been under a lot of stress. States that "weird things" have happened for the past 9 months. 2 weeks ago she had cholecystectomy. Intermittent hives since 08/2020.  Joint pain (Knees and hands) and myalgias: Intermittently for years, started again a few weeks ago. States that at age 65 she was told she had "arthritis."  No stiffness.  Negative for fever,oral lesions,joint edema, or erythema. FHx positive for RA.  Fatigue, worse for the past month. Sleeping about 6 hours, waking up a few times through the night and having a hard time going back to sleep.  Lab Results  Component Value Date   TSH 0.55 07/05/2019   She has an appt with her endocrinologist 05/26/21. She also has a dermatologist.  B12 deficiency: She is on B12 supplementation. 07/2020 B12 was 503.  For the past few days she has felt "down." Hx of depression and anxiety. She tried  Fluoxetine before, caused negative thoughts and nightmares.  She take Hydroxyzine 25 mg daily prn. Planning on starting CBT, she has 6 free sessions through work.  Depression screen [facility] 2/9 05/07/2021 12/30/2018 07/09/2017 04/20/2013  Decreased Interest 3 0 0 0  Down, Depressed, Hopeless 2 1 0 0  PHQ - 2 Score 5 1 0 0  Altered sleeping 3 1 - -  Tired, decreased energy 3 2 - -  Change in appetite 2 0 - -  Feeling bad or failure about yourself  2 0 - -  Trouble concentrating 0 0 - -  Moving slowly or fidgety/restless 0 0 - -  Suicidal thoughts 0 0 - -  PHQ-9 Score 15 4 - -  Difficult doing work/chores Somewhat difficult Somewhat difficult - -   Review of Systems  Constitutional:  Negative for chills and diaphoresis.  HENT:  Negative for congestion and sore throat.   Respiratory:  Negative for cough.   Cardiovascular:  Negative for chest pain.  Gastrointestinal:  Negative for abdominal pain, anorexia, change in bowel habit, nausea and vomiting.  Musculoskeletal:  Positive for arthralgias.  Neurological:  Negative for vertigo, weakness, numbness and headaches.  Rest see pertinent positives and negatives per HPI.  Current Outpatient Medications on File Prior to Visit  Medication Sig Dispense Refill   alum & mag hydroxide-simeth (MYLANTA) 200-200-20 MG/5ML suspension Take 15 mLs by mouth every 6 (six) hours as needed for indigestion or heartburn. 355 mL 0   cetirizine (ZYRTEC ALLERGY) 10 MG tablet Take 1 tablet (10 mg total) by mouth daily. 30 tablet 11   Cyanocobalamin (VITAMIN B-12  CR PO) Take by mouth daily.     ferrous sulfate 325 (65 FE) MG tablet Take 325 mg by mouth daily with breakfast.     fluticasone (FLONASE) 50 MCG/ACT nasal spray Place 1 spray into both nostrils daily. (Patient taking differently: Place 1 spray into both nostrils daily as needed.) 16 g PRN   hydrOXYzine (ATARAX/VISTARIL) 25 MG tablet Take 1 tablet (25 mg total) by mouth 3 (three) times daily as needed for  anxiety. 30 tablet 1   levonorgestrel-ethinyl estradiol (NORDETTE) 0.15-30 MG-MCG tablet Take 1 tablet by mouth daily. 28 tablet 11   Multiple Vitamin (MULTIVITAMIN) tablet Take 1 tablet by mouth daily.     omeprazole (PRILOSEC) 40 MG capsule Take 1 capsule (40 mg total) by mouth daily. 30 capsule 11   senna (SENOKOT) 8.6 MG tablet Take 1 tablet by mouth daily as needed for constipation.     Vitamin D, Ergocalciferol, (DRISDOL) 1.25 MG (50000 UNIT) CAPS capsule Take 1 capsule (50,000 Units total) by mouth every 14 (fourteen) days. 6 capsule 1   No current facility-administered medications on file prior to visit.   Past Medical History:  Diagnosis Date   Allergies    Anxiety    Gallstones    Hyperthyroidism    Allergies  Allergen Reactions   Almond (Diagnostic) Hives and Swelling    Almonds    Social History   Socioeconomic History   Marital status: Divorced    Spouse name: SEPARATED   Number of children: 2   Years of education: 15+   Highest education level: Not on file  Occupational History   Occupation: HEALTH CARE BILLING SPECIALIST    Employer: [employer]  Tobacco Use   Smoking status: Never   Smokeless tobacco: Never  Vaping Use   Vaping Use: Never used  Substance and Sexual Activity   Alcohol use: Yes    Comment: occasional   Drug use: Not Currently    Types: Marijuana   Sexual activity: Yes    Partners: Male    Birth control/protection: I.U.D.    Comment: Mirena  Other Topics Concern   Not on file  Social History Narrative   Lives with her two sons, her uncle and cousin. Separated from husband 11/2010.   Social Determinants of Health   Financial Resource Strain: Not on file  Food Insecurity: Not on file  Transportation Needs: Not on file  Physical Activity: Not on file  Stress: Not on file  Social Connections: Not on file   Vitals:   05/07/21 0857  BP: 120/70  Pulse: 67  Resp: 12  SpO2: 99%   Body mass index is 31.03 kg/m.  Physical  Exam Vitals and nursing note reviewed.  Constitutional:      General: She is not in acute distress.    Appearance: She is well-developed.  HENT:     Head: Normocephalic and atraumatic.      Comments: 2 areas of scaly scalp, no erythema.     Mouth/Throat:     Mouth: Mucous membranes are moist.     Pharynx: Oropharynx is clear.  Eyes:     Conjunctiva/sclera: Conjunctivae normal.  Cardiovascular:     Rate and Rhythm: Normal rate and regular rhythm.     Heart sounds: No murmur heard. Pulmonary:     Effort: Pulmonary effort is normal. No respiratory distress.     Breath sounds: Normal breath sounds.  Abdominal:     Palpations: Abdomen is soft.     Tenderness: There  is no abdominal tenderness.  Musculoskeletal:     Comments: No signs of synovitis.  Lymphadenopathy:     Cervical: No cervical adenopathy.  Skin:    General: Skin is warm.     Findings: No erythema or rash.  Neurological:     General: No focal deficit present.     Mental Status: She is alert and oriented to person, place, and time.     Cranial Nerves: No cranial nerve deficit.     Gait: Gait normal.  Psychiatric:        Mood and Affect: Mood is anxious.        Thought Content: Thought content does not include suicidal ideation. Thought content does not include suicidal plan.     Comments: Well groomed, good eye contact.   ASSESSMENT AND PLAN:  [name] was seen today for alopecia.  Diagnoses and all orders for this visit: Orders Placed This Encounter  Procedures   C-reactive protein   Rheumatoid factor   Sedimentation rate   T4, free   TSH   Cyclic citrul peptide antibody, IgG   ANA   CBC   Iron   Vitamin B12   Lab Results  Component Value Date   TSH 0.03 (L) 05/07/2021   Lab Results  Component Value Date   ESRSEDRATE 23 (H) 05/07/2021   Lab Results  Component Value Date   WBC 4.1 05/07/2021   HGB 11.3 (L) 05/07/2021   HCT 34.6 (L) 05/07/2021   MCV 83.4 05/07/2021   PLT 256.0 05/07/2021    Lab Results  Component Value Date   VITAMINB12 614 05/07/2021   Polyarthralgia Possible etiologies discussed. Rheumatologic work up today. Further recommendations according to lab results.  Fatigue, unspecified type We discussed possible etiologies: Systemic illness, immunologic,endocrinology,sleep disorder, psychiatric/psychologic, infectious,medications side effects, and idiopathic. ?Rheumatologic disorder.  Further recommendations will be given according to lab results.  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 Educated about Dx,prognosis,and treatment options. She has appt with dermatologist.  Vitamin B 12 deficiency Continue B12 supplementation. Further recommendation according to B12 result.  Seborrheic dermatitis of scalp We discussed Dx and treatment options. Nizoral shampoo and topical steroid recommended. Continue following with dermatologist.  -     ketoconazole (NIZORAL) 2 % shampoo; Apply 1 application topically 2 (two) times a week. -     fluocinonide gel (LIDEX) 0.05 %; Apply 1 application topically 2 (two) times daily.  Anxiety disorder, unspecified type We discussed treatment options. Sertraline started today, some side effects discussed. Instructed about warning signs. Starting CBT.  -     sertraline (ZOLOFT) 25 MG tablet; Take 1 tablet (25 mg total) by mouth daily.   Return in about 6 weeks (around 06/18/2021).   Jaelene Garciagarcia G. [place], MD  [facility]. [place] office.  Discharge Instructions   None

## 2021-05-07 NOTE — Patient Instructions (Addendum)
A few things to remember from today's visit:  Polyarthralgia - Plan: C-reactive protein, Rheumatoid factor, Sedimentation rate, Cyclic citrul peptide antibody, IgG  Alopecia - Plan: T4, free, TSH, ANA, CBC, Iron  Vitamin B 12 deficiency - Plan: Vitamin B12  Seborrheic dermatitis of scalp  Fatigue, unspecified type  Today we started Sertraline 25 mg, this type of medications can increase suicidal risk. This is more prevalent among children,adolecents, and young adults with major depression or other psychiatric disorders. It can also make depression worse. Most common side effects are gastrointestinal, self limited after a few weeks: diarrhea, nausea, constipation  Or diarrhea among some.  In general it is well tolerated. We will follow closely.  If you need refills please call your pharmacy. Do not use My Chart to request refills or for acute issues that need immediate attention.   Please be sure medication list is accurate. If a new problem present, please set up appointment sooner than planned today.

## 2021-05-09 ENCOUNTER — Encounter: Payer: Self-pay | Admitting: Family Medicine

## 2021-05-09 LAB — ANA: Anti Nuclear Antibody (ANA): NEGATIVE

## 2021-05-09 LAB — RHEUMATOID FACTOR: Rheumatoid fact SerPl-aCnc: <14 IU/mL (ref <14)

## 2021-05-09 LAB — CYCLIC CITRUL PEPTIDE ANTIBODY, IGG: Cyclic Citrullin Peptide Ab: <16 UNITS

## 2021-05-10 ENCOUNTER — Encounter: Payer: Self-pay | Admitting: Family Medicine

## 2021-05-26 ENCOUNTER — Ambulatory Visit (INDEPENDENT_AMBULATORY_CARE_PROVIDER_SITE_OTHER): Payer: No Typology Code available for payment source | Admitting: Endocrinology

## 2021-05-26 ENCOUNTER — Other Ambulatory Visit: Payer: Self-pay

## 2021-05-26 VITALS — BP 108/60 | HR 65 | Ht 66.0 in | Wt 193.0 lb

## 2021-05-26 DIAGNOSIS — E0590 Thyrotoxicosis, unspecified without thyrotoxic crisis or storm: Secondary | ICD-10-CM | POA: Diagnosis not present

## 2021-05-26 MED ORDER — METHIMAZOLE 10 MG PO TABS: 10.0000 mg | ORAL_TABLET | Freq: Every day | ORAL | 3 refills | Status: DC

## 2021-05-26 NOTE — Progress Notes (Signed)
Subjective:    Patient ID: Judith Moore, female    DOB: 1986-09-07, 35 y.o.   MRN: [id number]  HPI I last saw pt in 2019: Pt returns for f/u of hyperthyroidism (dx'ed 2011; she was started on tapazole in 2016;  This was chosen as rx, due to severity, but she chooses to continue, due to cost; she has never had thyroid imaging; she she she is not at risk for pregnancy; she has lost weight, but this is presumed caused by 2016 bariatric surgery; she has been off tapazole since mid-2018, due to normalization of TFT).  She has anxiety, tremor, palpitations, fatigue, hair loss, and watery eyes.  She takes OC's.   Past Medical History:  Diagnosis Date   Allergies    Anxiety    Gallstones    Hyperthyroidism     Past Surgical History:  Procedure Laterality Date   ADENOIDECTOMY     CESAREAN SECTION  09/28/2005, 11/28/2007   LAPAROSCOPIC APPENDECTOMY N/A 01/06/2016   Procedure: APPENDECTOMY LAPAROSCOPIC;  Surgeon: Luretha Murphy, MD;  Location: [facility];  Service: General;  Laterality: N/A;   stomach banding     TONSILLECTOMY      Social History   Socioeconomic History   Marital status: Divorced    Spouse name: SEPARATED   Number of children: 2   Years of education: 15+   Highest education level: Not on file  Occupational History   Occupation: HEALTH CARE BILLING SPECIALIST    Employer: [employer]  Tobacco Use   Smoking status: Never   Smokeless tobacco: Never  Vaping Use   Vaping Use: Never used  Substance and Sexual Activity   Alcohol use: Yes    Comment: occasional   Drug use: Not Currently    Types: Marijuana   Sexual activity: Yes    Partners: Male    Birth control/protection: I.U.D.    Comment: Mirena  Other Topics Concern   Not on file  Social History Narrative   Lives with her two sons, her uncle and cousin. Separated from husband 11/2010.   Social Determinants of Health   Financial Resource Strain: Not on file  Food Insecurity: Not on file  Transportation  Needs: Not on file  Physical Activity: Not on file  Stress: Not on file  Social Connections: Not on file  Intimate Partner Violence: Not on file    Current Outpatient Medications on File Prior to Visit  Medication Sig Dispense Refill   alum & mag hydroxide-simeth (MYLANTA) 200-200-20 MG/5ML suspension Take 15 mLs by mouth every 6 (six) hours as needed for indigestion or heartburn. 355 mL 0   cetirizine (ZYRTEC ALLERGY) 10 MG tablet Take 1 tablet (10 mg total) by mouth daily. 30 tablet 11   Cyanocobalamin (VITAMIN B-12 CR PO) Take by mouth daily.     ferrous sulfate 325 (65 FE) MG tablet Take 325 mg by mouth daily with breakfast.     fluocinonide gel (LIDEX) 0.05 % Apply 1 application topically 2 (two) times daily. 60 g 0   fluticasone (FLONASE) 50 MCG/ACT nasal spray Place 1 spray into both nostrils daily. (Patient taking differently: Place 1 spray into both nostrils daily as needed.) 16 g PRN   hydrOXYzine (ATARAX/VISTARIL) 25 MG tablet Take 1 tablet (25 mg total) by mouth 3 (three) times daily as needed for anxiety. 30 tablet 1   ketoconazole (NIZORAL) 2 % shampoo Apply 1 application topically 2 (two) times a week. 120 mL 0   levonorgestrel-ethinyl estradiol (NORDETTE) 0.15-30  MG-MCG tablet Take 1 tablet by mouth daily. 28 tablet 11   Multiple Vitamin (MULTIVITAMIN) tablet Take 1 tablet by mouth daily.     omeprazole (PRILOSEC) 40 MG capsule Take 1 capsule (40 mg total) by mouth daily. 30 capsule 11   senna (SENOKOT) 8.6 MG tablet Take 1 tablet by mouth daily as needed for constipation.     sertraline (ZOLOFT) 25 MG tablet Take 1 tablet (25 mg total) by mouth daily. 30 tablet 1   Vitamin D, Ergocalciferol, (DRISDOL) 1.25 MG (50000 UNIT) CAPS capsule Take 1 capsule (50,000 Units total) by mouth every 14 (fourteen) days. 6 capsule 1   No current facility-administered medications on file prior to visit.    Allergies  Allergen Reactions   Almond (Diagnostic) Hives and Swelling    Almonds     Family History  Problem Relation Age of Onset   Hypertension Mother    Thyroid disease Mother        multinodular goiter   Diabetes Maternal Uncle    Healthy Brother        x1   Hypertension Maternal Grandmother    Hypertension Maternal Grandfather    Cancer Neg Hx    Heart disease Neg Hx    Colon cancer Neg Hx     BP 108/60 (BP Location: Right Arm, Patient Position: Sitting, Cuff Size: Large)   Pulse 65   Ht 5\' 6"  (1.676 m)   Wt 193 lb (87.5 kg)   SpO2 95%   BMI 31.15 kg/m    Review of Systems Denies fever    Objective:   Physical Exam VITAL SIGNS:  See vs page GENERAL: no distress EYES: no proptosis NECK: thyroid is 3-5 x normal size, with smooth surface.  No palpable nodule.   SKIN: not diaphoretic NEURO: no tremor   Lab Results  Component Value Date   TSH 0.03 (L) 05/07/2021   I have reviewed outside records, and summarized: Pt was noted to have low TSH, and referred here.  Since she was last seen here in 2019, she was followed by Dr 2020.  TFT were normal until 2022.       Assessment & Plan:  Hyperthyroidism, recurrent/uncontrolled off rx.    Patient Instructions  I have sent a prescription to your pharmacy, to resume the methimazole. If ever you have fever while taking methimazole, stop it and call 2023, even if the reason is obvious, because of the risk of a rare side-effect. It is best to never miss the medication.  However, if you do miss it, next best is to double up the next time.   Please come back for a follow-up appointment in 6 weeks.

## 2021-05-26 NOTE — Patient Instructions (Signed)
I have sent a prescription to your pharmacy, to resume the methimazole. If ever you have fever while taking methimazole, stop it and call us, even if the reason is obvious, because of the risk of a rare side-effect. It is best to never miss the medication.  However, if you do miss it, next best is to double up the next time.   Please come back for a follow-up appointment in 6 weeks.

## 2021-06-03 ENCOUNTER — Other Ambulatory Visit: Payer: Self-pay | Admitting: Family Medicine

## 2021-06-03 DIAGNOSIS — L219 Seborrheic dermatitis, unspecified: Secondary | ICD-10-CM

## 2021-07-05 ENCOUNTER — Other Ambulatory Visit: Payer: Self-pay | Admitting: Obstetrics

## 2021-07-05 DIAGNOSIS — Z3041 Encounter for surveillance of contraceptive pills: Secondary | ICD-10-CM

## 2021-07-09 ENCOUNTER — Other Ambulatory Visit: Payer: Self-pay | Admitting: Family Medicine

## 2021-07-09 DIAGNOSIS — F419 Anxiety disorder, unspecified: Secondary | ICD-10-CM

## 2021-07-10 ENCOUNTER — Other Ambulatory Visit: Payer: Self-pay | Admitting: Family Medicine

## 2021-07-10 DIAGNOSIS — L219 Seborrheic dermatitis, unspecified: Secondary | ICD-10-CM

## 2021-07-16 ENCOUNTER — Ambulatory Visit: Payer: No Typology Code available for payment source | Admitting: Endocrinology

## 2021-08-05 NOTE — Progress Notes (Signed)
HPI: Judith Moore is a 35 y.o. female, who is here today for her routine physical.  Last CPE: 07/29/20  Regular exercise 3 or more time per week: Walking 1.5 at least 5 times miles for 3 months. Following a healthful diet: She is eating one meal daily and smoothies. Eating more soups, late at night.  She lives with her 2 children.  Chronic medical problems: GERD,hypothyroidism, obesity s/p bariatric surgery,anxiety disorder, and allergies among some. She follows regularly with endocrinologist, Dr. Everardo All, last seen in 05/2021. Last Pap smear: 07/18/2020 [facility]. Established with gynecologist, Dr. Clearance Coots.  Last seen in 08/2020.  Immunization History  Administered Date(s) Administered   Tdap 07/29/2020   Hep C screening: 06/30/18 NR.  Concerns today. Frustrated because she has gained some weight and has been hard for her to go back to a healthful diet. S/P bariatric procedure.  She has been trying to reach her surgeon to ask for a prescription for phentermine, which she tried in the past. Having some hot flashes, she thinks she may be getting close to menopause. Having regular menses.  Hypothyroidism has not been well controlled, she is following with Dr. Everardo All next week.  Lab Results  Component Value Date   TSH 0.03 (L) 05/07/2021   Anxiety: Stopped Sertraline, 06/2021. She feels like regular exercise and praying have helped. She prefers no medication for now.  Vitamin D deficiency: She has not been consistent with taking vitamin D daily supplementation. Last 25 OH vitamin D about a year ago was 10 (12.4).  Review of Systems  Constitutional:  Positive for fatigue. Negative for appetite change and fever.  HENT:  Negative for hearing loss, mouth sores, sore throat, trouble swallowing and voice change.   Eyes:  Negative for redness and visual disturbance.  Respiratory:  Negative for cough, shortness of breath and wheezing.   Cardiovascular:  Negative for chest pain  and leg swelling.  Gastrointestinal:  Negative for abdominal pain, nausea and vomiting.       No changes in bowel habits.  Endocrine: Negative for cold intolerance, heat intolerance, polydipsia, polyphagia and polyuria.  Genitourinary:  Negative for decreased urine volume, dysuria, hematuria, vaginal bleeding and vaginal discharge.  Musculoskeletal:  Negative for gait problem and myalgias.  Skin:  Negative for color change and rash.  Allergic/Immunologic: Positive for environmental allergies.  Neurological:  Negative for syncope, weakness and headaches.  Hematological:  Negative for adenopathy. Does not bruise/bleed easily.  Psychiatric/Behavioral:  Negative for confusion.   All other systems reviewed and are negative.  Current Outpatient Medications on File Prior to Visit  Medication Sig Dispense Refill   ALTAVERA 0.15-30 MG-MCG tablet TAKE 1 TABLET BY MOUTH EVERY DAY 84 tablet 3   alum & mag hydroxide-simeth (MYLANTA) 200-200-20 MG/5ML suspension Take 15 mLs by mouth every 6 (six) hours as needed for indigestion or heartburn. 355 mL 0   cetirizine (ZYRTEC ALLERGY) 10 MG tablet Take 1 tablet (10 mg total) by mouth daily. 30 tablet 11   Cyanocobalamin (VITAMIN B-12 CR PO) Take by mouth daily.     ferrous sulfate 325 (65 FE) MG tablet Take 325 mg by mouth daily with breakfast.     fluocinonide gel (LIDEX) 0.05 % Apply 1 application topically 2 (two) times daily. 60 g 0   fluticasone (FLONASE) 50 MCG/ACT nasal spray Place 1 spray into both nostrils daily. (Patient taking differently: Place 1 spray into both nostrils daily as needed.) 16 g PRN   hydrOXYzine (ATARAX/VISTARIL) 25 MG  tablet Take 1 tablet (25 mg total) by mouth 3 (three) times daily as needed for anxiety. 30 tablet 1   ketoconazole (NIZORAL) 2 % shampoo APPLY 1 APPLICATION TOPICALLY 2 (TWO) TIMES A WEEK. 120 mL 0   methimazole (TAPAZOLE) 10 MG tablet Take 1 tablet (10 mg total) by mouth daily. Taking 40 mg daily 90 tablet 3    Multiple Vitamin (MULTIVITAMIN) tablet Take 1 tablet by mouth daily.     omeprazole (PRILOSEC) 40 MG capsule Take 1 capsule (40 mg total) by mouth daily. 30 capsule 11   senna (SENOKOT) 8.6 MG tablet Take 1 tablet by mouth daily as needed for constipation.     sertraline (ZOLOFT) 25 MG tablet TAKE 1 TABLET (25 MG TOTAL) BY MOUTH DAILY. 30 tablet 3   Vitamin D, Ergocalciferol, (DRISDOL) 1.25 MG (50000 UNIT) CAPS capsule Take 1 capsule (50,000 Units total) by mouth every 14 (fourteen) days. 6 capsule 1   No current facility-administered medications on file prior to visit.   Past Medical History:  Diagnosis Date   Allergies    Anxiety    Gallstones    Hyperthyroidism     Past Surgical History:  Procedure Laterality Date   ADENOIDECTOMY     CESAREAN SECTION  09/28/2005, 11/28/2007   LAPAROSCOPIC APPENDECTOMY N/A 01/06/2016   Procedure: APPENDECTOMY LAPAROSCOPIC;  Surgeon: Luretha Murphy, MD;  Location: [facility];  Service: General;  Laterality: N/A;   stomach banding     TONSILLECTOMY      Allergies  Allergen Reactions   Almond (Diagnostic) Hives and Swelling    Almonds    Family History  Problem Relation Age of Onset   Hypertension Mother    Thyroid disease Mother        multinodular goiter   Diabetes Maternal Uncle    Healthy Brother        x1   Hypertension Maternal Grandmother    Hypertension Maternal Grandfather    Cancer Neg Hx    Heart disease Neg Hx    Colon cancer Neg Hx     Social History   Socioeconomic History   Marital status: Divorced    Spouse name: SEPARATED   Number of children: 2   Years of education: 15+   Highest education level: Not on file  Occupational History   Occupation: HEALTH CARE BILLING SPECIALIST    Employer: [employer]  Tobacco Use   Smoking status: Never   Smokeless tobacco: Never  Vaping Use   Vaping Use: Never used  Substance and Sexual Activity   Alcohol use: Yes    Comment: occasional   Drug use: Not Currently    Types:  Marijuana   Sexual activity: Yes    Partners: Male    Birth control/protection: I.U.D.    Comment: Mirena  Other Topics Concern   Not on file  Social History Narrative   Lives with her two sons, her uncle and cousin. Separated from husband 11/2010.   Social Determinants of Health   Financial Resource Strain: Not on file  Food Insecurity: Not on file  Transportation Needs: Not on file  Physical Activity: Not on file  Stress: Not on file  Social Connections: Not on file   Vitals:   08/06/21 0747  BP: 100/70  Pulse: 77  Resp: 16  SpO2: 98%   Body mass index is 31.64 kg/m.  Wt Readings from Last 3 Encounters:  08/06/21 196 lb (88.9 kg)  05/26/21 193 lb (87.5 kg)  05/07/21 192 lb  4 oz (87.2 kg)   Physical Exam Vitals and nursing note reviewed.  Constitutional:      General: She is not in acute distress.    Appearance: She is well-developed.  HENT:     Head: Normocephalic and atraumatic.     Right Ear: Hearing, tympanic membrane, ear canal and external ear normal.     Left Ear: Hearing, tympanic membrane, ear canal and external ear normal.     Mouth/Throat:     Mouth: Mucous membranes are moist.     Pharynx: Oropharynx is clear. Uvula midline.  Eyes:     Extraocular Movements: Extraocular movements intact.     Conjunctiva/sclera: Conjunctivae normal.     Pupils: Pupils are equal, round, and reactive to light.  Neck:     Thyroid: No thyromegaly.     Trachea: No tracheal deviation.  Cardiovascular:     Rate and Rhythm: Normal rate and regular rhythm.     Pulses:          Dorsalis pedis pulses are 2+ on the right side and 2+ on the left side.     Heart sounds: No murmur heard. Pulmonary:     Effort: Pulmonary effort is normal. No respiratory distress.     Breath sounds: Normal breath sounds.  Abdominal:     Palpations: Abdomen is soft. There is no hepatomegaly or mass.     Tenderness: There is no abdominal tenderness.  Genitourinary:    Comments: Deferred to  gyn. Musculoskeletal:     Comments: No major deformity or signs of synovitis appreciated.  Lymphadenopathy:     Cervical: No cervical adenopathy.     Upper Body:     Right upper body: No supraclavicular adenopathy.     Left upper body: No supraclavicular adenopathy.  Skin:    General: Skin is warm.     Findings: No erythema or rash.  Neurological:     General: No focal deficit present.     Mental Status: She is alert and oriented to person, place, and time.     Cranial Nerves: No cranial nerve deficit.     Coordination: Coordination normal.     Gait: Gait normal.     Deep Tendon Reflexes:     Reflex Scores:      Bicep reflexes are 2+ on the right side and 2+ on the left side.      Patellar reflexes are 2+ on the right side and 2+ on the left side. Psychiatric:        Speech: Speech normal.     Comments: Well groomed, good eye contact.   ASSESSMENT AND PLAN:  Judith Moore was here today annual physical examination.  Orders Placed This Encounter  Procedures   Basic metabolic panel   Hemoglobin A1c   Lipid panel   VITAMIN D 25 Hydroxy (Vit-D Deficiency, Fractures)   Lab Results  Component Value Date   HGBA1C 5.7 08/06/2021   Lab Results  Component Value Date   CREATININE 0.89 08/06/2021   BUN 8 08/06/2021   NA 139 08/06/2021   K 4.0 08/06/2021   CL 107 08/06/2021   CO2 26 08/06/2021   Lab Results  Component Value Date   CHOL 177 08/06/2021   HDL 60.80 08/06/2021   LDLCALC 102 (H) 08/06/2021   TRIG 69.0 08/06/2021   CHOLHDL 3 08/06/2021   Routine general medical examination at a health care facility We discussed the importance of regular physical activity and healthy diet for  prevention of chronic illness and/or complications. Preventive guidelines reviewed. Vaccination up-to-date. Continue her female preventive care with her gynecologist. Next CPE in a year.  Screening for lipoid disorders -     Lipid panel  Screening for endocrine, metabolic  and immunity disorder -     Hemoglobin A1c -     Basic metabolic panel  Class 1 obesity with body mass index (BMI) of 31.0 to 31.9 in adult I do not recommend phentermine , at least while her hyperthyroidism is not well controlled. She will let me know if she wants to go to wt loss clinic. She undestands benefits of wt loss as well as adverse effects of obesity. Consistency with healthy diet and physical activity encouraged.   Anxiety disorder She would like to continue nonpharmacologic treatment. Regular exercise has helped some. CBT may also help.  Vitamin D deficiency, unspecified Further recommendation will be given according to 25 OH vitamin D result.  Return in 1 year (on 08/06/2022) for cpe.  Camillo Quadros G. [place], MD  [facility]. [place] office.

## 2021-08-06 ENCOUNTER — Ambulatory Visit (INDEPENDENT_AMBULATORY_CARE_PROVIDER_SITE_OTHER): Payer: No Typology Code available for payment source | Admitting: Family Medicine

## 2021-08-06 ENCOUNTER — Other Ambulatory Visit: Payer: Self-pay

## 2021-08-06 ENCOUNTER — Encounter: Payer: Self-pay | Admitting: Family Medicine

## 2021-08-06 VITALS — BP 100/70 | HR 77 | Resp 16 | Ht 66.0 in | Wt 196.0 lb

## 2021-08-06 DIAGNOSIS — F419 Anxiety disorder, unspecified: Secondary | ICD-10-CM

## 2021-08-06 DIAGNOSIS — Z6831 Body mass index (BMI) 31.0-31.9, adult: Secondary | ICD-10-CM

## 2021-08-06 DIAGNOSIS — Z13220 Encounter for screening for lipoid disorders: Secondary | ICD-10-CM | POA: Diagnosis not present

## 2021-08-06 DIAGNOSIS — Z1329 Encounter for screening for other suspected endocrine disorder: Secondary | ICD-10-CM | POA: Diagnosis not present

## 2021-08-06 DIAGNOSIS — E669 Obesity, unspecified: Secondary | ICD-10-CM

## 2021-08-06 DIAGNOSIS — Z130 Encounter for screening for diseases of the blood and blood-forming organs and certain disorders involving the immune mechanism: Secondary | ICD-10-CM

## 2021-08-06 DIAGNOSIS — E559 Vitamin D deficiency, unspecified: Secondary | ICD-10-CM | POA: Diagnosis not present

## 2021-08-06 DIAGNOSIS — Z0000 Encounter for general adult medical examination without abnormal findings: Secondary | ICD-10-CM | POA: Diagnosis not present

## 2021-08-06 DIAGNOSIS — Z13228 Encounter for screening for other metabolic disorders: Secondary | ICD-10-CM

## 2021-08-06 LAB — LIPID PANEL
Cholesterol: 177 mg/dL (ref 0–200)
HDL: 60.8 mg/dL (ref >39.00)
LDL Cholesterol: 102 mg/dL — ABNORMAL HIGH (ref 0–99)
NonHDL: 116.14
Total CHOL/HDL Ratio: 3
Triglycerides: 69 mg/dL (ref 0.0–149.0)
VLDL: 13.8 mg/dL (ref 0.0–40.0)

## 2021-08-06 LAB — BASIC METABOLIC PANEL
BUN: 8 mg/dL (ref 6–23)
CO2: 26 mEq/L (ref 19–32)
Calcium: 9.1 mg/dL (ref 8.4–10.5)
Chloride: 107 mEq/L (ref 96–112)
Creatinine, Ser: 0.89 mg/dL (ref 0.40–1.20)
GFR: 83.6 mL/min (ref >60.00)
Glucose, Bld: 88 mg/dL (ref 70–99)
Potassium: 4 mEq/L (ref 3.5–5.1)
Sodium: 139 mEq/L (ref 135–145)

## 2021-08-06 LAB — VITAMIN D 25 HYDROXY (VIT D DEFICIENCY, FRACTURES): VITD: 29.95 ng/mL — ABNORMAL LOW (ref 30.00–100.00)

## 2021-08-06 LAB — HEMOGLOBIN A1C: Hgb A1c MFr Bld: 5.7 % (ref 4.6–6.5)

## 2021-08-06 MED ORDER — VITAMIN D (ERGOCALCIFEROL) 1.25 MG (50000 UNIT) PO CAPS: 50000.0000 [IU] | ORAL_CAPSULE | ORAL | 1 refills | Status: DC

## 2021-08-06 NOTE — Patient Instructions (Addendum)
A few things to remember from today's visit:  Routine general medical examination at a health care facility  Vitamin D deficiency, unspecified - Plan: VITAMIN D 25 Hydroxy (Vit-D Deficiency, Fractures)  Screening for lipoid disorders - Plan: Lipid panel  Screening for endocrine, metabolic and immunity disorder - Plan: Basic metabolic panel, Hemoglobin A1c  Do not use My Chart to request refills or for acute issues that need immediate attention.   Please be sure medication list is accurate. If a new problem present, please set up appointment sooner than planned today. Today you have you routine preventive visit.  At least 150 minutes of moderate exercise per week, daily brisk walking for 15-30 min is a good exercise option. Healthy diet low in saturated (animal) fats and sweets and consisting of fresh fruits and vegetables, lean meats such as fish and white chicken and whole grains.  These are some of recommendations for screening depending of age and risk factors:  - Vaccines:  Tdap vaccine every 10 years.  Shingles vaccine recommended at age 34, could be given after 36 years of age but not sure about insurance coverage.   Pneumonia vaccines: Pneumovax at 65. Sometimes Pneumovax is giving earlier if history of smoking, lung disease,diabetes,kidney disease among some.  Screening for diabetes at age 27 and every 3 years.  Cervical cancer prevention:  Pap smear starts at 36 years of age and continues periodically until 36 years old in low risk women. Pap smear every 3 years between 34 and 58 years old. Pap smear every 3-5 years between women 30 and older if pap smear negative and HPV screening negative.  -Breast cancer: Mammogram: There is disagreement between experts about when to start screening in low risk asymptomatic female but recent recommendations are to start screening at 68 and not later than 36 years old , every 1-2 years and after 36 yo q 2 years. Screening is  recommended until 36 years old but some women can continue screening depending of healthy issues.  Colon cancer screening: Has been recently changed to 36 yo. Insurance may not cover until you are 36 years old. Screening is recommended until 36 years old.  Cholesterol disorder screening at age 29 and every 3 years.  Also recommended:  Dental visit- Brush and floss your teeth twice daily; visit your dentist twice a year. Eye doctor- Get an eye exam at least every 2 years. Helmet use- Always wear a helmet when riding a bicycle, motorcycle, rollerblading or skateboarding. Safe sex- If you may be exposed to sexually transmitted infections, use a condom. Seat belts- Seat belts can save your live; always wear one. Smoke/Carbon Monoxide detectors- These detectors need to be installed on the appropriate level of your home. Replace batteries at least once a year. Skin cancer- When out in the sun please cover up and use sunscreen 15 SPF or higher. Violence- If anyone is threatening or hurting you, please tell your healthcare provider.  Drink alcohol in moderation- Limit alcohol intake to one drink or less per day. Never drink and drive. Calcium supplementation 1000 to 1200 mg daily, ideally through your diet.  Vitamin D supplementation 800 units daily.   Why follow it? Research shows. Those who follow the Mediterranean diet have a reduced risk of heart disease  The diet is associated with a reduced incidence of Parkinson's and Alzheimer's diseases People following the diet may have longer life expectancies and lower rates of chronic diseases  The Dietary Guidelines for Americans recommends the Mediterranean diet  as an eating plan to promote health and prevent disease  What Is the Mediterranean Diet?  Healthy eating plan based on typical foods and recipes of Mediterranean-style cooking The diet is primarily a plant based diet; these foods should make up a majority of meals   Starches - Plant based  foods should make up a majority of meals - They are an important sources of vitamins, minerals, energy, antioxidants, and fiber - Choose whole grains, foods high in fiber and minimally processed items  - Typical grain sources include wheat, oats, barley, corn, brown rice, bulgar, farro, millet, polenta, couscous  - Various types of beans include chickpeas, lentils, fava beans, black beans, white beans   Fruits  Veggies - Large quantities of antioxidant rich fruits & veggies; 6 or more servings  - Vegetables can be eaten raw or lightly drizzled with oil and cooked  - Vegetables common to the traditional Mediterranean Diet include: artichokes, arugula, beets, broccoli, brussel sprouts, cabbage, carrots, celery, collard greens, cucumbers, eggplant, kale, leeks, lemons, lettuce, mushrooms, okra, onions, peas, peppers, potatoes, pumpkin, radishes, rutabaga, shallots, spinach, sweet potatoes, turnips, zucchini - Fruits common to the Mediterranean Diet include: apples, apricots, avocados, cherries, clementines, dates, figs, grapefruits, grapes, melons, nectarines, oranges, peaches, pears, pomegranates, strawberries, tangerines  Fats - Replace butter and margarine with healthy oils, such as olive oil, canola oil, and tahini  - Limit nuts to no more than a handful a day  - Nuts include walnuts, almonds, pecans, pistachios, pine nuts  - Limit or avoid candied, honey roasted or heavily salted nuts - Olives are central to the [employer] - can be eaten whole or used in a variety of dishes   Meats Protein - Limiting red meat: no more than a few times a month - When eating red meat: choose lean cuts and keep the portion to the size of deck of cards - Eggs: approx. 0 to 4 times a week  - Fish and lean poultry: at least 2 a week  - Healthy protein sources include, chicken, [place], lean beef, lamb - Increase intake of seafood such as tuna, salmon, trout, mackerel, shrimp, scallops - Avoid or limit high fat  processed meats such as sausage and bacon  Dairy - Include moderate amounts of low fat dairy products  - Focus on healthy dairy such as fat free yogurt, skim milk, low or reduced fat cheese - Limit dairy products higher in fat such as whole or 2% milk, cheese, ice cream  Alcohol - Moderate amounts of red wine is ok  - No more than 5 oz daily for women (all ages) and men older than age 61  - No more than 10 oz of wine daily for men younger than 13  Other - Limit sweets and other desserts  - Use herbs and spices instead of salt to flavor foods  - Herbs and spices common to the traditional Mediterranean Diet include: basil, bay leaves, chives, cloves, cumin, fennel, garlic, lavender, marjoram, mint, oregano, parsley, pepper, rosemary, sage, savory, sumac, tarragon, thyme   It's not just a diet, it's a lifestyle:  The Mediterranean diet includes lifestyle factors typical of those in the region  Foods, drinks and meals are best eaten with others and savored Daily physical activity is important for overall good health This could be strenuous exercise like running and aerobics This could also be more leisurely activities such as walking, housework, yard-work, or taking the stairs Moderation is the key; a balanced and healthy diet  accommodates most foods and drinks Consider portion sizes and frequency of consumption of certain foods   Meal Ideas & Options:  Breakfast:  Whole wheat toast or whole wheat English muffins with peanut butter & hard boiled egg Steel cut oats topped with apples & cinnamon and skim milk  Fresh fruit: banana, strawberries, melon, berries, peaches  Smoothies: strawberries, bananas, greek yogurt, peanut butter Low fat greek yogurt with blueberries and granola  Egg white omelet with spinach and mushrooms Breakfast couscous: whole wheat couscous, apricots, skim milk, cranberries  Sandwiches:  Hummus and grilled vegetables (peppers, zucchini, squash) on whole wheat bread    Grilled chicken on whole wheat pita with lettuce, tomatoes, cucumbers or tzatziki  [place] salad on whole wheat bread: tuna salad made with greek yogurt, olives, red peppers, capers, green onions Garlic rosemary lamb pita: lamb sauted with garlic, rosemary, salt & pepper; add lettuce, cucumber, greek yogurt to pita - flavor with lemon juice and black pepper  Seafood:  Mediterranean grilled salmon, seasoned with garlic, basil, parsley, lemon juice and black pepper Shrimp, lemon, and spinach whole-grain pasta salad made with low fat greek yogurt  Seared scallops with lemon orzo  Seared tuna steaks seasoned salt, pepper, coriander topped with tomato mixture of olives, tomatoes, olive oil, minced garlic, parsley, green onions and cappers  Meats:  Herbed greek chicken salad with kalamata olives, cucumber, feta  Red bell peppers stuffed with spinach, bulgur, lean ground beef (or lentils) & topped with feta   Kebabs: skewers of chicken, tomatoes, onions, zucchini, squash  [place] burgers: made with red onions, mint, dill, lemon juice, feta cheese topped with roasted red peppers Vegetarian Cucumber salad: cucumbers, artichoke hearts, celery, red onion, feta cheese, tossed in olive oil & lemon juice  Hummus and whole grain pita points with a greek salad (lettuce, tomato, feta, olives, cucumbers, red onion) Lentil soup with celery, carrots made with vegetable broth, garlic, salt and pepper  Tabouli salad: parsley, bulgur, mint, scallions, cucumbers, tomato, radishes, lemon juice, olive oil, salt and pepper.

## 2021-08-06 NOTE — Assessment & Plan Note (Signed)
Further recommendation will be given according to 25 OH vitamin D result. 

## 2021-08-06 NOTE — Assessment & Plan Note (Signed)
She would like to continue nonpharmacologic treatment. Regular exercise has helped some. CBT may also help.

## 2021-08-06 NOTE — Assessment & Plan Note (Signed)
I do not recommend phentermine , at least while her hyperthyroidism is not well controlled. She will let me know if she wants to go to wt loss clinic. She undestands benefits of wt loss as well as adverse effects of obesity. Consistency with healthy diet and physical activity encouraged.

## 2021-08-10 IMAGING — US US ABDOMEN LIMITED
1 series · 14 of 25 positions shown · non-contrast
Comparison: CT abdomen and pelvis January 06, 2016

CLINICAL DATA: Upper abdominal pain

EXAM:
ULTRASOUND ABDOMEN LIMITED RIGHT UPPER QUADRANT

[Series 1: us abdomen limited · 0.17mm/px · 14 of 47 slices shown]
[im 1/47]
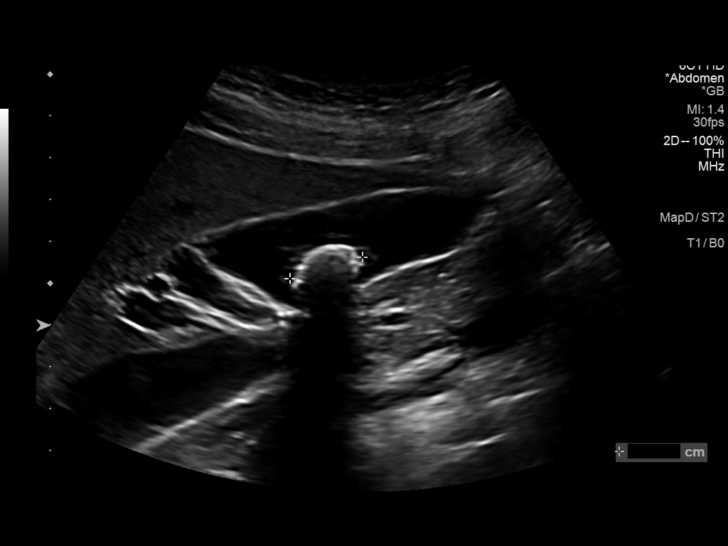
[im 4/47]
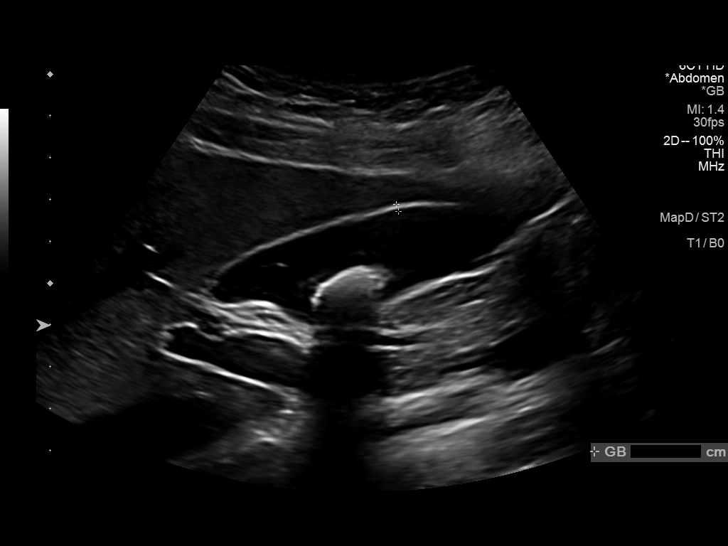
[im 8/47]
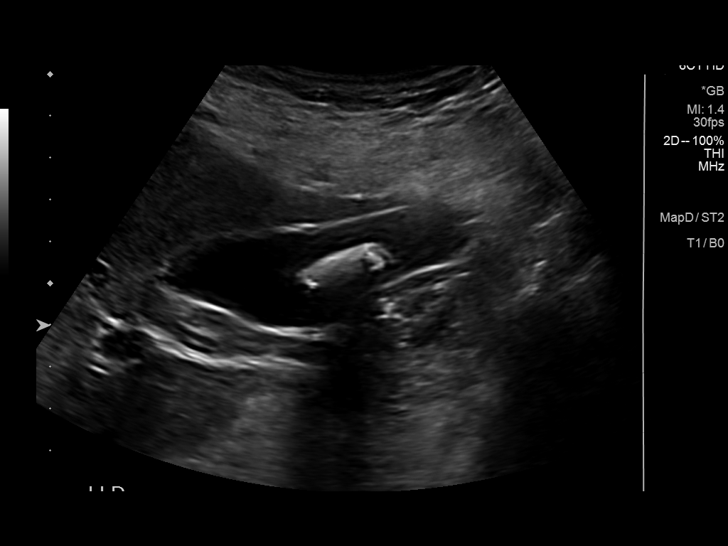
[im 12/47]
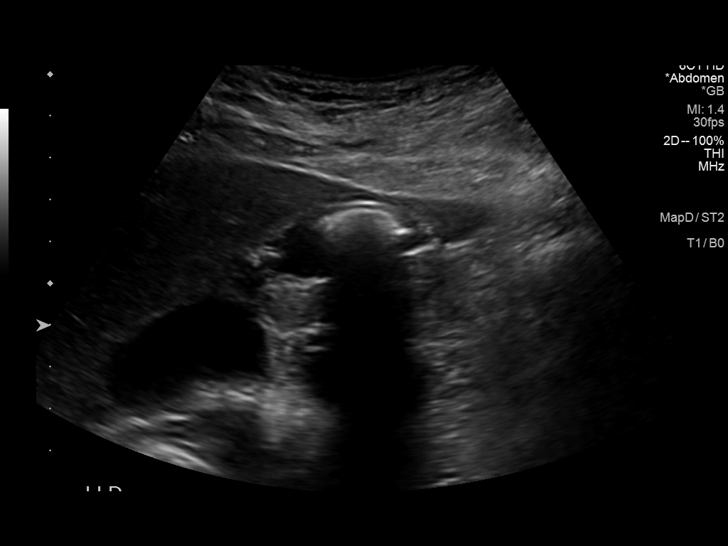
[im 16/47]
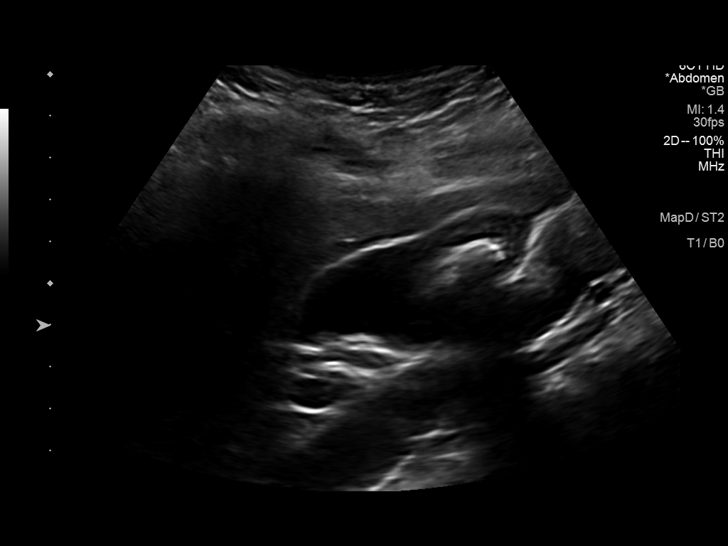
[im 18/47]
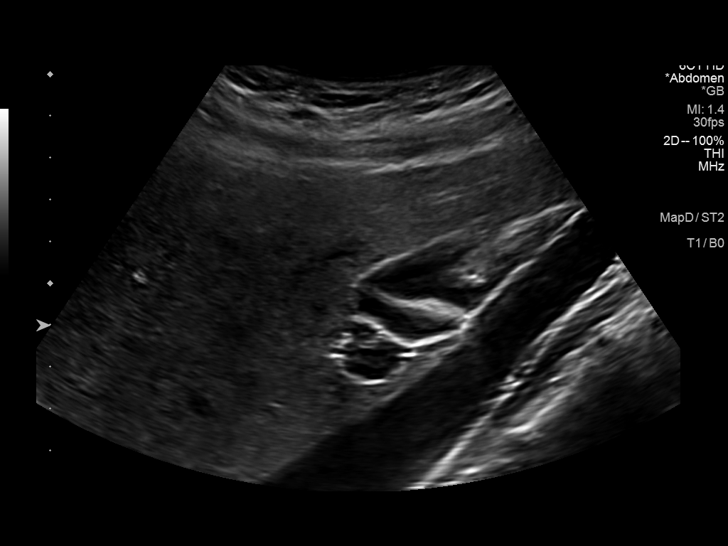
[im 22/47]
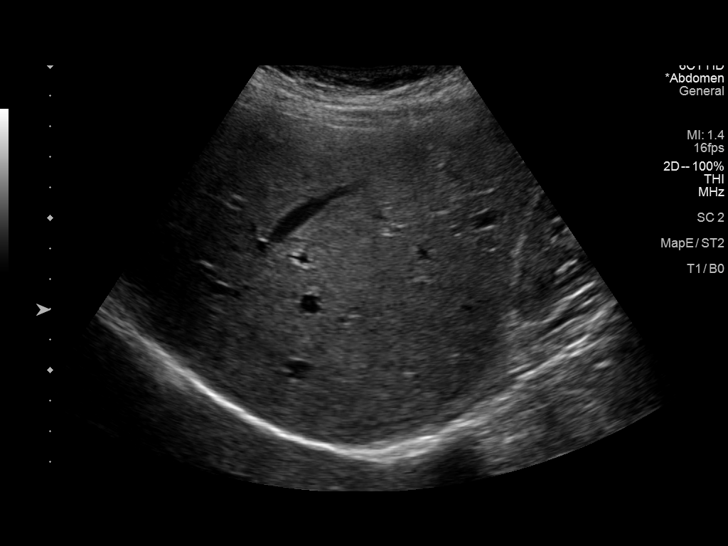
[im 25/47]
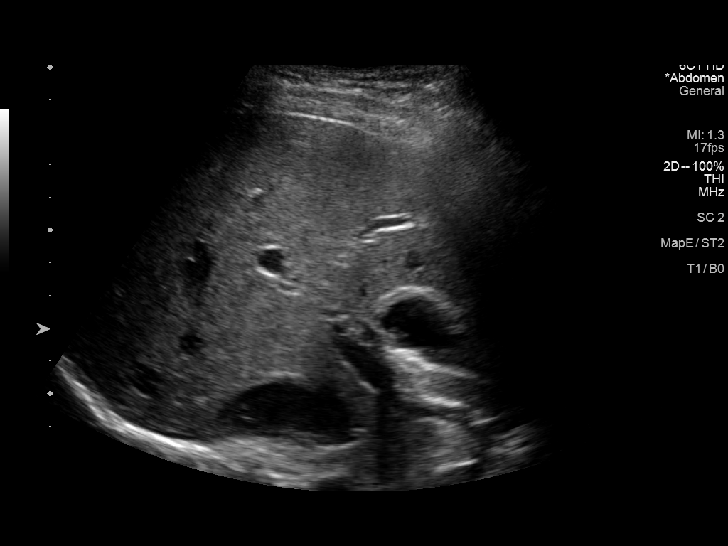
[im 29/47]
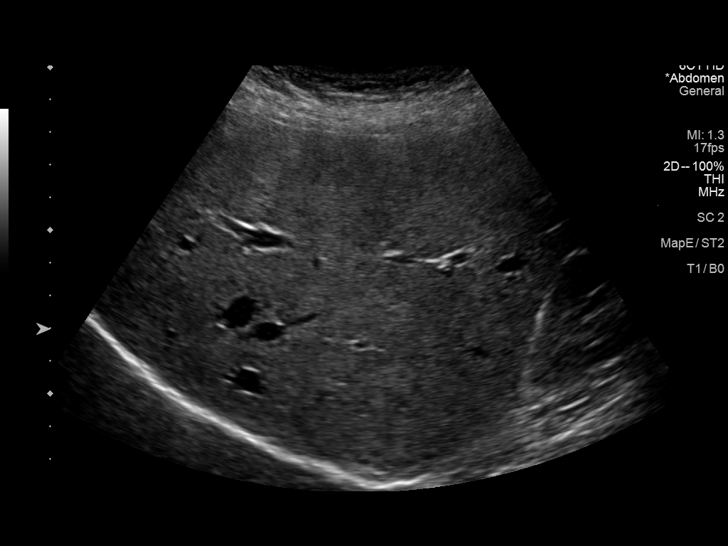
[im 31/47]
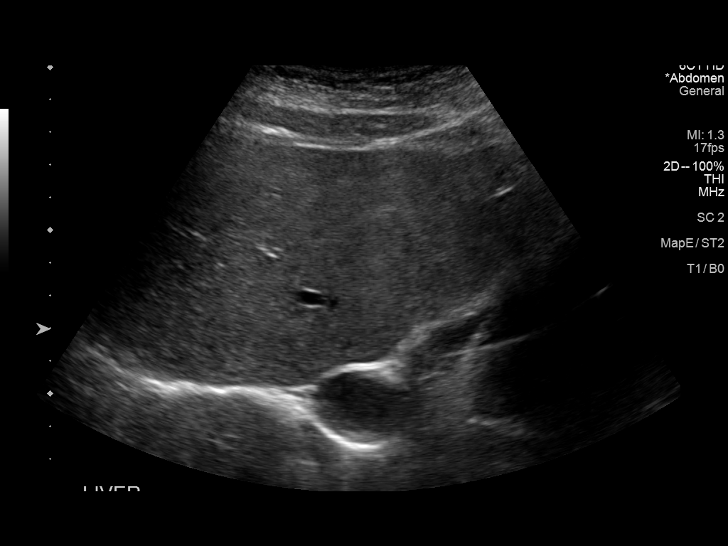
[im 35/47]
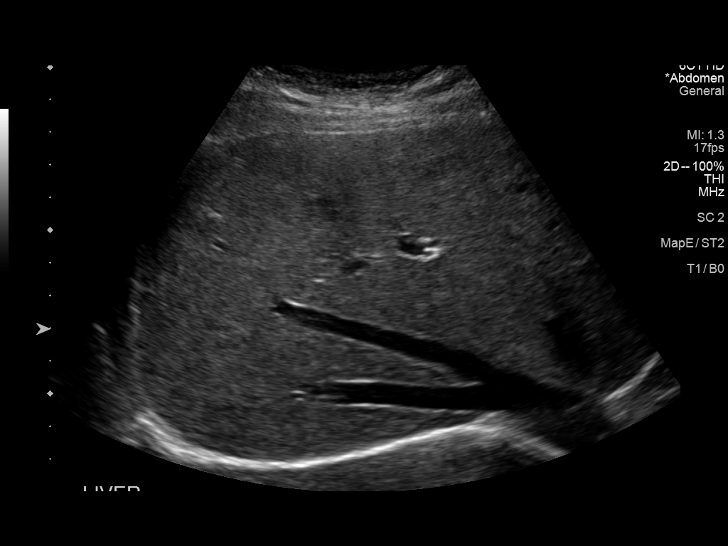
[im 39/47]
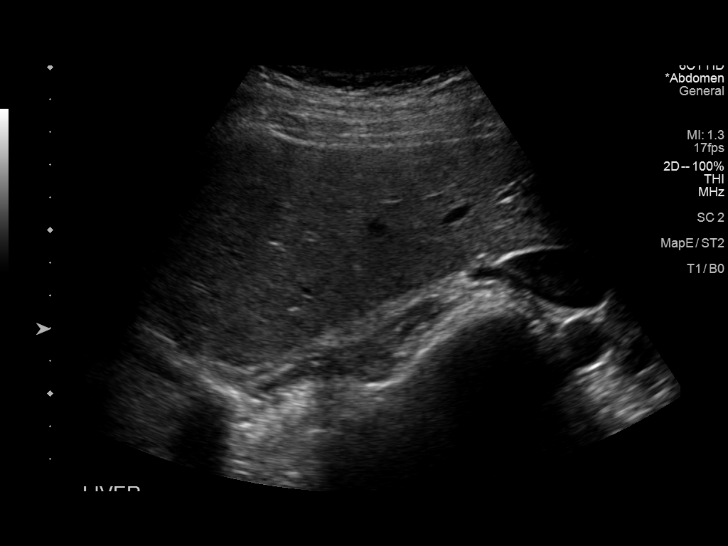
[im 43/47]
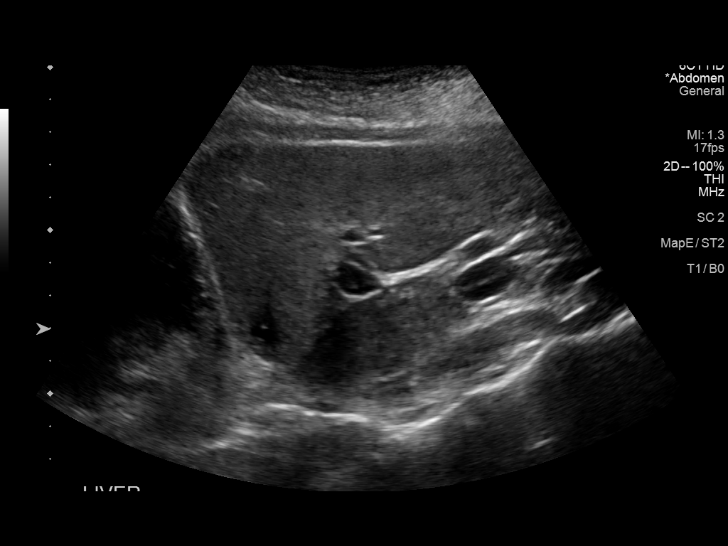
[im 47/47]
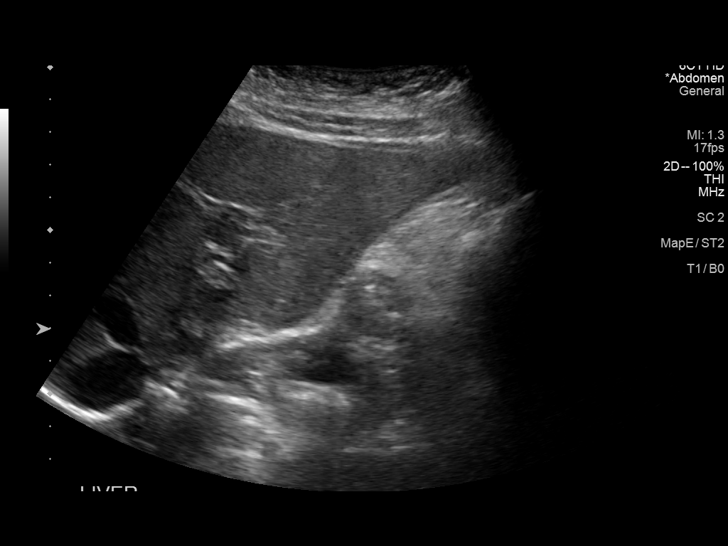

[14 of 25 positions shown; findings below may reference images not displayed]

FINDINGS: Gallbladder:

Within the gallbladder, there is a 1.8 cm echogenic focus which
moves in shadows consistent with cholelithiasis. No evident
gallbladder wall thickening or pericholecystic fluid. No sonographic
Murphy sign noted by sonographer.

Common bile duct:

Diameter: 3 mm. No intrahepatic or extrahepatic biliary duct
dilatation.

Liver:

No focal lesion identified. Within normal limits in parenchymal
echogenicity. Portal vein is patent on color Doppler imaging with
normal direction of blood flow towards the liver.

Other: None.
IMPRESSION: Cholelithiasis. No gallbladder wall thickening or pericholecystic
fluid.

Study otherwise unremarkable.

## 2021-08-11 ENCOUNTER — Other Ambulatory Visit: Payer: Self-pay

## 2021-08-11 ENCOUNTER — Ambulatory Visit (INDEPENDENT_AMBULATORY_CARE_PROVIDER_SITE_OTHER): Payer: No Typology Code available for payment source | Admitting: Endocrinology

## 2021-08-11 VITALS — BP 130/80 | HR 83 | Ht 66.0 in | Wt 195.8 lb

## 2021-08-11 DIAGNOSIS — E0590 Thyrotoxicosis, unspecified without thyrotoxic crisis or storm: Secondary | ICD-10-CM

## 2021-08-11 LAB — TSH: TSH: 0.42 u[IU]/mL (ref 0.35–5.50)

## 2021-08-11 LAB — T4, FREE: Free T4: 0.97 ng/dL (ref 0.60–1.60)

## 2021-08-11 MED ORDER — METHIMAZOLE 10 MG PO TABS: 10.0000 mg | ORAL_TABLET | Freq: Every day | ORAL | 3 refills | Status: DC

## 2021-08-11 MED ORDER — METHIMAZOLE 5 MG PO TABS: 5.0000 mg | ORAL_TABLET | Freq: Three times a day (TID) | ORAL | 3 refills | Status: DC

## 2021-08-11 NOTE — Patient Instructions (Addendum)
Blood tests are requested for you today.  We'll let you know about the results.   It is very unlikely that the methimazole is causing nausea, so you should take it.  If ever you have fever while taking methimazole, stop it and call us, even if the reason is obvious, because of the risk of a rare side-effect.  It is best to never miss the medication.  However, if you do miss it, next best is to double up the next time.   Please come back for a follow-up appointment in 1 month.

## 2021-08-11 NOTE — Progress Notes (Signed)
Subjective:    Patient ID: Judith Moore, female    DOB: Mar 04, 1986, 35 y.o.   MRN: [id number]  HPI Pt returns for f/u of hyperthyroidism (dx'ed 2011; she took tapazole 2016-2018;  she has never had thyroid imaging; she she she is not at risk for pregnancy; she has been off tapazole since mid-2018, due to normalization of TFT, but this is her rx of choice).  Pt says she take the methimazole intermittently.  She reports anxiety.   Past Medical History:  Diagnosis Date   Allergies    Anxiety    Gallstones    Hyperthyroidism     Past Surgical History:  Procedure Laterality Date   ADENOIDECTOMY     CESAREAN SECTION  09/28/2005, 11/28/2007   LAPAROSCOPIC APPENDECTOMY N/A 01/06/2016   Procedure: APPENDECTOMY LAPAROSCOPIC;  Surgeon: Luretha Murphy, MD;  Location: [facility];  Service: General;  Laterality: N/A;   stomach banding     TONSILLECTOMY      Social History   Socioeconomic History   Marital status: Divorced    Spouse name: SEPARATED   Number of children: 2   Years of education: 15+   Highest education level: Not on file  Occupational History   Occupation: HEALTH CARE BILLING SPECIALIST    Employer: [employer]  Tobacco Use   Smoking status: Never   Smokeless tobacco: Never  Vaping Use   Vaping Use: Never used  Substance and Sexual Activity   Alcohol use: Yes    Comment: occasional   Drug use: Not Currently    Types: Marijuana   Sexual activity: Yes    Partners: Male    Birth control/protection: I.U.D.    Comment: Mirena  Other Topics Concern   Not on file  Social History Narrative   Lives with her two sons, her uncle and cousin. Separated from husband 11/2010.   Social Determinants of Health   Financial Resource Strain: Not on file  Food Insecurity: Not on file  Transportation Needs: Not on file  Physical Activity: Not on file  Stress: Not on file  Social Connections: Not on file  Intimate Partner Violence: Not on file    Current Outpatient Medications  on File Prior to Visit  Medication Sig Dispense Refill   ALTAVERA 0.15-30 MG-MCG tablet TAKE 1 TABLET BY MOUTH EVERY DAY 84 tablet 3   alum & mag hydroxide-simeth (MYLANTA) 200-200-20 MG/5ML suspension Take 15 mLs by mouth every 6 (six) hours as needed for indigestion or heartburn. 355 mL 0   cetirizine (ZYRTEC ALLERGY) 10 MG tablet Take 1 tablet (10 mg total) by mouth daily. 30 tablet 11   Cyanocobalamin (VITAMIN B-12 CR PO) Take by mouth daily.     ferrous sulfate 325 (65 FE) MG tablet Take 325 mg by mouth daily with breakfast.     fluocinonide gel (LIDEX) 0.05 % Apply 1 application topically 2 (two) times daily. 60 g 0   fluticasone (FLONASE) 50 MCG/ACT nasal spray Place 1 spray into both nostrils daily. (Patient taking differently: Place 1 spray into both nostrils daily as needed.) 16 g PRN   hydrOXYzine (ATARAX/VISTARIL) 25 MG tablet Take 1 tablet (25 mg total) by mouth 3 (three) times daily as needed for anxiety. 30 tablet 1   ketoconazole (NIZORAL) 2 % shampoo APPLY 1 APPLICATION TOPICALLY 2 (TWO) TIMES A WEEK. 120 mL 0   Multiple Vitamin (MULTIVITAMIN) tablet Take 1 tablet by mouth daily.     omeprazole (PRILOSEC) 40 MG capsule Take 1 capsule (40  mg total) by mouth daily. 30 capsule 11   senna (SENOKOT) 8.6 MG tablet Take 1 tablet by mouth daily as needed for constipation.     Vitamin D, Ergocalciferol, (DRISDOL) 1.25 MG (50000 UNIT) CAPS capsule Take 1 capsule (50,000 Units total) by mouth every 14 (fourteen) days. 6 capsule 1   No current facility-administered medications on file prior to visit.    Allergies  Allergen Reactions   Almond (Diagnostic) Hives and Swelling    Almonds    Family History  Problem Relation Age of Onset   Hypertension Mother    Thyroid disease Mother        multinodular goiter   Diabetes Maternal Uncle    Healthy Brother        x1   Hypertension Maternal Grandmother    Hypertension Maternal Grandfather    Cancer Neg Hx    Heart disease Neg Hx     Colon cancer Neg Hx     BP 130/80 (BP Location: Right Arm, Patient Position: Sitting, Cuff Size: Large)   Pulse 83   Ht 5\' 6"  (1.676 m)   Wt 195 lb 12.8 oz (88.8 kg)   LMP 07/16/2021   SpO2 95%   BMI 31.60 kg/m    Review of Systems Denies fever.     Objective:   Physical Exam NECK: thyroid is slightly and diffusely enlarged.     Lab Results  Component Value Date   TSH 0.42 08/11/2021      Assessment & Plan:  Hyperthyroidism: well-controlled Noncompliance with insulin.  Due to this, we'll reduce methimazole to 5 mg qd.

## 2021-08-12 ENCOUNTER — Encounter: Payer: Self-pay | Admitting: Endocrinology

## 2021-08-12 MED ORDER — METHIMAZOLE 5 MG PO TABS: 5.0000 mg | ORAL_TABLET | Freq: Every day | ORAL | 3 refills | Status: DC

## 2021-09-10 ENCOUNTER — Other Ambulatory Visit: Payer: Self-pay

## 2021-09-10 ENCOUNTER — Ambulatory Visit (INDEPENDENT_AMBULATORY_CARE_PROVIDER_SITE_OTHER): Payer: No Typology Code available for payment source | Admitting: Endocrinology

## 2021-09-10 VITALS — BP 132/84 | Ht 66.0 in | Wt 193.0 lb

## 2021-09-10 DIAGNOSIS — E0590 Thyrotoxicosis, unspecified without thyrotoxic crisis or storm: Secondary | ICD-10-CM | POA: Diagnosis not present

## 2021-09-10 LAB — T4, FREE: Free T4: 0.9 ng/dL (ref 0.60–1.60)

## 2021-09-10 LAB — TSH: TSH: 0.21 u[IU]/mL — ABNORMAL LOW (ref 0.35–5.50)

## 2021-09-10 NOTE — Patient Instructions (Addendum)
Blood tests are requested for you today.  We'll let you know about the results.  If ever you have fever while taking methimazole, stop it and call us, even if the reason is obvious, because of the risk of a rare side-effect. It is best to never miss the medication.  However, if you do miss it, next best is to double up the next time.   Please come back for a follow-up appointment in 2 months.  

## 2021-09-10 NOTE — Progress Notes (Signed)
Subjective:    Patient ID: Judith Moore, female    DOB: 1985-11-06, 35 y.o.   MRN: [id number]  HPI Pt returns for f/u of hyperthyroidism (dx'ed 2011; she took tapazole 2016-2018;  she has never had thyroid imaging; she says she is not at risk for pregnancy; she has been off tapazole since mid-2018, due to normalization of TFT, but then resumed in mid-2022, due to low TSH).  Pt says she sometimes the methimazole.  She reports anxiety, but she denies palpitations and tremor.   Past Medical History:  Diagnosis Date   Allergies    Anxiety    Gallstones    Hyperthyroidism     Past Surgical History:  Procedure Laterality Date   ADENOIDECTOMY     CESAREAN SECTION  09/28/2005, 11/28/2007   LAPAROSCOPIC APPENDECTOMY N/A 01/06/2016   Procedure: APPENDECTOMY LAPAROSCOPIC;  Surgeon: Luretha Murphy, MD;  Location: [facility];  Service: General;  Laterality: N/A;   stomach banding     TONSILLECTOMY      Social History   Socioeconomic History   Marital status: Divorced    Spouse name: SEPARATED   Number of children: 2   Years of education: 15+   Highest education level: Not on file  Occupational History   Occupation: HEALTH CARE BILLING SPECIALIST    Employer: [employer]  Tobacco Use   Smoking status: Never   Smokeless tobacco: Never  Vaping Use   Vaping Use: Never used  Substance and Sexual Activity   Alcohol use: Yes    Comment: occasional   Drug use: Not Currently    Types: Marijuana   Sexual activity: Yes    Partners: Male    Birth control/protection: I.U.D.    Comment: Mirena  Other Topics Concern   Not on file  Social History Narrative   Lives with her two sons, her uncle and cousin. Separated from husband 11/2010.   Social Determinants of Health   Financial Resource Strain: Not on file  Food Insecurity: Not on file  Transportation Needs: Not on file  Physical Activity: Not on file  Stress: Not on file  Social Connections: Not on file  Intimate Partner Violence: Not  on file    Current Outpatient Medications on File Prior to Visit  Medication Sig Dispense Refill   ALTAVERA 0.15-30 MG-MCG tablet TAKE 1 TABLET BY MOUTH EVERY DAY 84 tablet 3   alum & mag hydroxide-simeth (MYLANTA) 200-200-20 MG/5ML suspension Take 15 mLs by mouth every 6 (six) hours as needed for indigestion or heartburn. 355 mL 0   cetirizine (ZYRTEC ALLERGY) 10 MG tablet Take 1 tablet (10 mg total) by mouth daily. 30 tablet 11   Cyanocobalamin (VITAMIN B-12 CR PO) Take by mouth daily.     ferrous sulfate 325 (65 FE) MG tablet Take 325 mg by mouth daily with breakfast.     fluocinonide gel (LIDEX) 0.05 % Apply 1 application topically 2 (two) times daily. 60 g 0   fluticasone (FLONASE) 50 MCG/ACT nasal spray Place 1 spray into both nostrils daily. (Patient taking differently: Place 1 spray into both nostrils daily as needed.) 16 g PRN   hydrOXYzine (ATARAX/VISTARIL) 25 MG tablet Take 1 tablet (25 mg total) by mouth 3 (three) times daily as needed for anxiety. 30 tablet 1   ketoconazole (NIZORAL) 2 % shampoo APPLY 1 APPLICATION TOPICALLY 2 (TWO) TIMES A WEEK. 120 mL 0   methimazole (TAPAZOLE) 5 MG tablet Take 1 tablet (5 mg total) by mouth daily. 90 tablet  3   Multiple Vitamin (MULTIVITAMIN) tablet Take 1 tablet by mouth daily.     omeprazole (PRILOSEC) 40 MG capsule Take 1 capsule (40 mg total) by mouth daily. 30 capsule 11   senna (SENOKOT) 8.6 MG tablet Take 1 tablet by mouth daily as needed for constipation.     Vitamin D, Ergocalciferol, (DRISDOL) 1.25 MG (50000 UNIT) CAPS capsule Take 1 capsule (50,000 Units total) by mouth every 14 (fourteen) days. 6 capsule 1   No current facility-administered medications on file prior to visit.    Allergies  Allergen Reactions   Almond (Diagnostic) Hives and Swelling    Almonds    Family History  Problem Relation Age of Onset   Hypertension Mother    Thyroid disease Mother        multinodular goiter   Diabetes Maternal Uncle    Healthy  Brother        x1   Hypertension Maternal Grandmother    Hypertension Maternal Grandfather    Cancer Neg Hx    Heart disease Neg Hx    Colon cancer Neg Hx     BP 132/84   Ht 5\' 6"  (1.676 m)   Wt 193 lb (87.5 kg)   BMI 31.15 kg/m    Review of Systems Denies fever    Objective:   Physical Exam NECK: thyroid is slightly and diffusely enlarged   Lab Results  Component Value Date   TSH 0.21 (L) 09/10/2021      Assessment & Plan:  Hypothyroidism: overcontrolled.   med noncompliance: I advised pt to continue the same methimazole

## 2021-11-07 ENCOUNTER — Other Ambulatory Visit: Payer: Self-pay | Admitting: Endocrinology

## 2021-11-12 ENCOUNTER — Ambulatory Visit: Payer: No Typology Code available for payment source | Admitting: Endocrinology

## 2021-11-12 ENCOUNTER — Other Ambulatory Visit: Payer: Self-pay

## 2021-11-12 VITALS — BP 128/78 | HR 71 | Ht 66.0 in | Wt 190.6 lb

## 2021-11-12 DIAGNOSIS — E0590 Thyrotoxicosis, unspecified without thyrotoxic crisis or storm: Secondary | ICD-10-CM

## 2021-11-12 LAB — T4, FREE: Free T4: 0.96 ng/dL (ref 0.60–1.60)

## 2021-11-12 LAB — TSH: TSH: 0.32 u[IU]/mL — ABNORMAL LOW (ref 0.35–5.50)

## 2021-11-12 MED ORDER — METHIMAZOLE 5 MG PO TABS: 7.5000 mg | ORAL_TABLET | Freq: Every day | ORAL | 3 refills | Status: DC

## 2021-11-12 MED ORDER — METHIMAZOLE 5 MG PO TABS: 5.0000 mg | ORAL_TABLET | Freq: Every day | ORAL | 3 refills | Status: DC

## 2021-11-12 NOTE — Progress Notes (Signed)
Subjective:    Patient ID: Judith Moore, female    DOB: Jan 13, 1986, 36 y.o.   MRN: [id number]  HPI Pt returns for f/u of hyperthyroidism (dx'ed 2011; she took tapazole 2016-2018;  she has never had thyroid imaging; she says she is not at risk for pregnancy; she has been off tapazole since mid-2018, due to normalization of TFT, but then resumed in mid-2022, due to low TSH).  Pt says she never misses the methimazole.  she denies palpitations and tremor.  Anxiety is mild. She will have surgery on 12/22/21.   Past Medical History:  Diagnosis Date   Allergies    Anxiety    Gallstones    Hyperthyroidism     Past Surgical History:  Procedure Laterality Date   ADENOIDECTOMY     CESAREAN SECTION  09/28/2005, 11/28/2007   LAPAROSCOPIC APPENDECTOMY N/A 01/06/2016   Procedure: APPENDECTOMY LAPAROSCOPIC;  Surgeon: Luretha Murphy, MD;  Location: [facility];  Service: General;  Laterality: N/A;   stomach banding     TONSILLECTOMY      Social History   Socioeconomic History   Marital status: Divorced    Spouse name: SEPARATED   Number of children: 2   Years of education: 15+   Highest education level: Not on file  Occupational History   Occupation: HEALTH CARE BILLING SPECIALIST    Employer: [employer]  Tobacco Use   Smoking status: Never   Smokeless tobacco: Never  Vaping Use   Vaping Use: Never used  Substance and Sexual Activity   Alcohol use: Yes    Comment: occasional   Drug use: Not Currently    Types: Marijuana   Sexual activity: Yes    Partners: Male    Birth control/protection: I.U.D.    Comment: Mirena  Other Topics Concern   Not on file  Social History Narrative   Lives with her two sons, her uncle and cousin. Separated from husband 11/2010.   Social Determinants of Health   Financial Resource Strain: Not on file  Food Insecurity: Not on file  Transportation Needs: Not on file  Physical Activity: Not on file  Stress: Not on file  Social Connections: Not on file   Intimate Partner Violence: Not on file    Current Outpatient Medications on File Prior to Visit  Medication Sig Dispense Refill   ALTAVERA 0.15-30 MG-MCG tablet TAKE 1 TABLET BY MOUTH EVERY DAY 84 tablet 3   alum & mag hydroxide-simeth (MYLANTA) 200-200-20 MG/5ML suspension Take 15 mLs by mouth every 6 (six) hours as needed for indigestion or heartburn. 355 mL 0   cetirizine (ZYRTEC ALLERGY) 10 MG tablet Take 1 tablet (10 mg total) by mouth daily. 30 tablet 11   Cyanocobalamin (VITAMIN B-12 CR PO) Take by mouth daily.     ferrous sulfate 325 (65 FE) MG tablet Take 325 mg by mouth daily with breakfast.     fluocinonide gel (LIDEX) 0.05 % Apply 1 application topically 2 (two) times daily. 60 g 0   fluticasone (FLONASE) 50 MCG/ACT nasal spray Place 1 spray into both nostrils daily. (Patient taking differently: Place 1 spray into both nostrils daily as needed.) 16 g PRN   hydrOXYzine (ATARAX/VISTARIL) 25 MG tablet Take 1 tablet (25 mg total) by mouth 3 (three) times daily as needed for anxiety. 30 tablet 1   ketoconazole (NIZORAL) 2 % shampoo APPLY 1 APPLICATION TOPICALLY 2 (TWO) TIMES A WEEK. 120 mL 0   Multiple Vitamin (MULTIVITAMIN) tablet Take 1 tablet by mouth  daily.     omeprazole (PRILOSEC) 40 MG capsule Take 1 capsule (40 mg total) by mouth daily. 30 capsule 11   senna (SENOKOT) 8.6 MG tablet Take 1 tablet by mouth daily as needed for constipation.     Vitamin D, Ergocalciferol, (DRISDOL) 1.25 MG (50000 UNIT) CAPS capsule Take 1 capsule (50,000 Units total) by mouth every 14 (fourteen) days. 6 capsule 1   No current facility-administered medications on file prior to visit.    Allergies  Allergen Reactions   Almond (Diagnostic) Hives and Swelling    Almonds    Family History  Problem Relation Age of Onset   Hypertension Mother    Thyroid disease Mother        multinodular goiter   Diabetes Maternal Uncle    Healthy Brother        x1   Hypertension Maternal Grandmother     Hypertension Maternal Grandfather    Cancer Neg Hx    Heart disease Neg Hx    Colon cancer Neg Hx     BP 128/78    Pulse 71    Ht 5\' 6"  (1.676 m)    Wt 190 lb 9.6 oz (86.5 kg)    SpO2 98%    BMI 30.76 kg/m    Review of Systems Denies fever.      Objective:   Physical Exam VITAL SIGNS:  See vs page GENERAL: no distress NECK: thyroid is 3-5 times normal size.  diffuse   Lab Results  Component Value Date   TSH 0.32 (L) 11/12/2021      Assessment & Plan:  Hyperthyroidism: uncontrolled.  Increase tapazole to 1 1/2 pills per day

## 2021-11-12 NOTE — Patient Instructions (Signed)
Blood tests are requested for you today.  We'll let you know about the results.   °If ever you have fever while taking methimazole, stop it and call us, even if the reason is obvious, because of the risk of a rare side-effect.   °It is best to never miss the medication.  However, if you do miss it, next best is to double up the next time.   °Please come back for a follow-up appointment in 3 months.   °

## 2021-11-25 ENCOUNTER — Encounter: Payer: Self-pay | Admitting: Obstetrics

## 2021-11-25 ENCOUNTER — Other Ambulatory Visit: Payer: Self-pay

## 2021-11-25 ENCOUNTER — Other Ambulatory Visit (HOSPITAL_COMMUNITY)
Admission: RE | Admit: 2021-11-25 | Discharge: 2021-11-25 | Disposition: A | Discharge status: Home / Self Care | Payer: No Typology Code available for payment source | Source: Ambulatory Visit | Attending: Obstetrics | Admitting: Obstetrics

## 2021-11-25 ENCOUNTER — Ambulatory Visit (INDEPENDENT_AMBULATORY_CARE_PROVIDER_SITE_OTHER): Payer: No Typology Code available for payment source | Admitting: Obstetrics

## 2021-11-25 VITALS — BP 120/78 | HR 63 | Ht 66.0 in | Wt 193.3 lb

## 2021-11-25 DIAGNOSIS — Z01419 Encounter for gynecological examination (general) (routine) without abnormal findings: Secondary | ICD-10-CM | POA: Diagnosis present

## 2021-11-25 DIAGNOSIS — N898 Other specified noninflammatory disorders of vagina: Secondary | ICD-10-CM | POA: Insufficient documentation

## 2021-11-25 DIAGNOSIS — Z3009 Encounter for other general counseling and advice on contraception: Secondary | ICD-10-CM | POA: Diagnosis not present

## 2021-11-25 DIAGNOSIS — N946 Dysmenorrhea, unspecified: Secondary | ICD-10-CM | POA: Diagnosis not present

## 2021-11-25 MED ORDER — BALCOLTRA 0.1-20 MG-MCG(21) PO TABS: 1.0000 | ORAL_TABLET | Freq: Every day | ORAL | 3 refills | Status: DC

## 2021-11-25 MED ORDER — BALCOLTRA 0.1-20 MG-MCG(21) PO TABS: 1.0000 | ORAL_TABLET | Freq: Every day | ORAL | 11 refills | Status: DC

## 2021-11-25 MED ORDER — IBUPROFEN 800 MG PO TABS: 800.0000 mg | ORAL_TABLET | Freq: Three times a day (TID) | ORAL | 5 refills | Status: DC | PRN

## 2021-11-25 NOTE — Progress Notes (Signed)
Annual exam Wants to discuss contraception, on OCP's now, Mirena in the past.

## 2021-11-25 NOTE — Progress Notes (Signed)
HPI: [name] is a 36 y.o. female with hx of anxiety,vit D def,GERD,HLD,and hyperthyroidism here today for surgery clearance requested by Dr Judith Moore.  She is planning on having abdominoplasty and 360 liposuction on 12/22/21. 5 hours procedure and under general anesthesia.  S/P bariatric procedure in 06/2015 and cholecystectomy in 04/2021. No complications during or post op period after past surgical procedures. She is going to be on Oxycodone for post op pain management. She has arrangements for first week post op, a nurse will be helping her a few hours per day.  Negative for CP,palpitations,SOB,or dizziness when climbing a flight or stairs. She has been exercising at the gym and following a healthful diet. GERD on omeprazole 40 mg daily.  Hyperthyroidism on methimazole 5 mg 1/2 tablet daily.  She follows with endocrinologist. Lab Results  Component Value Date   TSH 0.32 (L) 11/12/2021   Iron deficiency anemia on ferrous sulfate 325 mg daily. Lab Results  Component Value Date   WBC 4.1 05/07/2021   HGB 11.3 (L) 05/07/2021   HCT 34.6 (L) 05/07/2021   MCV 83.4 05/07/2021   PLT 256.0 05/07/2021   Review of Systems  Constitutional:  Negative for activity change, appetite change, fatigue and fever.  HENT:  Negative for mouth sores, nosebleeds, sore throat and trouble swallowing.   Eyes:  Negative for redness and visual disturbance.  Respiratory:  Negative for cough and wheezing.   Cardiovascular:  Negative for leg swelling.  Gastrointestinal:  Negative for abdominal pain, nausea and vomiting.       Negative for changes in bowel habits.  Genitourinary:  Negative for decreased urine volume, difficulty urinating, dysuria and hematuria.  Musculoskeletal:  Negative for gait problem and myalgias.  Skin:  Negative for rash.  Neurological:  Negative for syncope, weakness and headaches.  Hematological:  Negative for adenopathy. Does not bruise/bleed easily.  Rest see  pertinent positives and negatives per HPI.  Current Outpatient Medications on File Prior to Visit  Medication Sig Dispense Refill   BALCOLTRA 0.1-20 MG-MCG(21) TABS Take 1 tablet by mouth daily. 84 tablet 3   cetirizine (ZYRTEC ALLERGY) 10 MG tablet Take 1 tablet (10 mg total) by mouth daily. 30 tablet 11   ferrous sulfate 325 (65 FE) MG tablet Take 325 mg by mouth daily with breakfast.     fluocinonide gel (LIDEX) 5.70 % Apply 1 application topically 2 (two) times daily. 60 g 0   fluticasone (FLONASE) 50 MCG/ACT nasal spray Place 1 spray into both nostrils daily. (Patient taking differently: Place 1 spray into both nostrils daily as needed.) 16 g PRN   ibuprofen (ADVIL) 800 MG tablet Take 1 tablet (800 mg total) by mouth every 8 (eight) hours as needed. 30 tablet 5   ketoconazole (NIZORAL) 2 % shampoo APPLY 1 APPLICATION TOPICALLY 2 (TWO) TIMES A WEEK. 120 mL 0   methimazole (TAPAZOLE) 5 MG tablet Take 1.5 tablets (7.5 mg total) by mouth daily. 135 tablet 3   Multiple Vitamin (MULTIVITAMIN) tablet Take 1 tablet by mouth daily.     omeprazole (PRILOSEC) 40 MG capsule Take 1 capsule (40 mg total) by mouth daily. 30 capsule 11   senna (SENOKOT) 8.6 MG tablet Take 1 tablet by mouth daily as needed for constipation.     Vitamin D, Ergocalciferol, (DRISDOL) 1.25 MG (50000 UNIT) CAPS capsule Take 1 capsule (50,000 Units total) by mouth every 14 (fourteen) days. 6 capsule 1   No current facility-administered medications on file prior to visit.  Past Medical History:  Diagnosis Date   Allergies    Anxiety    Gallstones    Hyperthyroidism    Allergies  Allergen Reactions   Almond (Diagnostic) Hives and Swelling    Almonds    Social History   Socioeconomic History   Marital status: Divorced    Spouse name: SEPARATED   Number of children: 2   Years of education: 15+   Highest education level: Not on file  Occupational History   Occupation: HEALTH CARE BILLING SPECIALIST    Employer:  [employer]  Tobacco Use   Smoking status: Never   Smokeless tobacco: Never  Vaping Use   Vaping Use: Never used  Substance and Sexual Activity   Alcohol use: Yes    Comment: occasional   Drug use: Yes    Types: Marijuana   Sexual activity: Yes    Partners: Male    Birth control/protection: Pill    Comment: Mirena  Other Topics Concern   Not on file  Social History Narrative   Lives with her two sons, her uncle and cousin. Separated from husband 11/2010.   Social Determinants of Health   Financial Resource Strain: Not on file  Food Insecurity: Not on file  Transportation Needs: Not on file  Physical Activity: Not on file  Stress: Not on file  Social Connections: Not on file   Vitals:   11/26/21 0725  BP: 120/70  Pulse: 72  Resp: 16   Body mass index is 31.15 kg/m.  Physical Exam Vitals and nursing note reviewed.  Constitutional:      General: She is not in acute distress.    Appearance: She is well-developed.  HENT:     Head: Normocephalic and atraumatic.     Mouth/Throat:     Mouth: Mucous membranes are moist.     Pharynx: Oropharynx is clear.  Eyes:     Conjunctiva/sclera: Conjunctivae normal.  Cardiovascular:     Rate and Rhythm: Normal rate and regular rhythm.     Pulses:          Dorsalis pedis pulses are 2+ on the right side and 2+ on the left side.     Heart sounds: No murmur heard. Pulmonary:     Effort: Pulmonary effort is normal. No respiratory distress.     Breath sounds: Normal breath sounds.  Abdominal:     Palpations: Abdomen is soft. There is no hepatomegaly or mass.     Tenderness: There is no abdominal tenderness.  Lymphadenopathy:     Cervical: No cervical adenopathy.  Skin:    General: Skin is warm.     Findings: No erythema or rash.  Neurological:     General: No focal deficit present.     Mental Status: She is alert and oriented to person, place, and time.     Cranial Nerves: No cranial nerve deficit.     Gait: Gait normal.   Psychiatric:     Comments: Well groomed, good eye contact.   ASSESSMENT AND PLAN:  [name] was seen today for surgery clearance.  Diagnoses and all orders for this visit: Orders Placed This Encounter  Procedures   Comprehensive metabolic panel   Hemoglobin A1c   CBC   Lab Results  Component Value Date   HGBA1C 5.4 11/26/2021   Lab Results  Component Value Date   CREATININE 0.99 11/26/2021   BUN 11 11/26/2021   NA 139 11/26/2021   K 4.1 11/26/2021   CL 106 11/26/2021  CO2 29 11/26/2021   Lab Results  Component Value Date   ALT 13 11/26/2021   AST 14 11/26/2021   ALKPHOS 40 11/26/2021   BILITOT 0.6 11/26/2021   Lab Results  Component Value Date   WBC 4.1 11/26/2021   HGB 11.4 (L) 11/26/2021   HCT 35.6 (L) 11/26/2021   MCV 84.5 11/26/2021   PLT 258.0 11/26/2021   Preop examination Based on her medical hx she has a low risk for complications. She has already discussed risk of procedure with plastic surgeon. DVT prophylaxis: Early ambulation. We discussed some side effects of opioids, she already has a prescription for oxycodone.  Adequate fiber and Aspirin intake as well as OTC stool softener to prevent constipation. Avoid OTC supplements for at least a week before procedure. No NSAIDs/Aspirin for at least 7 days before procedure. She was instructed to take her prescription medications the day before procedure and to resume the day after if no complications. Lab results and copy of today's visit will be faxed to Dr.Nyame's office.  Iron deficiency anemia, unspecified iron deficiency anemia type Mild. Continue ferrous sulfate 325 mg daily. Further recommendation will be given according to CBC results.  Hyperthyroidism Last TSH mildly abnormal. She has met with her endocrinologist on 11/12/21 and according the patient, recommendations in regard methimazole were already given.  Return if symptoms worsen or fail to improve.  Hildred Pharo G. [place], MD  [facility] [place] office.

## 2021-11-25 NOTE — Progress Notes (Signed)
Subjective:        Judith Moore is a 36 y.o. female here for a routine exam.  Current complaints: Periods are a little heavy on OCP.  Would like to possible change OCP's.  Personal health questionnaire:  Is patient [name], have a family history of breast and/or ovarian cancer: no Is there a family history of uterine cancer diagnosed at age < 11, gastrointestinal cancer, urinary tract cancer, family member who is a Field seismologist syndrome-associated carrier: no Is the patient overweight and hypertensive, family history of diabetes, personal history of gestational diabetes, preeclampsia or PCOS: no Is patient over 41, have PCOS,  family history of premature CHD under age 57, diabetes, smoke, have hypertension or peripheral artery disease:  no At any time, has a partner hit, kicked or otherwise hurt or frightened you?: no Over the past 2 weeks, have you felt down, depressed or hopeless?: no Over the past 2 weeks, have you felt little interest or pleasure in doing things?:no   Gynecologic History Patient's last menstrual period was 11/02/2021 (approximate). Contraception: OCP (estrogen/progesterone) Last Pap: 07-18-2020. Results were: normal Last mammogram: n/a. Results were: n/a  Obstetric History OB History  Gravida Para Term Preterm AB Living  2 2 1 1  0 2  SAB IAB Ectopic Multiple Live Births  0 0 0 0 2    # Outcome Date GA Lbr Len/2nd Weight Sex Delivery Anes PTL Lv  2 Preterm           1 Term             Past Medical History:  Diagnosis Date   Allergies    Anxiety    Gallstones    Hyperthyroidism     Past Surgical History:  Procedure Laterality Date   ADENOIDECTOMY     CESAREAN SECTION  09/28/2005, 11/28/2007   CHOLECYSTECTOMY  04/2021   LAPAROSCOPIC APPENDECTOMY N/A 01/06/2016   Procedure: APPENDECTOMY LAPAROSCOPIC;  Surgeon: Johnathan Hausen, MD;  Location: [facility];  Service: General;  Laterality: N/A;   stomach banding     TONSILLECTOMY       Current  Outpatient Medications:    ferrous sulfate 325 (65 FE) MG tablet, Take 325 mg by mouth daily with breakfast., Disp: , Rfl:    ibuprofen (ADVIL) 800 MG tablet, Take 1 tablet (800 mg total) by mouth every 8 (eight) hours as needed., Disp: 30 tablet, Rfl: 5   ketoconazole (NIZORAL) 2 % shampoo, APPLY 1 APPLICATION TOPICALLY 2 (TWO) TIMES A WEEK., Disp: 120 mL, Rfl: 0   methimazole (TAPAZOLE) 5 MG tablet, Take 1.5 tablets (7.5 mg total) by mouth daily., Disp: 135 tablet, Rfl: 3   Multiple Vitamin (MULTIVITAMIN) tablet, Take 1 tablet by mouth daily., Disp: , Rfl:    senna (SENOKOT) 8.6 MG tablet, Take 1 tablet by mouth daily as needed for constipation., Disp: , Rfl:    Vitamin D, Ergocalciferol, (DRISDOL) 1.25 MG (50000 UNIT) CAPS capsule, Take 1 capsule (50,000 Units total) by mouth every 14 (fourteen) days., Disp: 6 capsule, Rfl: 1   alum & mag hydroxide-simeth (MYLANTA) [id number] MG/5ML suspension, Take 15 mLs by mouth every 6 (six) hours as needed for indigestion or heartburn. (Patient not taking: Reported on 11/25/2021), Disp: 355 mL, Rfl: 0   BALCOLTRA 0.1-20 MG-MCG(21) TABS, Take 1 tablet by mouth daily., Disp: 84 tablet, Rfl: 3   cetirizine (ZYRTEC ALLERGY) 10 MG tablet, Take 1 tablet (10 mg total) by mouth daily., Disp: 30 tablet, Rfl: 11   Cyanocobalamin (  VITAMIN B-12 CR PO), Take by mouth daily. (Patient not taking: Reported on 11/25/2021), Disp: , Rfl:    fluocinonide gel (LIDEX) 0.05 %, Apply 1 application topically 2 (two) times daily., Disp: 60 g, Rfl: 0   fluticasone (FLONASE) 50 MCG/ACT nasal spray, Place 1 spray into both nostrils daily. (Patient taking differently: Place 1 spray into both nostrils daily as needed.), Disp: 16 g, Rfl: PRN   hydrOXYzine (ATARAX/VISTARIL) 25 MG tablet, Take 1 tablet (25 mg total) by mouth 3 (three) times daily as needed for anxiety. (Patient not taking: Reported on 11/25/2021), Disp: 30 tablet, Rfl: 1   omeprazole (PRILOSEC) 40 MG capsule, Take 1 capsule (40  mg total) by mouth daily., Disp: 30 capsule, Rfl: 11 Allergies  Allergen Reactions   Almond (Diagnostic) Hives and Swelling    Almonds    Social History   Tobacco Use   Smoking status: Never   Smokeless tobacco: Never  Substance Use Topics   Alcohol use: Yes    Comment: occasional    Family History  Problem Relation Age of Onset   Cancer Paternal Grandfather    Diabetes Paternal Grandmother    Hypertension Maternal Grandmother    Hypertension Maternal Grandfather    Kidney failure Maternal Grandfather    Hypertension Mother    Thyroid disease Mother        multinodular goiter   Healthy Brother        x1   Diabetes Maternal Uncle    Heart disease Neg Hx    Colon cancer Neg Hx       Review of Systems  Constitutional: negative for fatigue and weight loss Respiratory: negative for cough and wheezing Cardiovascular: negative for chest pain, fatigue and palpitations Gastrointestinal: negative for abdominal pain and change in bowel habits Musculoskeletal:negative for myalgias Neurological: negative for gait problems and tremors Behavioral/Psych: negative for abusive relationship, depression Endocrine: negative for temperature intolerance    Genitourinary: positive for vaginal discharge and heavy periods.  negative for genital lesions, hot flashes, sexual problems  Integument/breast: negative for breast lump, breast tenderness, nipple discharge and skin lesion(s)    Objective:       BP 120/78    Pulse 63    Ht 5\' 6"  (1.676 m)    Wt 193 lb 4.8 oz (87.7 kg)    LMP 11/02/2021 (Approximate)    BMI 31.20 kg/m  General:   Alert and no distress  Skin:   no rash or abnormalities  Lungs:   clear to auscultation bilaterally  Heart:   regular rate and rhythm, S1, S2 normal, no murmur, click, rub or gallop  Breasts:   normal without suspicious masses, skin or nipple changes or axillary nodes  Abdomen:  normal findings: no organomegaly, soft, non-tender and no hernia  Pelvis:   External genitalia: normal general appearance Urinary system: urethral meatus normal and bladder without fullness, nontender Vaginal: normal without tenderness, induration or masses Cervix: normal appearance Adnexa: normal bimanual exam Uterus: anteverted and non-tender, normal size   Lab Review Urine pregnancy test Labs reviewed yes Radiologic studies reviewed no  I have spent a total of 20 minutes of face-to-face time, excluding clinical staff time, reviewing notes and preparing to see patient, ordering tests and/or medications, and counseling the patient.   Assessment:    1. Encounter for gynecological examination with Papanicolaou smear of cervix Rx: - Cytology - PAP  2. Vaginal discharge Rx: - Cervicovaginal ancillary only( [place])  3. Encounter for other general counseling and  advice on contraception - OCP options discussed.  She requested a different OCP than [place].  Balcoltra recommended and agreed to.  Will re-evaluate in 3 months and if periods not improved with Balcoltra, will switch to a norethindrone pill  Rx: - BALCOLTRA 0.1-20 MG-MCG(21) TABS; Take 1 tablet by mouth daily.  Dispense: 28 tablet; Refill: 11  4. Dysmenorrhea Rx: - ibuprofen (ADVIL) 800 MG tablet; Take 1 tablet (800 mg total) by mouth every 8 (eight) hours as needed.  Dispense: 30 tablet; Refill: 5     Plan:    Education reviewed: calcium supplements, depression evaluation, low fat, low cholesterol diet, safe sex/STD prevention, self breast exams, and weight bearing exercise. Contraception: OCP (estrogen/progesterone). Follow up in: 3 months.   Meds ordered this encounter  Medications   ibuprofen (ADVIL) 800 MG tablet    Sig: Take 1 tablet (800 mg total) by mouth every 8 (eight) hours as needed.    Dispense:  30 tablet    Refill:  5   DISCONTD: BALCOLTRA 0.1-20 MG-MCG(21) TABS    Sig: Take 1 tablet by mouth daily.    Dispense:  28 tablet    Refill:  11   BALCOLTRA 0.1-20 MG-MCG(21)  TABS    Sig: Take 1 tablet by mouth daily.    Dispense:  84 tablet    Refill:  3     Shelly Bombard, MD 11/25/2021 3:45 PM

## 2021-11-26 ENCOUNTER — Ambulatory Visit (INDEPENDENT_AMBULATORY_CARE_PROVIDER_SITE_OTHER): Payer: No Typology Code available for payment source | Admitting: Family Medicine

## 2021-11-26 ENCOUNTER — Encounter: Payer: Self-pay | Admitting: Family Medicine

## 2021-11-26 VITALS — BP 120/70 | HR 72 | Resp 16 | Ht 66.0 in | Wt 193.0 lb

## 2021-11-26 DIAGNOSIS — D509 Iron deficiency anemia, unspecified: Secondary | ICD-10-CM

## 2021-11-26 DIAGNOSIS — Z01818 Encounter for other preprocedural examination: Secondary | ICD-10-CM

## 2021-11-26 DIAGNOSIS — E0590 Thyrotoxicosis, unspecified without thyrotoxic crisis or storm: Secondary | ICD-10-CM

## 2021-11-26 LAB — CBC
HCT: 35.6 % — ABNORMAL LOW (ref 36.0–46.0)
Hemoglobin: 11.4 g/dL — ABNORMAL LOW (ref 12.0–15.0)
MCHC: 32.1 g/dL (ref 30.0–36.0)
MCV: 84.5 fl (ref 78.0–100.0)
Platelets: 258 10*3/uL (ref 150.0–400.0)
RBC: 4.22 Mil/uL (ref 3.87–5.11)
RDW: 14.2 % (ref 11.5–15.5)
WBC: 4.1 10*3/uL (ref 4.0–10.5)

## 2021-11-26 LAB — HEMOGLOBIN A1C: Hgb A1c MFr Bld: 5.4 % (ref 4.6–6.5)

## 2021-11-26 NOTE — Patient Instructions (Signed)
A few things to remember from today's visit:  Preop examination - Plan: Comprehensive metabolic panel, Hemoglobin A1c, CBC  If you need refills please call your pharmacy. Do not use My Chart to request refills or for acute issues that need immediate attention.   Avoid over the counter supplements for at least a week before surgery. Take you prescription meds the day before and resume the day after surgery. No ibuprofen,aleve,or aspirin at least a week before surgery just Tylenol is any pain. Early ambulation for blood clot prevention.  Please be sure medication list is accurate. If a new problem present, please set up appointment sooner than planned today.

## 2021-11-27 LAB — COMPREHENSIVE METABOLIC PANEL
ALT: 13 U/L (ref 0–35)
AST: 14 U/L (ref 0–37)
Albumin: 3.9 g/dL (ref 3.5–5.2)
Alkaline Phosphatase: 40 U/L (ref 39–117)
BUN: 11 mg/dL (ref 6–23)
CO2: 29 mEq/L (ref 19–32)
Calcium: 8.9 mg/dL (ref 8.4–10.5)
Chloride: 106 mEq/L (ref 96–112)
Creatinine, Ser: 0.99 mg/dL (ref 0.40–1.20)
GFR: 73.42 mL/min (ref >60.00)
Glucose, Bld: 78 mg/dL (ref 70–99)
Potassium: 4.1 mEq/L (ref 3.5–5.1)
Sodium: 139 mEq/L (ref 135–145)
Total Bilirubin: 0.6 mg/dL (ref 0.2–1.2)
Total Protein: 6.8 g/dL (ref 6.0–8.3)

## 2021-11-27 LAB — CERVICOVAGINAL ANCILLARY ONLY
Bacterial Vaginitis (gardnerella): POSITIVE — AB
Candida Glabrata: NEGATIVE
Candida Vaginitis: NEGATIVE
Chlamydia: NEGATIVE
Comment: NEGATIVE
Comment: NEGATIVE
Comment: NEGATIVE
Comment: NEGATIVE
Comment: NEGATIVE
Comment: NORMAL
Neisseria Gonorrhea: NEGATIVE
Trichomonas: NEGATIVE

## 2021-11-28 ENCOUNTER — Other Ambulatory Visit: Payer: Self-pay | Admitting: Obstetrics

## 2021-11-28 ENCOUNTER — Telehealth: Payer: Self-pay

## 2021-11-28 DIAGNOSIS — B9689 Other specified bacterial agents as the cause of diseases classified elsewhere: Secondary | ICD-10-CM

## 2021-11-28 DIAGNOSIS — N760 Acute vaginitis: Secondary | ICD-10-CM

## 2021-11-28 LAB — CYTOLOGY - PAP
Comment: NEGATIVE
Diagnosis: NEGATIVE
High risk HPV: NEGATIVE

## 2021-11-28 MED ORDER — METRONIDAZOLE 500 MG PO TABS: 500.0000 mg | ORAL_TABLET | Freq: Two times a day (BID) | ORAL | 2 refills | Status: DC

## 2021-11-28 NOTE — Telephone Encounter (Signed)
S/w pt and advised of results and rx sent. 

## 2021-12-01 ENCOUNTER — Ambulatory Visit: Payer: No Typology Code available for payment source | Admitting: Family Medicine

## 2022-02-10 ENCOUNTER — Encounter: Payer: Self-pay | Admitting: Endocrinology

## 2022-02-10 ENCOUNTER — Ambulatory Visit: Payer: No Typology Code available for payment source | Admitting: Endocrinology

## 2022-02-10 VITALS — BP 136/90 | HR 67 | Ht 66.0 in | Wt 183.6 lb

## 2022-02-10 DIAGNOSIS — E0590 Thyrotoxicosis, unspecified without thyrotoxic crisis or storm: Secondary | ICD-10-CM

## 2022-02-10 LAB — TSH: TSH: 0.48 u[IU]/mL (ref 0.35–5.50)

## 2022-02-10 LAB — T4, FREE: Free T4: 0.87 ng/dL (ref 0.60–1.60)

## 2022-02-10 NOTE — Progress Notes (Signed)
? ?Subjective:  ? ? Patient ID: Judith Moore, female    DOB: 01/27/1986, 36 y.o.   MRN: [id number] ? ?HPI ?Pt returns for f/u of hyperthyroidism (dx'ed 2011; she took tapazole 2016-2018;  she has never had thyroid imaging; she says she is not at risk for pregnancy; she has been off tapazole since mid-2018, due to normalization of TFT, but then resumed in 2022, due to low TSH).  Pt says she never misses the methimazole.  she denies palpitations and tremor.   ?Past Medical History:  ?Diagnosis Date  ? Allergies   ? Anxiety   ? Gallstones   ? Hyperthyroidism   ? ? ?Past Surgical History:  ?Procedure Laterality Date  ? ADENOIDECTOMY    ? CESAREAN SECTION  09/28/2005, 11/28/2007  ? CHOLECYSTECTOMY  04/2021  ? LAPAROSCOPIC APPENDECTOMY N/A 01/06/2016  ? Procedure: APPENDECTOMY LAPAROSCOPIC;  Surgeon: Luretha Murphy, MD;  Location: [facility];  Service: General;  Laterality: N/A;  ? stomach banding    ? TONSILLECTOMY    ? ? ?Social History  ? ?Socioeconomic History  ? Marital status: Divorced  ?  Spouse name: SEPARATED  ? Number of children: 2  ? Years of education: 15+  ? Highest education level: Not on file  ?Occupational History  ? Occupation: HEALTH CARE BILLING SPECIALIST  ?  Employer: [employer]  ?Tobacco Use  ? Smoking status: Never  ? Smokeless tobacco: Never  ?Vaping Use  ? Vaping Use: Never used  ?Substance and Sexual Activity  ? Alcohol use: Yes  ?  Comment: occasional  ? Drug use: Yes  ?  Types: Marijuana  ? Sexual activity: Yes  ?  Partners: Male  ?  Birth control/protection: Pill  ?  Comment: Mirena  ?Other Topics Concern  ? Not on file  ?Social History Narrative  ? Lives with her two sons, her uncle and cousin. Separated from husband 11/2010.  ? ?Social Determinants of Health  ? ?Financial Resource Strain: Not on file  ?Food Insecurity: Not on file  ?Transportation Needs: Not on file  ?Physical Activity: Not on file  ?Stress: Not on file  ?Social Connections: Not on file  ?Intimate Partner Violence: Not on file   ? ? ?Current Outpatient Medications on File Prior to Visit  ?Medication Sig Dispense Refill  ? BALCOLTRA 0.1-20 MG-MCG(21) TABS Take 1 tablet by mouth daily. 84 tablet 3  ? cetirizine (ZYRTEC ALLERGY) 10 MG tablet Take 1 tablet (10 mg total) by mouth daily. 30 tablet 11  ? ferrous sulfate 325 (65 FE) MG tablet Take 325 mg by mouth daily with breakfast.    ? fluocinonide gel (LIDEX) 0.05 % Apply 1 application topically 2 (two) times daily. 60 g 0  ? fluticasone (FLONASE) 50 MCG/ACT nasal spray Place 1 spray into both nostrils daily. (Patient taking differently: Place 1 spray into both nostrils daily as needed.) 16 g PRN  ? ibuprofen (ADVIL) 800 MG tablet Take 1 tablet (800 mg total) by mouth every 8 (eight) hours as needed. 30 tablet 5  ? ketoconazole (NIZORAL) 2 % shampoo APPLY 1 APPLICATION TOPICALLY 2 (TWO) TIMES A WEEK. 120 mL 0  ? methimazole (TAPAZOLE) 5 MG tablet Take 1.5 tablets (7.5 mg total) by mouth daily. 135 tablet 3  ? metroNIDAZOLE (FLAGYL) 500 MG tablet Take 1 tablet (500 mg total) by mouth 2 (two) times daily. 14 tablet 2  ? Multiple Vitamin (MULTIVITAMIN) tablet Take 1 tablet by mouth daily.    ? omeprazole (PRILOSEC) 40 MG  capsule Take 1 capsule (40 mg total) by mouth daily. 30 capsule 11  ? senna (SENOKOT) 8.6 MG tablet Take 1 tablet by mouth daily as needed for constipation.    ? Vitamin D, Ergocalciferol, (DRISDOL) 1.25 MG (50000 UNIT) CAPS capsule Take 1 capsule (50,000 Units total) by mouth every 14 (fourteen) days. 6 capsule 1  ? ?No current facility-administered medications on file prior to visit.  ? ? ?Allergies  ?Allergen Reactions  ? Almond (Diagnostic) Hives and Swelling  ?  Almonds  ? ? ?Family History  ?Problem Relation Age of Onset  ? Cancer Paternal Grandfather   ? Diabetes Paternal Grandmother   ? Hypertension Maternal Grandmother   ? Hypertension Maternal Grandfather   ? Kidney failure Maternal Grandfather   ? Hypertension Mother   ? Thyroid disease Mother   ?     multinodular  goiter  ? Healthy Brother   ?     x1  ? Diabetes Maternal Uncle   ? Heart disease Neg Hx   ? Colon cancer Neg Hx   ? ? ?BP 136/90 (BP Location: Left Arm, Patient Position: Sitting, Cuff Size: Normal)   Pulse 67   Ht 5\' 6"  (1.676 m)   Wt 183 lb 9.6 oz (83.3 kg)   SpO2 96%   BMI 29.63 kg/m?  ? ? ?Review of Systems ?Denies fever ?   ?Objective:  ? Physical Exam ?VITAL SIGNS:  See vs page ?GENERAL: no distress ?NECK: thyroid is 3 times normal size, diffuse. ? ?Lab Results  ?Component Value Date  ? TSH 0.48 02/10/2022  ? ?   ?Assessment & Plan:  ?Hyperthyroidism; well-controlled.  Please continue the same methimazole. ? ?

## 2022-02-10 NOTE — Patient Instructions (Addendum)
Blood tests are requested for you today.  We'll let you know about the results.   ?If ever you have fever while taking methimazole, stop it and call us, even if the reason is obvious, because of the risk of a rare side-effect.   ?It is best to never miss the medication.  However, if you do miss it, next best is to double up the next time.   ?You should have an endocrinology follow-up appointment in 4 months.   ?

## 2022-02-18 ENCOUNTER — Other Ambulatory Visit: Payer: Self-pay | Admitting: Family Medicine

## 2022-02-18 DIAGNOSIS — F419 Anxiety disorder, unspecified: Secondary | ICD-10-CM

## 2022-06-22 ENCOUNTER — Encounter: Payer: Self-pay | Admitting: Internal Medicine

## 2022-06-22 ENCOUNTER — Ambulatory Visit: Payer: No Typology Code available for payment source | Admitting: Internal Medicine

## 2022-06-22 VITALS — BP 104/68 | HR 80 | Ht 66.0 in | Wt 184.0 lb

## 2022-06-22 DIAGNOSIS — E0590 Thyrotoxicosis, unspecified without thyrotoxic crisis or storm: Secondary | ICD-10-CM | POA: Diagnosis not present

## 2022-06-22 DIAGNOSIS — E0500 Thyrotoxicosis with diffuse goiter without thyrotoxic crisis or storm: Secondary | ICD-10-CM | POA: Diagnosis not present

## 2022-06-22 LAB — T4, FREE: Free T4: 0.9 ng/dL (ref 0.60–1.60)

## 2022-06-22 LAB — TSH: TSH: 0.11 u[IU]/mL — ABNORMAL LOW (ref 0.35–5.50)

## 2022-06-22 NOTE — Progress Notes (Unsigned)
Name: Judith Moore  MRN/ DOB: [id number], 1986/08/15    Age/ Sex: 36 y.o., female     PCP: [place], Betty G, MD   Reason for Endocrinology Evaluation: Hyperthyroidism     Initial Endocrinology Clinic Visit: 08/22/2015    PATIENT IDENTIFIER: Judith Moore is a 36 y.o., female with a past medical history of hyperthyroidism, dyslipidemia, S/P bariatric sx   . She has followed with [facility] since 08/22/2015 for consultative assistance with management of her Hyperthyroidism.   HISTORICAL SUMMARY: The patient was first diagnosed with hyperthyroidism in 2011 due to Graves' disease. She was on Methimazole until 2018 but it was resumed in 2022 with a TSH 0.03 uIU/mL   Paternal grandmother with Graves' disease Mother with goiter    SUBJECTIVE:    Today (06/22/2022):  Judith Moore is here for a follow up on hyperthyroidism  She has been noted with weight loss since February of this year, intentional  Has palpitations and anxiety  Minimal hand tremors  Denies local neck swelling  Has noted burning and itching of the eyes   Has 2 kids 14 and 16 yrs        Methimazole 5 mg , 1.5 tabs daily   HISTORY:  Past Medical History:  Past Medical History:  Diagnosis Date   Allergies    Anxiety    Gallstones    Hyperthyroidism    Past Surgical History:  Past Surgical History:  Procedure Laterality Date   ADENOIDECTOMY     CESAREAN SECTION  09/28/2005, 11/28/2007   CHOLECYSTECTOMY  04/2021   LAPAROSCOPIC APPENDECTOMY N/A 01/06/2016   Procedure: APPENDECTOMY LAPAROSCOPIC;  Surgeon: Luretha Murphy, MD;  Location: [facility];  Service: General;  Laterality: N/A;   stomach banding     TONSILLECTOMY     Social History:  reports that she has never smoked. She has never used smokeless tobacco. She reports current alcohol use. She reports current drug use. Drug: Marijuana. Family History:  Family History  Problem Relation Age of Onset   Cancer Paternal  Grandfather    Diabetes Paternal Grandmother    Hypertension Maternal Grandmother    Hypertension Maternal Grandfather    Kidney failure Maternal Grandfather    Hypertension Mother    Thyroid disease Mother        multinodular goiter   Healthy Brother        x1   Diabetes Maternal Uncle    Heart disease Neg Hx    Colon cancer Neg Hx      HOME MEDICATIONS: Allergies as of 06/22/2022       Reactions   Almond (diagnostic) Hives, Swelling   Almonds        Medication List        Accurate as of June 22, 2022  1:48 PM. If you have any questions, ask your nurse or doctor.          Balcoltra 0.1-20 MG-MCG(21) Tabs Generic drug: Levonorgest-Eth Estrad-Fe Bisg Take 1 tablet by mouth daily.   cetirizine 10 MG tablet Commonly known as: ZyrTEC Allergy Take 1 tablet (10 mg total) by mouth daily.   ferrous sulfate 325 (65 FE) MG tablet Take 325 mg by mouth daily with breakfast.   fluocinonide gel 0.05 % Commonly known as: LIDEX Apply 1 application topically 2 (two) times daily.   fluticasone 50 MCG/ACT nasal spray Commonly known as: Flonase Place 1 spray into both nostrils daily. What changed:  when to take this reasons to take  this   hydrOXYzine 25 MG tablet Commonly known as: ATARAX Take 1-2 tablets (25-50 mg total) by mouth daily as needed for anxiety.   ibuprofen 800 MG tablet Commonly known as: ADVIL Take 1 tablet (800 mg total) by mouth every 8 (eight) hours as needed.   ketoconazole 2 % shampoo Commonly known as: NIZORAL APPLY 1 APPLICATION TOPICALLY 2 (TWO) TIMES A WEEK.   methimazole 5 MG tablet Commonly known as: TAPAZOLE Take 1.5 tablets (7.5 mg total) by mouth daily.   metroNIDAZOLE 500 MG tablet Commonly known as: FLAGYL Take 1 tablet (500 mg total) by mouth 2 (two) times daily.   multivitamin tablet Take 1 tablet by mouth daily.   omeprazole 40 MG capsule Commonly known as: PRILOSEC Take 1 capsule (40 mg total) by mouth daily.    senna 8.6 MG tablet Commonly known as: SENOKOT Take 1 tablet by mouth daily as needed for constipation.   Vitamin D (Ergocalciferol) 1.25 MG (50000 UNIT) Caps capsule Commonly known as: DRISDOL Take 1 capsule (50,000 Units total) by mouth every 14 (fourteen) days.          OBJECTIVE:   PHYSICAL EXAM: VS: BP 104/68 (BP Location: Left Arm, Patient Position: Sitting, Cuff Size: Large)   Pulse 80   Ht 5\' 6"  (1.676 m)   Wt 184 lb (83.5 kg)   BMI 29.70 kg/m    EXAM: General: Pt appears well and is in NAD  Eyes: External eye exam normal without stare, lid lag or exophthalmos.  EOM intact.    Neck: General: Supple without adenopathy. Thyroid: Thyroid size normal.  No goiter or nodules appreciated. No thyroid bruit.  Lungs: Clear with good BS bilat with no rales, rhonchi, or wheezes  Heart: Auscultation: RRR.  Abdomen: Normoactive bowel sounds, soft, nontender, without masses or organomegaly palpable  Extremities:  BL LE: No pretibial edema normal ROM and strength.  Mental Status: Judgment, insight: Intact Orientation: Oriented to time, place, and person Mood and affect: No depression, anxiety, or agitation     DATA REVIEWED: ***   Latest Reference Range & Units 11/26/21 08:00  Sodium 135 - 145 mEq/L 139  Potassium 3.5 - 5.1 mEq/L 4.1  Chloride 96 - 112 mEq/L 106  CO2 19 - 32 mEq/L 29  Glucose 70 - 99 mg/dL 78  BUN 6 - 23 mg/dL 11  Creatinine 11/28/21 - 0.03 mg/dL 4.91  Calcium 8.4 - 7.91 mg/dL 8.9  Alkaline Phosphatase 39 - 117 U/L 40  Albumin 3.5 - 5.2 g/dL 3.9  AST 0 - 37 U/L 14  ALT 0 - 35 U/L 13  Total Protein 6.0 - 8.3 g/dL 6.8  Total Bilirubin 0.2 - 1.2 mg/dL 0.6  GFR 50.5 mL/min 73.42    ASSESSMENT / PLAN / RECOMMENDATIONS:   Hyperthyroidism   - Pt with hyperthyroid symptoms - No local neck symptoms  - Pt with imperfect adherence to methimazole      Medications   2. Graves' Disease:   - Pt with burning and itching sensation of the eyes -  She follows with optometry    F/U in 4 months   Signed electronically by: >[email or web address], MD  [facility] Endocrinology  [facility] [address] [place] [place], [place] Phone: [phone] FAX: [phone]      CC: [phone], Betty G, MD [address] [place] Phone: [phone]  Fax: [phone]   Return to Endocrinology clinic as below: No future appointments.

## 2022-06-23 LAB — T3: T3, Total: 127 ng/dL (ref 76–181)

## 2022-06-23 MED ORDER — METHIMAZOLE 5 MG PO TABS: 10.0000 mg | ORAL_TABLET | Freq: Every day | ORAL | 3 refills | Status: DC

## 2022-07-23 ENCOUNTER — Encounter: Payer: Self-pay | Admitting: Internal Medicine

## 2022-07-24 MED ORDER — ATENOLOL 25 MG PO TABS: 25.0000 mg | ORAL_TABLET | Freq: Every day | ORAL | 3 refills | Status: DC

## 2022-09-08 IMAGING — CT CT ANGIO CHEST-ABD-PELV FOR DISSECTION W/ AND WO/W CM
2 of 7 series · 14 of 46 positions shown, 16 images · non-contrast
Comparison: January 06, 2016.

CLINICAL DATA: Abdominal pain.

EXAM:
CT ANGIOGRAPHY CHEST, ABDOMEN AND PELVIS
TECHNIQUE: Non-contrast CT of the chest was initially obtained.

[Series 8: dissection 3.0 i30f 3 · axial · 0.72mm/px · z∈[+747,+1296]mm · 11 of 209 slices shown, 13 images]
[im 13/209  soft-tissue]
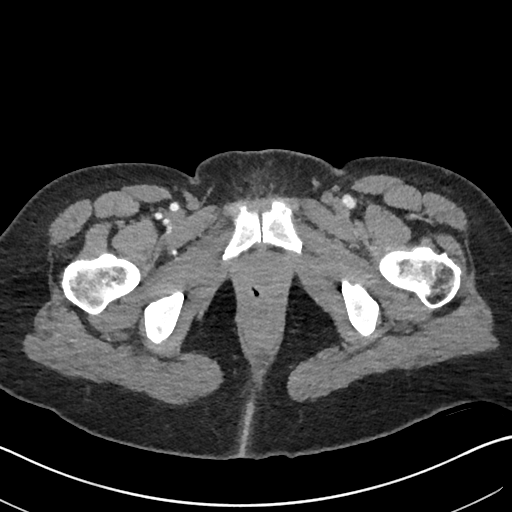
[im 13/209  bone]
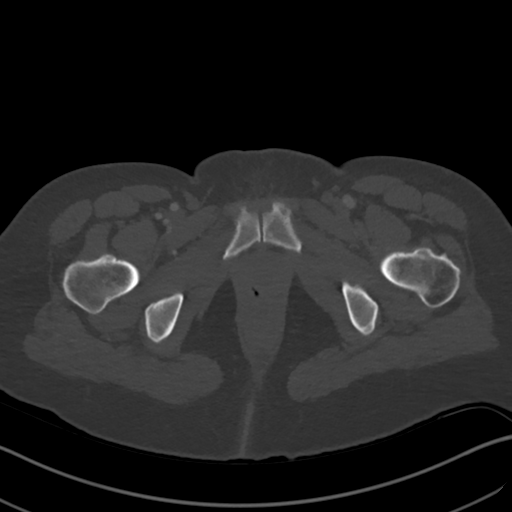
[im 37/209  soft-tissue]
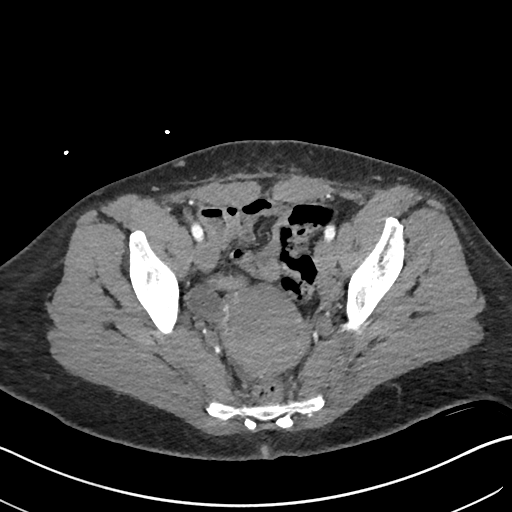
[im 49/209  soft-tissue]
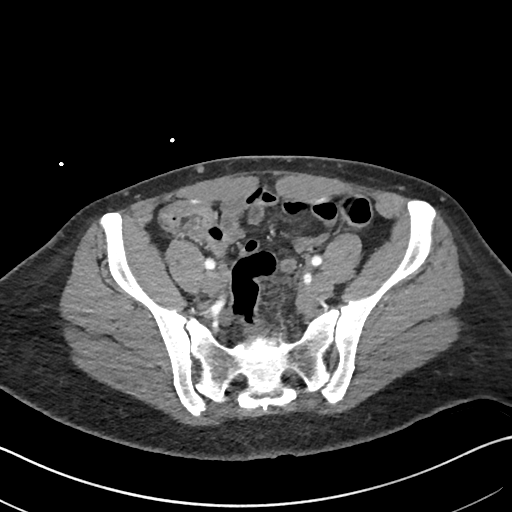
[im 74/209  soft-tissue]
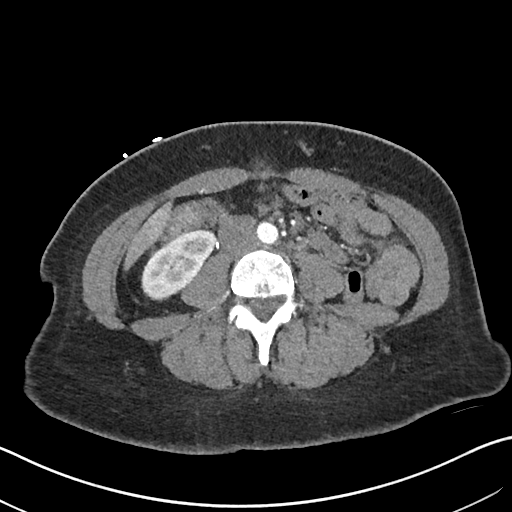
[im 86/209  soft-tissue]
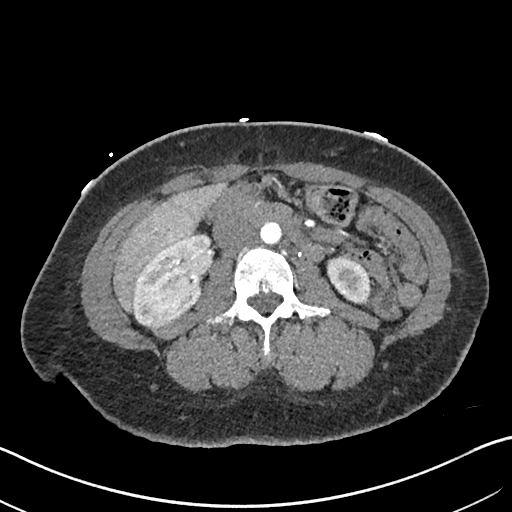
[im 111/209  soft-tissue]
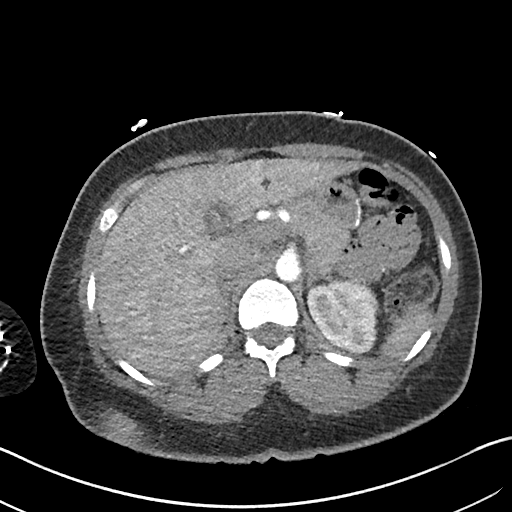
[im 123/209  soft-tissue]
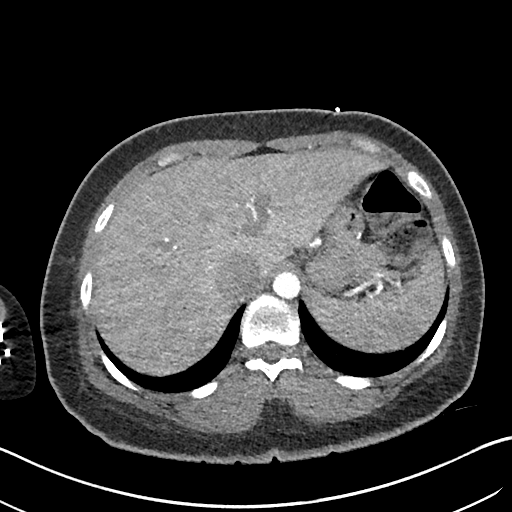
[im 135/209  soft-tissue]
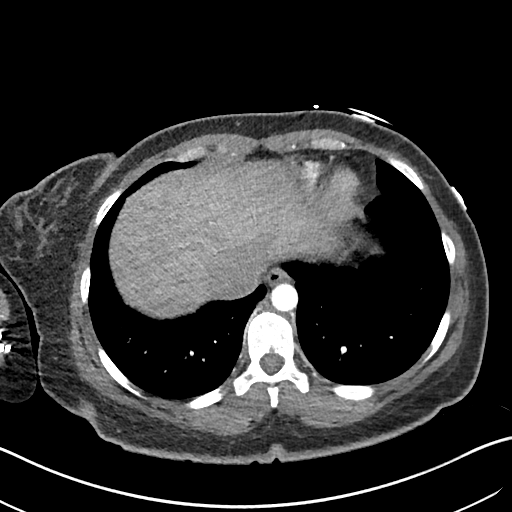
[im 160/209  soft-tissue]
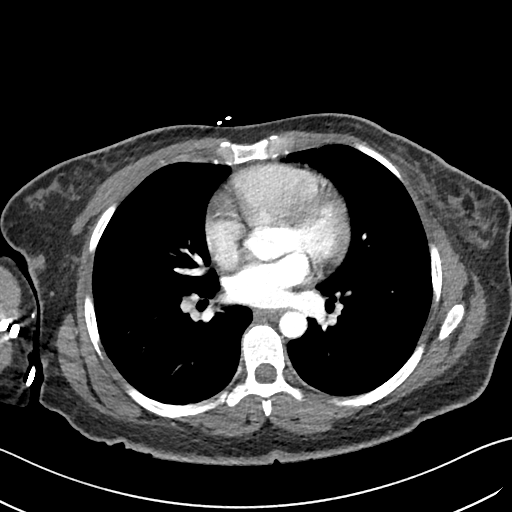
[im 160/209  bone]
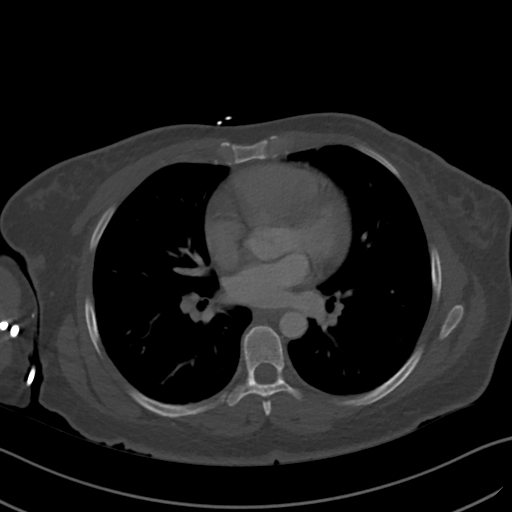
[im 172/209  soft-tissue]
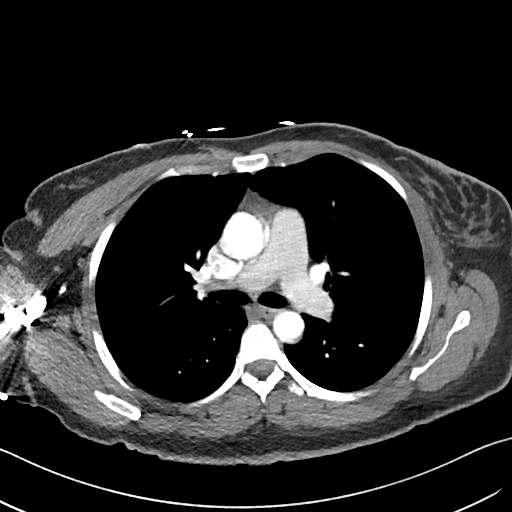
[im 196/209  soft-tissue]
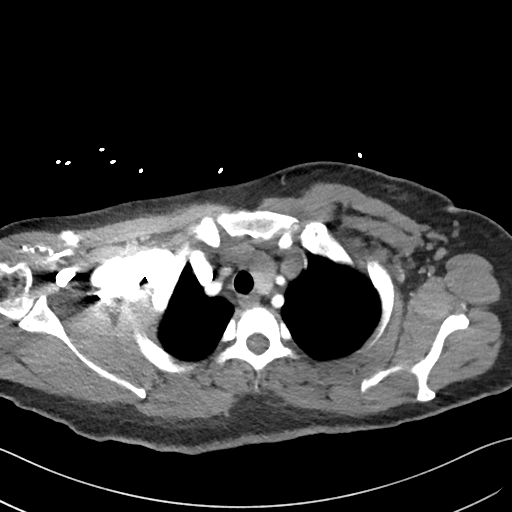

[Series 11: coronals · coronal · 0.75mm/px · 3 of 148 slices shown]
[im 37/148  soft-tissue]
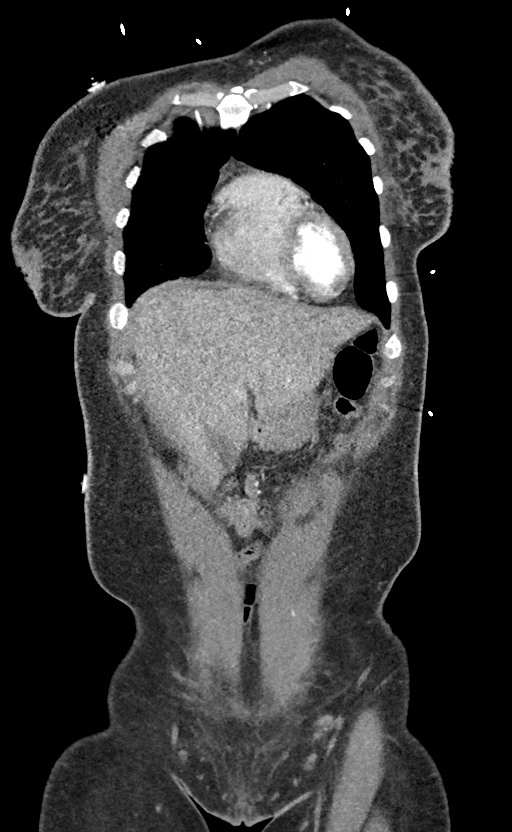
[im 74/148  soft-tissue]
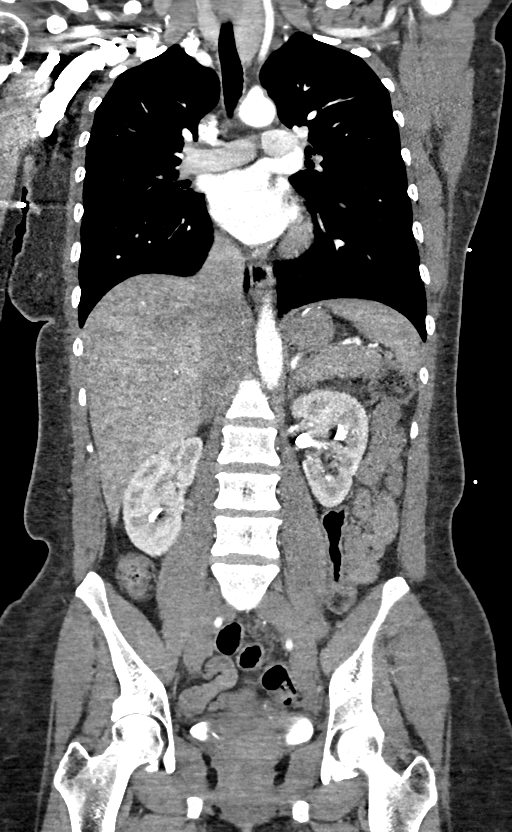
[im 111/148  soft-tissue]
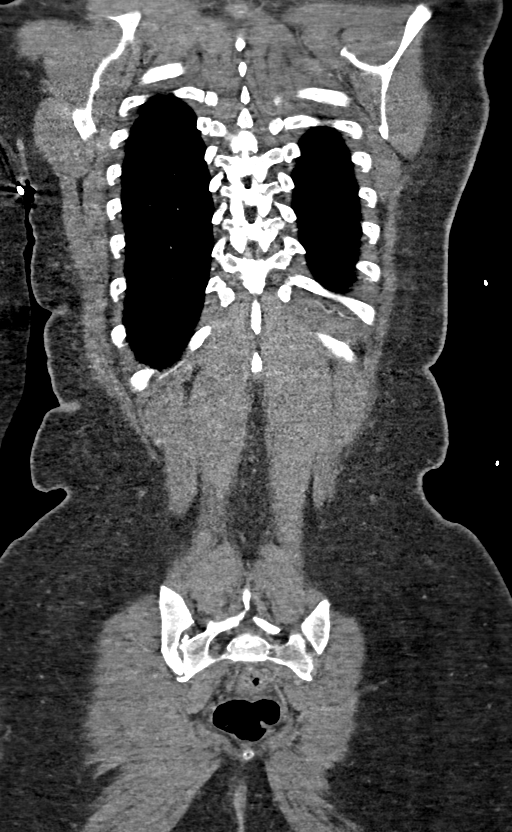

[14 of 46 positions shown; findings below may reference images not displayed]

Multidetector CT imaging through the chest, abdomen and pelvis was
performed using the standard protocol during bolus administration of
intravenous contrast. Multiplanar reconstructed images and MIPs were
obtained and reviewed to evaluate the vascular anatomy.

CONTRAST:  100mL OMNIPAQUE IOHEXOL 350 MG/ML SOLN
FINDINGS: CTA CHEST FINDINGS

Cardiovascular: Satisfactory opacification of the pulmonary arteries
to the segmental level. No evidence of pulmonary embolism. Normal
heart size. No pericardial effusion.

Mediastinum/Nodes: No enlarged mediastinal, hilar, or axillary lymph
nodes. Thyroid gland, trachea, and esophagus demonstrate no
significant findings.

Lungs/Pleura: Lungs are clear. No pleural effusion or pneumothorax.

Musculoskeletal: No chest wall abnormality. No acute or significant
osseous findings.

Review of the MIP images confirms the above findings.

CTA ABDOMEN AND PELVIS FINDINGS

VASCULAR

Aorta: Normal caliber aorta without aneurysm, dissection, vasculitis
or significant stenosis.

Celiac: Patent without evidence of aneurysm, dissection, vasculitis
or significant stenosis.

SMA: Patent without evidence of aneurysm, dissection, vasculitis or
significant stenosis.

Renals: Both renal arteries are patent without evidence of aneurysm,
dissection, vasculitis, fibromuscular dysplasia or significant
stenosis.

IMA: Patent without evidence of aneurysm, dissection, vasculitis or
significant stenosis.

Inflow: Patent without evidence of aneurysm, dissection, vasculitis
or significant stenosis.

Veins: No obvious venous abnormality within the limitations of this
arterial phase study.

Review of the MIP images confirms the above findings.

NON-VASCULAR

Hepatobiliary: Gallstone is noted. No biliary dilatation is noted.
The liver is unremarkable.

Pancreas: Unremarkable. No pancreatic ductal dilatation or
surrounding inflammatory changes.

Spleen: Normal in size without focal abnormality.

Adrenals/Urinary Tract: Adrenal glands are unremarkable. Kidneys are
normal, without renal calculi, focal lesion, or hydronephrosis.
Bladder is unremarkable.

Stomach/Bowel: Status post gastric bypass. No evidence of bowel
obstruction or inflammation. Status post appendectomy.

Lymphatic: No adenopathy is noted.

Reproductive: Uterus and bilateral adnexa are unremarkable.

Other: No abdominal wall hernia or abnormality. No abdominopelvic
ascites.

Musculoskeletal: No acute or significant osseous findings.

Review of the MIP images confirms the above findings.
IMPRESSION: 1. No definite evidence of pulmonary embolus.
2. No evidence of thoracic or abdominal aortic dissection or
aneurysm.
3. Cholelithiasis.
4. Status post gastric bypass and appendectomy.
5. No other abnormality seen in the chest, abdomen or pelvis.

## 2022-09-17 ENCOUNTER — Ambulatory Visit: Payer: No Typology Code available for payment source | Admitting: Internal Medicine

## 2022-09-17 ENCOUNTER — Encounter: Payer: Self-pay | Admitting: Internal Medicine

## 2022-09-17 VITALS — BP 120/74 | HR 78 | Ht 66.0 in | Wt 194.0 lb

## 2022-09-17 DIAGNOSIS — E0500 Thyrotoxicosis with diffuse goiter without thyrotoxic crisis or storm: Secondary | ICD-10-CM

## 2022-09-17 DIAGNOSIS — E0590 Thyrotoxicosis, unspecified without thyrotoxic crisis or storm: Secondary | ICD-10-CM | POA: Diagnosis not present

## 2022-09-17 LAB — TSH: TSH: 0.64 u[IU]/mL (ref 0.35–5.50)

## 2022-09-17 LAB — T4, FREE: Free T4: 0.88 ng/dL (ref 0.60–1.60)

## 2022-09-17 NOTE — Progress Notes (Signed)
Name: JOYA WILLMOTT  MRN/ DOB: [id number], Jul 15, 1986    Age/ Sex: 36 y.o., female     PCP: [place], Betty G, MD   Reason for Endocrinology Evaluation: Hyperthyroidism     Initial Endocrinology Clinic Visit: 08/22/2015    PATIENT IDENTIFIER: Ms. SONALI WIVELL is a 36 y.o., female with a past medical history of hyperthyroidism, dyslipidemia, S/P bariatric sx   . She has followed with [facility] since 08/22/2015 for consultative assistance with management of her Hyperthyroidism.   HISTORICAL SUMMARY: The patient was first diagnosed with hyperthyroidism in 2011 due to Graves' disease. She was on Methimazole until 2018 but it was resumed in 2022 with a TSH 0.03 uIU/mL   Paternal grandmother with Graves' disease Mother with goiter    She was followed by Dr. Everardo All from 2016 until 02/2022  SUBJECTIVE:    Today (09/17/2022):  Ms. Wrisley is here for a follow up on hyperthyroidism   She was been noted with weight gain  Has palpitations as well as anxiety  Minimal hand tremors  Denies local neck swelling  Has noted burning and itching of the eyes    Has 2 kids 14 and 16 yrs     Methimazole 5 mg , 2 tabs daily  but was on 4 tabs up until a week ago  Atenolol 25 mg daily    HISTORY:  Past Medical History:  Past Medical History:  Diagnosis Date   Allergies    Anxiety    Gallstones    Hyperthyroidism    Past Surgical History:  Past Surgical History:  Procedure Laterality Date   ADENOIDECTOMY     CESAREAN SECTION  09/28/2005, 11/28/2007   CHOLECYSTECTOMY  04/2021   LAPAROSCOPIC APPENDECTOMY N/A 01/06/2016   Procedure: APPENDECTOMY LAPAROSCOPIC;  Surgeon: Luretha Murphy, MD;  Location: [facility];  Service: General;  Laterality: N/A;   stomach banding     TONSILLECTOMY     Social History:  reports that she has never smoked. She has never used smokeless tobacco. She reports current alcohol use. She reports current drug use. Drug: Marijuana. Family  History:  Family History  Problem Relation Age of Onset   Cancer Paternal Grandfather    Diabetes Paternal Grandmother    Hypertension Maternal Grandmother    Hypertension Maternal Grandfather    Kidney failure Maternal Grandfather    Hypertension Mother    Thyroid disease Mother        multinodular goiter   Healthy Brother        x1   Diabetes Maternal Uncle    Heart disease Neg Hx    Colon cancer Neg Hx      HOME MEDICATIONS: Allergies as of 09/17/2022       Reactions   Almond (diagnostic) Hives, Swelling   Almonds        Medication List        Accurate as of September 17, 2022  7:20 AM. If you have any questions, ask your nurse or doctor.          atenolol 25 MG tablet Commonly known as: TENORMIN Take 1 tablet (25 mg total) by mouth daily.   Balcoltra 0.1-20 MG-MCG(21) Tabs Generic drug: Levonorgest-Eth Estradiol-Iron Take 1 tablet by mouth daily.   cetirizine 10 MG tablet Commonly known as: ZyrTEC Allergy Take 1 tablet (10 mg total) by mouth daily.   ferrous sulfate 325 (65 FE) MG tablet Take 325 mg by mouth daily with breakfast.   fluocinonide gel 0.05 %  Commonly known as: LIDEX Apply 1 application topically 2 (two) times daily.   fluticasone 50 MCG/ACT nasal spray Commonly known as: Flonase Place 1 spray into both nostrils daily. What changed:  when to take this reasons to take this   hydrOXYzine 25 MG tablet Commonly known as: ATARAX Take 1-2 tablets (25-50 mg total) by mouth daily as needed for anxiety.   ibuprofen 800 MG tablet Commonly known as: ADVIL Take 1 tablet (800 mg total) by mouth every 8 (eight) hours as needed.   ketoconazole 2 % shampoo Commonly known as: NIZORAL APPLY 1 APPLICATION TOPICALLY 2 (TWO) TIMES A WEEK.   methimazole 5 MG tablet Commonly known as: TAPAZOLE Take 2 tablets (10 mg total) by mouth daily.   metroNIDAZOLE 500 MG tablet Commonly known as: FLAGYL Take 1 tablet (500 mg total) by mouth 2 (two) times  daily.   multivitamin tablet Take 1 tablet by mouth daily.   omeprazole 40 MG capsule Commonly known as: PRILOSEC Take 1 capsule (40 mg total) by mouth daily.   senna 8.6 MG tablet Commonly known as: SENOKOT Take 1 tablet by mouth daily as needed for constipation.   Vitamin D (Ergocalciferol) 1.25 MG (50000 UNIT) Caps capsule Commonly known as: DRISDOL Take 1 capsule (50,000 Units total) by mouth every 14 (fourteen) days.          OBJECTIVE:   PHYSICAL EXAM: VS: BP 120/74 (BP Location: Left Arm, Patient Position: Sitting, Cuff Size: Large)   Pulse 78   Ht 5\' 6"  (1.676 m)   Wt 194 lb (88 kg)   SpO2 99%   BMI 31.31 kg/m    EXAM: General: Pt appears well and is in NAD  Eyes: External eye exam normal without stare, lid lag or exophthalmos.  EOM intact.    Neck: General: Supple without adenopathy. Thyroid: Prominent thyroid gland  Lungs: Clear with good BS bilat with no rales, rhonchi, or wheezes  Heart: Auscultation: RRR.  Abdomen: Normoactive bowel sounds, soft, nontender, without masses or organomegaly palpable  Extremities:  BL LE: No pretibial edema normal ROM and strength.  Mental Status: Judgment, insight: Intact Orientation: Oriented to time, place, and person Mood and affect: No depression, anxiety, or agitation     DATA REVIEWED:  Latest Reference Range & Units 09/17/22 09:39  TSH 0.35 - 5.50 uIU/mL 0.64  Triiodothyronine (T3) 76 - 181 ng/dL 14/07/23  782) N5,AOZH(YQMVHQ - 1.60 ng/dL 4.69  THYROID STIMULATING IMMUNOGLOBULIN  Rpt  Rpt: View report in Results Review for more information     Latest Reference Range & Units 11/26/21 08:00  Sodium 135 - 145 mEq/L 139  Potassium 3.5 - 5.1 mEq/L 4.1  Chloride 96 - 112 mEq/L 106  CO2 19 - 32 mEq/L 29  Glucose 70 - 99 mg/dL 78  BUN 6 - 23 mg/dL 11  Creatinine 11/28/21 - 5.28 mg/dL 4.13  Calcium 8.4 - 2.44 mg/dL 8.9  Alkaline Phosphatase 39 - 117 U/L 40  Albumin 3.5 - 5.2 g/dL 3.9  AST 0 - 37 U/L 14  ALT 0 -  35 U/L 13  Total Protein 6.0 - 8.3 g/dL 6.8  Total Bilirubin 0.2 - 1.2 mg/dL 0.6  GFR 01.0 mL/min 73.42   Old records , labs and images have been reviewed.   ASSESSMENT / PLAN / RECOMMENDATIONS:   Hyperthyroidism   - Pt with hyperthyroid symptoms - No local neck symptoms  -She has self increase methimazole to 4 tablets daily, but has reduced this to twice daily -  TFTs normal today, no change   Medications  Continue methimazole 5 mg, 2 tabs daily    2. Graves' Disease:   - Pt with burning and itching sensation of the eyes - She follows with optometry    F/U in 4 months   Signed electronically by: Lyndle Herrlich, MD  [facility] Endocrinology  [facility] [address] [place] [place], [place] [address] Phone: [phone] FAX: [phone]      CC: [place], Betty G, MD [address] [place] [address] Phone: [phone]  Fax: [phone]   Return to Endocrinology clinic as below: Future Appointments  Date Time Provider Department Center  09/17/2022  9:30 AM Sebrina Kessner, Konrad Dolores, MD [id number] None  09/22/2022  8:30 AM [place], Betty G, MD [facility]

## 2022-09-18 MED ORDER — METHIMAZOLE 5 MG PO TABS: 10.0000 mg | ORAL_TABLET | Freq: Every day | ORAL | 3 refills | Status: DC

## 2022-09-21 LAB — THYROID STIMULATING IMMUNOGLOBULIN: TSI: <89 % baseline

## 2022-09-21 LAB — T3: T3, Total: 129 ng/dL (ref 76–181)

## 2022-09-21 NOTE — Progress Notes (Unsigned)
HPI: Judith Moore is a 36 y.o. female, who is here today for her routine physical.  Last CPE: 08/06/2021  Regular exercise 3 or more time per week: *** Following a healthy diet: ***  Chronic medical problems: ***  Immunization History  Administered Date(s) Administered   Tdap 07/29/2020   Health Maintenance  Topic Date Due   COVID-19 Vaccine (1) Never done   INFLUENZA VACCINE  Never done   PAP SMEAR-Modifier  11/25/2024   DTaP/Tdap/Td (2 - Td or Tdap) 07/29/2030   Hepatitis C Screening  Completed   HIV Screening  Completed   HPV VACCINES  Aged Out    She has *** concerns today.  Review of Systems  Current Outpatient Medications on File Prior to Visit  Medication Sig Dispense Refill   atenolol (TENORMIN) 25 MG tablet Take 1 tablet (25 mg total) by mouth daily. 90 tablet 3   cetirizine (ZYRTEC ALLERGY) 10 MG tablet Take 1 tablet (10 mg total) by mouth daily. 30 tablet 11   ferrous sulfate 325 (65 FE) MG tablet Take 325 mg by mouth daily with breakfast. (Patient not taking: Reported on 09/17/2022)     fluocinonide gel (LIDEX) 0.05 % Apply 1 application topically 2 (two) times daily. 60 g 0   fluticasone (FLONASE) 50 MCG/ACT nasal spray Place 1 spray into both nostrils daily. (Patient taking differently: Place 1 spray into both nostrils daily as needed.) 16 g PRN   hydrOXYzine (ATARAX) 25 MG tablet Take 1-2 tablets (25-50 mg total) by mouth daily as needed for anxiety. 30 tablet 0   ibuprofen (ADVIL) 800 MG tablet Take 1 tablet (800 mg total) by mouth every 8 (eight) hours as needed. 30 tablet 5   ketoconazole (NIZORAL) 2 % shampoo APPLY 1 APPLICATION TOPICALLY 2 (TWO) TIMES A WEEK. 120 mL 0   methimazole (TAPAZOLE) 5 MG tablet Take 2 tablets (10 mg total) by mouth daily. 180 tablet 3   Multiple Vitamin (MULTIVITAMIN) tablet Take 1 tablet by mouth daily.     senna (SENOKOT) 8.6 MG tablet Take 1 tablet by mouth daily as needed for constipation.     Vitamin D,  Ergocalciferol, (DRISDOL) 1.25 MG (50000 UNIT) CAPS capsule Take 1 capsule (50,000 Units total) by mouth every 14 (fourteen) days. 6 capsule 1   No current facility-administered medications on file prior to visit.    Past Medical History:  Diagnosis Date   Allergies    Anxiety    Gallstones    Hyperthyroidism     Past Surgical History:  Procedure Laterality Date   ADENOIDECTOMY     CESAREAN SECTION  09/28/2005, 11/28/2007   CHOLECYSTECTOMY  04/2021   LAPAROSCOPIC APPENDECTOMY N/A 01/06/2016   Procedure: APPENDECTOMY LAPAROSCOPIC;  Surgeon: Luretha Murphy, MD;  Location: [facility];  Service: General;  Laterality: N/A;   stomach banding     TONSILLECTOMY      Allergies  Allergen Reactions   Almond (Diagnostic) Hives and Swelling    Almonds    Family History  Problem Relation Age of Onset   Cancer Paternal Grandfather    Diabetes Paternal Grandmother    Hypertension Maternal Grandmother    Hypertension Maternal Grandfather    Kidney failure Maternal Grandfather    Hypertension Mother    Thyroid disease Mother        multinodular goiter   Healthy Brother        x1   Diabetes Maternal Uncle    Heart disease Neg Hx  Colon cancer Neg Hx     Social History   Socioeconomic History   Marital status: Divorced    Spouse name: SEPARATED   Number of children: 2   Years of education: 15+   Highest education level: Not on file  Occupational History   Occupation: HEALTH CARE BILLING SPECIALIST    Employer: [employer]  Tobacco Use   Smoking status: Never   Smokeless tobacco: Never  Vaping Use   Vaping Use: Never used  Substance and Sexual Activity   Alcohol use: Yes    Comment: occasional   Drug use: Yes    Types: Marijuana   Sexual activity: Yes    Partners: Male    Birth control/protection: Pill    Comment: Mirena  Other Topics Concern   Not on file  Social History Narrative   Lives with her two sons, her uncle and cousin. Separated from husband 11/2010.    Social Determinants of Health   Financial Resource Strain: Not on file  Food Insecurity: Not on file  Transportation Needs: Not on file  Physical Activity: Not on file  Stress: Not on file  Social Connections: Not on file    There were no vitals filed for this visit. There is no height or weight on file to calculate BMI.  Wt Readings from Last 3 Encounters:  09/17/22 194 lb (88 kg)  06/22/22 184 lb (83.5 kg)  02/10/22 183 lb 9.6 oz (83.3 kg)    Physical Exam  ASSESSMENT AND PLAN: Ms. Judith Moore was here today annual physical examination.  No orders of the defined types were placed in this encounter.   There are no diagnoses linked to this encounter.  There are no diagnoses linked to this encounter.  No follow-ups on file.  Janessa Mickle G. [place], MD  [facility]. [place] office.

## 2022-09-22 ENCOUNTER — Ambulatory Visit (INDEPENDENT_AMBULATORY_CARE_PROVIDER_SITE_OTHER): Payer: No Typology Code available for payment source | Admitting: Family Medicine

## 2022-09-22 ENCOUNTER — Encounter: Payer: Self-pay | Admitting: Family Medicine

## 2022-09-22 VITALS — BP 120/80 | HR 64 | Temp 98.9°F | Resp 12 | Ht 66.0 in | Wt 191.0 lb

## 2022-09-22 DIAGNOSIS — E785 Hyperlipidemia, unspecified: Secondary | ICD-10-CM

## 2022-09-22 DIAGNOSIS — Z9884 Bariatric surgery status: Secondary | ICD-10-CM

## 2022-09-22 DIAGNOSIS — E559 Vitamin D deficiency, unspecified: Secondary | ICD-10-CM | POA: Diagnosis not present

## 2022-09-22 DIAGNOSIS — D509 Iron deficiency anemia, unspecified: Secondary | ICD-10-CM | POA: Diagnosis not present

## 2022-09-22 DIAGNOSIS — Z0000 Encounter for general adult medical examination without abnormal findings: Secondary | ICD-10-CM | POA: Diagnosis not present

## 2022-09-22 DIAGNOSIS — Z130 Encounter for screening for diseases of the blood and blood-forming organs and certain disorders involving the immune mechanism: Secondary | ICD-10-CM

## 2022-09-22 DIAGNOSIS — Z13228 Encounter for screening for other metabolic disorders: Secondary | ICD-10-CM

## 2022-09-22 DIAGNOSIS — Z1329 Encounter for screening for other suspected endocrine disorder: Secondary | ICD-10-CM | POA: Diagnosis not present

## 2022-09-22 LAB — CBC
HCT: 38.7 % (ref 36.0–46.0)
Hemoglobin: 12.9 g/dL (ref 12.0–15.0)
MCHC: 33.4 g/dL (ref 30.0–36.0)
MCV: 82.7 fl (ref 78.0–100.0)
Platelets: 322 10*3/uL (ref 150.0–400.0)
RBC: 4.68 Mil/uL (ref 3.87–5.11)
RDW: 13.6 % (ref 11.5–15.5)
WBC: 3.8 10*3/uL — ABNORMAL LOW (ref 4.0–10.5)

## 2022-09-22 LAB — BASIC METABOLIC PANEL
BUN: 6 mg/dL (ref 6–23)
CO2: 29 mEq/L (ref 19–32)
Calcium: 9.6 mg/dL (ref 8.4–10.5)
Chloride: 104 mEq/L (ref 96–112)
Creatinine, Ser: 0.88 mg/dL (ref 0.40–1.20)
GFR: 84.08 mL/min (ref >60.00)
Glucose, Bld: 91 mg/dL (ref 70–99)
Potassium: 4.6 mEq/L (ref 3.5–5.1)
Sodium: 140 mEq/L (ref 135–145)

## 2022-09-22 LAB — VITAMIN B12: Vitamin B-12: 514 pg/mL (ref 211–911)

## 2022-09-22 LAB — FERRITIN: Ferritin: 23 ng/mL (ref 10.0–291.0)

## 2022-09-22 LAB — LIPID PANEL
Cholesterol: 207 mg/dL — ABNORMAL HIGH (ref 0–200)
HDL: 74.5 mg/dL (ref >39.00)
LDL Cholesterol: 122 mg/dL — ABNORMAL HIGH (ref 0–99)
NonHDL: 132.59
Total CHOL/HDL Ratio: 3
Triglycerides: 55 mg/dL (ref 0.0–149.0)
VLDL: 11 mg/dL (ref 0.0–40.0)

## 2022-09-22 LAB — VITAMIN D 25 HYDROXY (VIT D DEFICIENCY, FRACTURES): VITD: 30.62 ng/mL (ref 30.00–100.00)

## 2022-09-22 LAB — HEMOGLOBIN A1C: Hgb A1c MFr Bld: 5.8 % (ref 4.6–6.5)

## 2022-09-22 LAB — IRON: Iron: 69 ug/dL (ref 42–145)

## 2022-09-22 NOTE — Patient Instructions (Addendum)
A few things to remember from today's visit:  Hyperlipidemia, unspecified hyperlipidemia type - Plan: Lipid panel  Vitamin D deficiency, unspecified - Plan: VITAMIN D 25 Hydroxy (Vit-D Deficiency, Fractures)  Routine general medical examination at a health care facility  Screening for endocrine, metabolic and immunity disorder - Plan: Hemoglobin A1c  If you need refills for medications you take chronically, please call your pharmacy. Do not use My Chart to request refills or for acute issues that need immediate attention. If you send a my chart message, it may take a few days to be addressed, specially if I am not in the office.  Please be sure medication list is accurate. If a new problem present, please set up appointment sooner than planned today.  Health Maintenance, Female Adopting a healthy lifestyle and getting preventive care are important in promoting health and wellness. Ask your health care provider about: The right schedule for you to have regular tests and exams. Things you can do on your own to prevent diseases and keep yourself healthy. What should I know about diet, weight, and exercise? Eat a healthy diet  Eat a diet that includes plenty of vegetables, fruits, low-fat dairy products, and lean protein. Do not eat a lot of foods that are high in solid fats, added sugars, or sodium. Maintain a healthy weight Body mass index (BMI) is used to identify weight problems. It estimates body fat based on height and weight. Your health care provider can help determine your BMI and help you achieve or maintain a healthy weight. Get regular exercise Get regular exercise. This is one of the most important things you can do for your health. Most adults should: Exercise for at least 150 minutes each week. The exercise should increase your heart rate and make you sweat (moderate-intensity exercise). Do strengthening exercises at least twice a week. This is in addition to the  moderate-intensity exercise. Spend less time sitting. Even light physical activity can be beneficial. Watch cholesterol and blood lipids Have your blood tested for lipids and cholesterol at 37 years of age, then have this test every 5 years. Have your cholesterol levels checked more often if: Your lipid or cholesterol levels are high. You are older than 37 years of age. You are at high risk for heart disease. What should I know about cancer screening? Depending on your health history and family history, you may need to have cancer screening at various ages. This may include screening for: Breast cancer. Cervical cancer. Colorectal cancer. Skin cancer. Lung cancer. What should I know about heart disease, diabetes, and high blood pressure? Blood pressure and heart disease High blood pressure causes heart disease and increases the risk of stroke. This is more likely to develop in people who have high blood pressure readings or are overweight. Have your blood pressure checked: Every 3-5 years if you are 15-43 years of age. Every year if you are 33 years old or older. Diabetes Have regular diabetes screenings. This checks your fasting blood sugar level. Have the screening done: Once every three years after age 64 if you are at a normal weight and have a low risk for diabetes. More often and at a younger age if you are overweight or have a high risk for diabetes. What should I know about preventing infection? Hepatitis B If you have a higher risk for hepatitis B, you should be screened for this virus. Talk with your health care provider to find out if you are at risk for hepatitis  B infection. Hepatitis C Testing is recommended for: Everyone born from [birth year] through 1966. Anyone with known risk factors for hepatitis C. Sexually transmitted infections (STIs) Get screened for STIs, including gonorrhea and chlamydia, if: You are sexually active and are younger than 37 years of age. You are  older than 37 years of age and your health care provider tells you that you are at risk for this type of infection. Your sexual activity has changed since you were last screened, and you are at increased risk for chlamydia or gonorrhea. Ask your health care provider if you are at risk. Ask your health care provider about whether you are at high risk for HIV. Your health care provider may recommend a prescription medicine to help prevent HIV infection. If you choose to take medicine to prevent HIV, you should first get tested for HIV. You should then be tested every 3 months for as long as you are taking the medicine. Pregnancy If you are about to stop having your period (premenopausal) and you may become pregnant, seek counseling before you get pregnant. Take 400 to 800 micrograms (mcg) of folic acid every day if you become pregnant. Ask for birth control (contraception) if you want to prevent pregnancy. Osteoporosis and menopause Osteoporosis is a disease in which the bones lose minerals and strength with aging. This can result in bone fractures. If you are 3 years old or older, or if you are at risk for osteoporosis and fractures, ask your health care provider if you should: Be screened for bone loss. Take a calcium or vitamin D supplement to lower your risk of fractures. Be given hormone replacement therapy (HRT) to treat symptoms of menopause. Follow these instructions at home: Alcohol use Do not drink alcohol if: Your health care provider tells you not to drink. You are pregnant, may be pregnant, or are planning to become pregnant. If you drink alcohol: Limit how much you have to: 0-1 drink a day. Know how much alcohol is in your drink. In the [place] one drink equals one 12 oz bottle of beer (355 mL), one 5 oz glass of wine (148 mL), or one 1 oz glass of hard liquor (44 mL). Lifestyle Do not use any products that contain nicotine or tobacco. These products include cigarettes, chewing  tobacco, and vaping devices, such as e-cigarettes. If you need help quitting, ask your health care provider. Do not use street drugs. Do not share needles. Ask your health care provider for help if you need support or information about quitting drugs. General instructions Schedule regular health, dental, and eye exams. Stay current with your vaccines. Tell your health care provider if: You often feel depressed. You have ever been abused or do not feel safe at home. Summary Adopting a healthy lifestyle and getting preventive care are important in promoting health and wellness. Follow your health care provider's instructions about healthy diet, exercising, and getting tested or screened for diseases. Follow your health care provider's instructions on monitoring your cholesterol and blood pressure. This information is not intended to replace advice given to you by your health care provider. Make sure you discuss any questions you have with your health care provider. Document Revised: 02/17/2021 Document Reviewed: 02/17/2021 Elsevier Patient Education  Judith Moore.

## 2022-09-22 NOTE — Assessment & Plan Note (Signed)
Continue nonpharmacologic treatment. Further recommendation will be given according to lipid panel result.

## 2022-09-22 NOTE — Assessment & Plan Note (Signed)
Continue current dose of vitamin D supplementation. Further recommendation will be given according to 25 OH vitamin D result. 

## 2022-09-22 NOTE — Assessment & Plan Note (Signed)
Continue daily multivitamin with iron. Further recommendation will be given according to CBC and iron studies results.

## 2022-09-22 NOTE — Assessment & Plan Note (Signed)
We discussed the importance of regular physical activity and healthy diet for prevention of chronic illness and/or complications. Preventive guidelines reviewed. Vaccination: Declines flu vaccine, Tdap Today. Continue her female preventive care with gynecologist. Next CPE in a year.

## 2022-11-09 ENCOUNTER — Ambulatory Visit: Payer: No Typology Code available for payment source | Admitting: Internal Medicine

## 2023-02-01 ENCOUNTER — Ambulatory Visit: Payer: Commercial Managed Care - PPO | Admitting: Internal Medicine

## 2023-02-01 ENCOUNTER — Encounter: Payer: Self-pay | Admitting: Internal Medicine

## 2023-02-01 VITALS — BP 110/72 | HR 70 | Ht 66.0 in | Wt 205.8 lb

## 2023-02-01 DIAGNOSIS — R635 Abnormal weight gain: Secondary | ICD-10-CM

## 2023-02-01 DIAGNOSIS — E0590 Thyrotoxicosis, unspecified without thyrotoxic crisis or storm: Secondary | ICD-10-CM

## 2023-02-01 DIAGNOSIS — E0500 Thyrotoxicosis with diffuse goiter without thyrotoxic crisis or storm: Secondary | ICD-10-CM

## 2023-02-01 NOTE — Progress Notes (Unsigned)
Name: Judith Moore  MRN/ DOB: [id number], November 08, 1985    Age/ Sex: 37 y.o., female     PCP: [place], Betty G, MD   Reason for Endocrinology Evaluation: Hyperthyroidism     Initial Endocrinology Clinic Visit: 08/22/2015    PATIENT IDENTIFIER: Judith Moore is a 37 y.o., female with a past medical history of hyperthyroidism, dyslipidemia, S/P bariatric sx   . She has followed with [facility] since 08/22/2015 for consultative assistance with management of her Hyperthyroidism.   HISTORICAL SUMMARY: The patient was first diagnosed with hyperthyroidism in 2011 due to Graves' disease. She was on Methimazole until 2018 but it was resumed in 2022 with a TSH 0.03 uIU/mL   Paternal grandmother with Graves' disease Mother with goiter    She was followed by Dr. Everardo All from 2016 until 02/2022  SUBJECTIVE:    Today (02/01/2023):  Judith Moore is here for a follow up on hyperthyroidism   She was been noted with weight gain  She self discontinued methimazole sometime in 10/2022 that she attributes to depression  Denies local neck swelling  Has occasional palpitations  Denies tremors  Has chronic constipation  Continues with burning and itching of the eyes - last exam 11/2021  Pt is concerned about her weight gain, she is S/P gastric sleeve ~ 8 yrs ago with weight  regain, she does admit to having cravings for sweets lately , she continues to walk    Methimazole 5 mg , 2 tabs daily  - not taking  Atenolol 25 mg - three days a week      HISTORY:  Past Medical History:  Past Medical History:  Diagnosis Date   Allergies    Anxiety    Gallstones    Hyperthyroidism    Past Surgical History:  Past Surgical History:  Procedure Laterality Date   ADENOIDECTOMY     CESAREAN SECTION  09/28/2005, 11/28/2007   CHOLECYSTECTOMY  04/2021   LAPAROSCOPIC APPENDECTOMY N/A 01/06/2016   Procedure: APPENDECTOMY LAPAROSCOPIC;  Surgeon: Luretha Murphy, MD;  Location: [facility];  Service: General;  Laterality: N/A;   stomach banding     TONSILLECTOMY     Social History:  reports that she has never smoked. She has never used smokeless tobacco. She reports current alcohol use. She reports current drug use. Drug: Marijuana. Family History:  Family History  Problem Relation Age of Onset   Cancer Paternal Grandfather    Diabetes Paternal Grandmother    Hypertension Maternal Grandmother    Hypertension Maternal Grandfather    Kidney failure Maternal Grandfather    Hypertension Mother    Thyroid disease Mother        multinodular goiter   Healthy Brother        x1   Diabetes Maternal Uncle    Heart disease Neg Hx    Colon cancer Neg Hx      HOME MEDICATIONS: Allergies as of 02/01/2023       Reactions   Almond (diagnostic) Hives, Swelling   Almonds        Medication List        Accurate as of February 01, 2023  2:45 PM. If you have any questions, ask your nurse or doctor.          STOP taking these medications    ferrous sulfate 325 (65 FE) MG tablet Stopped by: Scarlette Shorts, MD       TAKE these medications    atenolol 25 MG  tablet Commonly known as: TENORMIN Take 1 tablet (25 mg total) by mouth daily.   cetirizine 10 MG tablet Commonly known as: ZyrTEC Allergy Take 1 tablet (10 mg total) by mouth daily.   fluocinonide gel 0.05 % Commonly known as: LIDEX Apply 1 application topically 2 (two) times daily.   fluticasone 50 MCG/ACT nasal spray Commonly known as: Flonase Place 1 spray into both nostrils daily.   hydrOXYzine 25 MG tablet Commonly known as: ATARAX Take 1-2 tablets (25-50 mg total) by mouth daily as needed for anxiety.   ibuprofen 800 MG tablet Commonly known as: ADVIL Take 1 tablet (800 mg total) by mouth every 8 (eight) hours as needed.   ketoconazole 2 % shampoo Commonly known as: NIZORAL APPLY 1 APPLICATION TOPICALLY 2 (TWO) TIMES A WEEK.   methimazole 5 MG tablet Commonly known as:  TAPAZOLE Take 2 tablets (10 mg total) by mouth daily.   MULTIVITAMIN PO Take 1 tablet by mouth daily at 6 (six) AM.   multivitamin tablet Take 1 tablet by mouth daily.   senna 8.6 MG tablet Commonly known as: SENOKOT Take 1 tablet by mouth daily as needed for constipation.   Vitamin D (Ergocalciferol) 1.25 MG (50000 UNIT) Caps capsule Commonly known as: DRISDOL Take 1 capsule (50,000 Units total) by mouth every 14 (fourteen) days.          OBJECTIVE:   PHYSICAL EXAM: VS: BP 110/72 (BP Location: Right Arm, Patient Position: Sitting, Cuff Size: Large)   Pulse 70   Ht 5\' 6"  (1.676 m)   Wt 205 lb 12.8 oz (93.4 kg)   SpO2 94%   BMI 33.22 kg/m    EXAM: General: Pt appears well and is in NAD  Eyes: External eye exam normal without stare, lid lag or exophthalmos.  Neck: General: Supple without adenopathy. Thyroid: Prominent thyroid gland  Lungs: Clear with good BS bilat   Heart: Auscultation: RRR.  Abdomen: soft, nontender  Extremities:  BL LE: No pretibial edema   Mental Status: Judgment, insight: Intact Orientation: Oriented to time, place, and person Mood and affect: No depression, anxiety, or agitation       DATA REVIEWED:  ***   Latest Reference Range & Units 11/26/21 08:00  Sodium 135 - 145 mEq/L 139  Potassium 3.5 - 5.1 mEq/L 4.1  Chloride 96 - 112 mEq/L 106  CO2 19 - 32 mEq/L 29  Glucose 70 - 99 mg/dL 78  BUN 6 - 23 mg/dL 11  Creatinine 1.02 - 7.25 mg/dL 3.66  Calcium 8.4 - 44.0 mg/dL 8.9  Alkaline Phosphatase 39 - 117 U/L 40  Albumin 3.5 - 5.2 g/dL 3.9  AST 0 - 37 U/L 14  ALT 0 - 35 U/L 13  Total Protein 6.0 - 8.3 g/dL 6.8  Total Bilirubin 0.2 - 1.2 mg/dL 0.6  GFR >34.74 mL/min 73.42     ASSESSMENT / PLAN / RECOMMENDATIONS:   Hyperthyroidism   - Pt is clinically euthyroid - No local neck symptoms  -She is tends to self adjust her methimazole, on her last visit she self increased the dose, on today's visit she self discontinued  approximately 3 months ago    Medications  Methimazole 5 mg, 2 tabs-not taking    2. Graves' Disease:   - Pt with burning and itching sensation of the eyes - She follows with optometry  3.  Weight gain:   -I referral has been placed to the healthy weight and wellness clinic  F/U in 4 months  Signed electronically by: Lyndle Herrlich, MD  [facility]  [facility] [address], [address] [place], [place] [address] Phone: [phone] FAX: [phone]      CC: [place], Betty G, MD [address] [place] [address] Phone: [phone]  Fax: [phone]   Return to Endocrinology clinic as below: No future appointments.

## 2023-02-02 LAB — TSH: TSH: 0.32 u[IU]/mL — ABNORMAL LOW (ref 0.35–5.50)

## 2023-02-02 LAB — T3: T3, Total: 110 ng/dL (ref 76–181)

## 2023-02-02 LAB — T4, FREE: Free T4: 1.09 ng/dL (ref 0.60–1.60)

## 2023-03-07 ENCOUNTER — Other Ambulatory Visit: Payer: Self-pay | Admitting: Family Medicine

## 2023-03-07 DIAGNOSIS — F419 Anxiety disorder, unspecified: Secondary | ICD-10-CM

## 2023-03-31 NOTE — Progress Notes (Unsigned)
ACUTE VISIT Chief Complaint  Patient presents with   Depression   HPI: [name] is a 37 y.o. female with PMHx significant for GERD,hypothyroidism, obesity s/p bariatric surgery,anxiety disorder, and allergies here today complaining of *** Depression        Past medical history includes thyroid problem and anxiety.   The patient presents today with a chief complaint of depression, which has been ongoing for the past six to eight months. She has a history of anxiety and has previously experienced episodes of depression. Multiple stressors are contributing to her current depressive state, including job loss, family issues, and recent deaths in the family. She has been struggling with motivation and energy, which has impacted her ability to work and engage in daily activities.  The patient's family situation has been a significant source of stress, with her ex-husband manipulating their children and spreading negativity about her. She has been the primary caregiver for her children and has recently had them return to live with her after a period of living with their father.  Over the past six to eight months, she has experienced significant weight gain, which has further exacerbated her feelings of depression and low self-esteem. Despite a history of successful weight loss and undergoing tummy tuck surgery, she has been unable to maintain her weight due to her current emotional state. The patient reports emotional eating and consuming excessive amounts of sweets.  While the patient has a history of attending therapy for her mental health issues, she had to discontinue due to a lack of insurance coverage. She plans to find a therapist within her new insurance network and engage in family therapy with her children. The patient has previously taken Sertraline for depression but discontinued due to nausea. She has also been prescribed Atenolol for heart palpitations related to her Grave's  disease, which she reports helped stabilize her mood and anxiety.  Additionally, the patient is dealing with the stress of her grandmother's recent hospitalization and subsequent cognitive decline following knee surgery.    04/02/2023    7:32 AM 09/22/2022    8:34 AM 11/25/2021    2:24 PM 08/06/2021    2:33 PM 05/07/2021    9:45 PM  Depression screen PHQ 2/9  Decreased Interest 2 1 0 0 3  Down, Depressed, Hopeless 2 1 0 0 2  PHQ - 2 Score 4 2 0 0 5  Altered sleeping 0 1 0  3  Tired, decreased energy 0 1 0  3  Change in appetite 0 1 0  2  Feeling bad or failure about yourself  1 0 0  2  Trouble concentrating 1 0 0  0  Moving slowly or fidgety/restless 0 0 0  0  Suicidal thoughts 0 0 0  0  PHQ-9 Score 6 5 0  15  Difficult doing work/chores Somewhat difficult Not difficult at all   Somewhat difficult    Review of Systems  Psychiatric/Behavioral:  Positive for depression.   See other pertinent positives and negatives in HPI.  Current Outpatient Medications on File Prior to Visit  Medication Sig Dispense Refill   atenolol (TENORMIN) 25 MG tablet Take 1 tablet (25 mg total) by mouth daily. 90 tablet 3   cetirizine (ZYRTEC ALLERGY) 10 MG tablet Take 1 tablet (10 mg total) by mouth daily. (Patient not taking: Reported on 02/01/2023) 30 tablet 11   fluocinonide gel (LIDEX) 0.05 % Apply 1 application topically 2 (two) times daily. (Patient not taking: Reported on  02/01/2023) 60 g 0   fluticasone (FLONASE) 50 MCG/ACT nasal spray Place 1 spray into both nostrils daily. (Patient not taking: Reported on 02/01/2023) 16 g PRN   hydrOXYzine (ATARAX) 25 MG tablet TAKE 1-2 TABLETS BY MOUTH DAILY AS NEEDED FOR ANXIETY. 30 tablet 2   ibuprofen (ADVIL) 800 MG tablet Take 1 tablet (800 mg total) by mouth every 8 (eight) hours as needed. (Patient not taking: Reported on 02/01/2023) 30 tablet 5   ketoconazole (NIZORAL) 2 % shampoo APPLY 1 APPLICATION TOPICALLY 2 (TWO) TIMES A WEEK. (Patient not taking: Reported  on 02/01/2023) 120 mL 0   Multiple Vitamin (MULTIVITAMIN PO) Take 1 tablet by mouth daily at 6 (six) AM.     Multiple Vitamin (MULTIVITAMIN) tablet Take 1 tablet by mouth daily. (Patient not taking: Reported on 02/01/2023)     senna (SENOKOT) 8.6 MG tablet Take 1 tablet by mouth daily as needed for constipation. (Patient not taking: Reported on 02/01/2023)     Vitamin D, Ergocalciferol, (DRISDOL) 1.25 MG (50000 UNIT) CAPS capsule Take 1 capsule (50,000 Units total) by mouth every 14 (fourteen) days. (Patient not taking: Reported on 02/01/2023) 6 capsule 1   No current facility-administered medications on file prior to visit.   Past Medical History:  Diagnosis Date   Allergies    Anxiety    Gallstones    Hyperthyroidism    Allergies  Allergen Reactions   Almond (Diagnostic) Hives and Swelling    Almonds    Social History   Socioeconomic History   Marital status: Divorced    Spouse name: SEPARATED   Number of children: 2   Years of education: 15+   Highest education level: Not on file  Occupational History   Occupation: HEALTH CARE BILLING SPECIALIST    Employer: [employer]  Tobacco Use   Smoking status: Never   Smokeless tobacco: Never  Vaping Use   Vaping Use: Never used  Substance and Sexual Activity   Alcohol use: Yes    Comment: occasional   Drug use: Yes    Types: Marijuana   Sexual activity: Yes    Partners: Male    Birth control/protection: Pill    Comment: Mirena  Other Topics Concern   Not on file  Social History Narrative   Lives with her two sons, her uncle and cousin. Separated from husband 11/2010.   Social Determinants of Health   Financial Resource Strain: Not on file  Food Insecurity: Not on file  Transportation Needs: Not on file  Physical Activity: Not on file  Stress: Not on file  Social Connections: Not on file   Vitals:   04/02/23 0726  BP: 120/80  Pulse: 80  Resp: 12  Temp: 98.3 F (36.8 C)  SpO2: 99%   Wt Readings from Last 3  Encounters:  04/02/23 206 lb 2 oz (93.5 kg)  02/01/23 205 lb 12.8 oz (93.4 kg)  09/22/22 191 lb (86.6 kg)    Body mass index is 33.27 kg/m.  Physical Exam Vitals and nursing note reviewed.  Constitutional:      General: She is not in acute distress.    Appearance: She is well-developed.  HENT:     Head: Normocephalic and atraumatic.  Eyes:     Conjunctiva/sclera: Conjunctivae normal.  Cardiovascular:     Rate and Rhythm: Normal rate and regular rhythm.     Heart sounds: No murmur heard. Pulmonary:     Effort: Pulmonary effort is normal. No respiratory distress.  Breath sounds: Normal breath sounds.  Skin:    General: Skin is warm.     Findings: No erythema or rash.  Neurological:     General: No focal deficit present.     Mental Status: She is alert and oriented to person, place, and time.     Gait: Gait normal.  Psychiatric:        Speech: Speech normal.        Thought Content: Thought content does not include suicidal ideation. Thought content does not include suicidal plan.     ASSESSMENT AND PLAN: Mild recurrent major depression (HCC) -     buPROPion HCl ER (SR); Take 1 tablet (150 mg total) by mouth 2 (two) times daily.  Dispense: 60 tablet; Refill: 1  Class 1 obesity due to excess calories with serious comorbidity and body mass index (BMI) of 31.0 to 31.9 in adult -     Amb Ref to Medical Weight Management    Return in about 6 weeks (around 05/14/2023).  Aramis Weil G. [place], MD  [facility] [place] office.  Discharge Instructions   None

## 2023-04-02 ENCOUNTER — Encounter: Payer: Self-pay | Admitting: Family Medicine

## 2023-04-02 ENCOUNTER — Ambulatory Visit (INDEPENDENT_AMBULATORY_CARE_PROVIDER_SITE_OTHER): Payer: Commercial Managed Care - PPO | Admitting: Family Medicine

## 2023-04-02 VITALS — BP 120/80 | HR 80 | Temp 98.3°F | Resp 12 | Ht 66.0 in | Wt 206.1 lb

## 2023-04-02 DIAGNOSIS — Z6831 Body mass index (BMI) 31.0-31.9, adult: Secondary | ICD-10-CM | POA: Diagnosis not present

## 2023-04-02 DIAGNOSIS — F330 Major depressive disorder, recurrent, mild: Secondary | ICD-10-CM

## 2023-04-02 DIAGNOSIS — F331 Major depressive disorder, recurrent, moderate: Secondary | ICD-10-CM

## 2023-04-02 DIAGNOSIS — F419 Anxiety disorder, unspecified: Secondary | ICD-10-CM

## 2023-04-02 DIAGNOSIS — E6609 Other obesity due to excess calories: Secondary | ICD-10-CM

## 2023-04-02 MED ORDER — BUPROPION HCL ER (SR) 150 MG PO TB12
150.0000 mg | ORAL_TABLET | Freq: Two times a day (BID) | ORAL | 1 refills | Status: DC
Start: 2023-04-02 — End: 2023-04-09

## 2023-04-02 NOTE — Patient Instructions (Addendum)
A few things to remember from today's visit:  Mild recurrent major depression (HCC) - Plan: buPROPion (WELLBUTRIN SR) 150 MG 12 hr tablet Wellbutrin daily for a week then 2 times daily. I will see you back in 6 weeks.  If you need refills for medications you take chronically, please call your pharmacy. Do not use My Chart to request refills or for acute issues that need immediate attention. If you send a my chart message, it may take a few days to be addressed, specially if I am not in the office.  Please be sure medication list is accurate. If a new problem present, please set up appointment sooner than planned today.

## 2023-04-04 NOTE — Assessment & Plan Note (Signed)
S/P bariatric procedure. Has decreased physical activity and increase oral intake due to anxiety and depression. She has gained about 14-15 Lb since 09/2022. She would like referral to wt management clinic.

## 2023-04-04 NOTE — Assessment & Plan Note (Signed)
Problem is getting worse. We discussed treatment options. She is concerned about effects of medications on her wt, so recommend trying Wellbutrin. We discussed some side effects. Wellbutrin SR 150 mg daily for a week then bid. Pending CBT. Instructed about warning signs. F/U in 6 weeks.

## 2023-04-04 NOTE — Assessment & Plan Note (Addendum)
She does not feel like it is as bad as her depression. Wellbutrin may aggravate problem. We may add a low dose of SSRI's next visit if needed. Planning on establishing with psychotherapist. F/U in 6 weeks, before if needed.

## 2023-04-08 ENCOUNTER — Encounter: Payer: Self-pay | Admitting: Family Medicine

## 2023-04-09 ENCOUNTER — Ambulatory Visit (INDEPENDENT_AMBULATORY_CARE_PROVIDER_SITE_OTHER): Payer: Commercial Managed Care - PPO | Admitting: Family Medicine

## 2023-04-09 ENCOUNTER — Encounter: Payer: Self-pay | Admitting: Family Medicine

## 2023-04-09 VITALS — BP 124/80 | HR 76 | Resp 12 | Ht 66.0 in | Wt 205.2 lb

## 2023-04-09 DIAGNOSIS — T783XXA Angioneurotic edema, initial encounter: Secondary | ICD-10-CM

## 2023-04-09 DIAGNOSIS — H6502 Acute serous otitis media, left ear: Secondary | ICD-10-CM

## 2023-04-09 DIAGNOSIS — F331 Major depressive disorder, recurrent, moderate: Secondary | ICD-10-CM

## 2023-04-09 MED ORDER — AMOXICILLIN-POT CLAVULANATE 875-125 MG PO TABS
1.0000 | ORAL_TABLET | Freq: Two times a day (BID) | ORAL | 0 refills | Status: AC
Start: 2023-04-09 — End: 2023-04-19

## 2023-04-09 MED ORDER — PREDNISONE 20 MG PO TABS
40.0000 mg | ORAL_TABLET | Freq: Every day | ORAL | 0 refills | Status: AC
Start: 2023-04-09 — End: 2023-04-14

## 2023-04-09 MED ORDER — SERTRALINE HCL 25 MG PO TABS
25.0000 mg | ORAL_TABLET | Freq: Every day | ORAL | 0 refills | Status: DC
Start: 2023-04-09 — End: 2023-06-21

## 2023-04-09 NOTE — Patient Instructions (Addendum)
A few things to remember from today's visit:  Angioedema, initial encounter - Plan: predniSONE (DELTASONE) 20 MG tablet  Non-recurrent acute serous otitis media of left ear - Plan: amoxicillin-clavulanate (AUGMENTIN) 875-125 MG tablet  Depression, major, recurrent, moderate (HCC) - Plan: sertraline (ZOLOFT) 25 MG tablet Sertraline 25 mg daily. Prednisone to be taken with breakfast and for 3-5 days.  If you need refills for medications you take chronically, please call your pharmacy. Do not use My Chart to request refills or for acute issues that need immediate attention. If you send a my chart message, it may take a few days to be addressed, specially if I am not in the office.  Please be sure medication list is accurate. If a new problem present, please set up appointment sooner than planned today.

## 2023-04-09 NOTE — Progress Notes (Unsigned)
ACUTE VISIT Chief Complaint  Patient presents with   Rash    Started on Wednesday, only new thing was the wellbutrin.    Otitis Media   HPI: Judith Moore is a 37 y.o. female, who is here today complaining of lower lip edema and tingling sensation. She believes is due to Wellbutrin, initiated on Monday. By Wednesday, she noticed her mouth feeling "weird" and her lips becoming reddish. On Thursday, her tongue and mouth became pruritic and lower lip edema was more evident. Negative for sore throat, stridor, dysphagia,cough,SOB,or wheezing.  She took benadryl.  She few hives on her arms that have since resolved.   She is also experiencing left ear pain and believes she has an ear infection. Both ears have been hurting for about a week but left one is worse.  Otalgia  There is pain in the right ear. This is a new problem. The current episode started in the past 7 days. There has been no fever. The pain is moderate. Pertinent negatives include no abdominal pain, coughing, diarrhea, ear discharge, headaches, hearing loss, neck pain, rhinorrhea, sore throat or vomiting. She has tried nothing for the symptoms. There is no history of a chronic ear infection or hearing loss.   Left earache is radiated to her jaw and a sensation of being "clogged" and intermittent popping sensations and dizziness when blowing her nose.  "Scratchy" throat" today. No sick contact.  She denies any recent upper respiratory infection or travel.  Review of Systems  Constitutional:  Negative for activity change, appetite change, chills and fever.  HENT:  Positive for congestion and ear pain. Negative for ear discharge, hearing loss, rhinorrhea and sore throat.   Respiratory:  Negative for cough.   Cardiovascular:  Negative for chest pain, palpitations and leg swelling.  Gastrointestinal:  Negative for abdominal pain, diarrhea and vomiting.  Genitourinary:  Negative for decreased urine volume and hematuria.   Musculoskeletal:  Negative for myalgias and neck pain.  Neurological:  Negative for syncope and headaches.  See other pertinent positives and negatives in HPI.  Current Outpatient Medications on File Prior to Visit  Medication Sig Dispense Refill   atenolol (TENORMIN) 25 MG tablet Take 1 tablet (25 mg total) by mouth daily. 90 tablet 3   hydrOXYzine (ATARAX) 25 MG tablet TAKE 1-2 TABLETS BY MOUTH DAILY AS NEEDED FOR ANXIETY. 30 tablet 2   Multiple Vitamin (MULTIVITAMIN PO) Take 1 tablet by mouth daily at 6 (six) AM.     No current facility-administered medications on file prior to visit.   Past Medical History:  Diagnosis Date   Allergies    Anxiety    Gallstones    Hyperthyroidism    Allergies  Allergen Reactions   Almond (Diagnostic) Hives and Swelling    Almonds   Wellbutrin [Bupropion] Hives    Lip    Social History   Socioeconomic History   Marital status: Divorced    Spouse name: SEPARATED   Number of children: 2   Years of education: 15+   Highest education level: Not on file  Occupational History   Occupation: HEALTH CARE BILLING SPECIALIST    Employer: [employer]  Tobacco Use   Smoking status: Never   Smokeless tobacco: Never  Vaping Use   Vaping Use: Never used  Substance and Sexual Activity   Alcohol use: Yes    Comment: occasional   Drug use: Yes    Types: Marijuana   Sexual activity: Yes    Partners:  Male    Birth control/protection: Pill    Comment: Mirena  Other Topics Concern   Not on file  Social History Narrative   Lives with her two sons, her uncle and cousin. Separated from husband 11/2010.   Social Determinants of Health   Financial Resource Strain: Not on file  Food Insecurity: Not on file  Transportation Needs: Not on file  Physical Activity: Not on file  Stress: Not on file  Social Connections: Not on file    Vitals:   04/09/23 1231  BP: 124/80  Pulse: 76  Resp: 12   Body mass index is 33.13 kg/m.  Physical  Exam Vitals and nursing note reviewed.  Constitutional:      General: She is not in acute distress.    Appearance: She is well-developed. She is not ill-appearing.  HENT:     Head: Normocephalic and atraumatic.     Right Ear: Tympanic membrane, ear canal and external ear normal.     Left Ear: Ear canal and external ear normal. A middle ear effusion is present. Tympanic membrane is erythematous and bulging.     Nose: Rhinorrhea present.     Right Sinus: No maxillary sinus tenderness or frontal sinus tenderness.     Left Sinus: No maxillary sinus tenderness or frontal sinus tenderness.     Mouth/Throat:     Mouth: Mucous membranes are moist.     Tongue: No lesions.     Pharynx: Oropharynx is clear. Uvula midline. No pharyngeal swelling, posterior oropharyngeal erythema or uvula swelling.  Eyes:     Conjunctiva/sclera: Conjunctivae normal.  Cardiovascular:     Rate and Rhythm: Normal rate and regular rhythm.     Heart sounds: No murmur heard. Pulmonary:     Effort: Pulmonary effort is normal. No respiratory distress.     Breath sounds: Normal breath sounds. No stridor.  Musculoskeletal:     Cervical back: No edema or erythema. No muscular tenderness.  Lymphadenopathy:     Head:     Right side of head: No submandibular adenopathy.     Left side of head: No submandibular adenopathy.     Cervical: No cervical adenopathy.  Skin:    General: Skin is warm.     Findings: No erythema or rash.  Neurological:     Mental Status: She is alert and oriented to person, place, and time.  Psychiatric:        Mood and Affect: Affect normal.     Comments: Well groomed, good eye contact.   ASSESSMENT AND PLAN:  [name] was seen today for angioedema and left earache.  Angioedema, initial encounter Improved with Benadryl. Wellbutrin discontinued. Prednisone with breakfast x 3-5 days with breakfast. Clearly instructed about warning signs.  -     predniSONE; Take 2 tablets (40 mg total) by  mouth daily with breakfast for 5 days.  Dispense: 10 tablet; Refill: 0  Non-recurrent acute serous otitis media of left ear For 7 days, so abx treatment recommended. Augmentin x 10 days, some side effects discussed. Auto inflation maneuvers a few times per day to help with eustachian tube dysfunction.  -     Amoxicillin-Pot Clavulanate; Take 1 tablet by mouth 2 (two) times daily for 10 days.  Dispense: 20 tablet; Refill: 0  Depression, major, recurrent, moderate (HCC) Assessment & Plan: Stop Wellbutrin, added to her allergy med list. She has tried Sertraline in the past and agrees with trying again, starting with 25 mg daily. Some side effects  discussed. Pending to start CBT. Instructed about warning signs. F/U in 4-6 weeks,before if needed.  Orders: -     Sertraline HCl; Take 1 tablet (25 mg total) by mouth daily.  Dispense: 90 tablet; Refill: 0  Return if symptoms worsen or fail to improve, for keep next appointment.  Lorena Benham G. [place], MD  [facility]. [place] office.

## 2023-04-10 NOTE — Assessment & Plan Note (Addendum)
Stop Wellbutrin, added to her allergy med list. She has tried Sertraline in the past and agrees with trying again, starting with 25 mg daily. Some side effects discussed. Pending to start CBT. Instructed about warning signs. F/U in 4-6 weeks,before if needed.

## 2023-04-19 ENCOUNTER — Encounter: Payer: Self-pay | Admitting: Family Medicine

## 2023-04-19 MED ORDER — FLUCONAZOLE 150 MG PO TABS: 150.0000 mg | ORAL_TABLET | Freq: Once | ORAL | 0 refills | Status: AC

## 2023-04-19 NOTE — Telephone Encounter (Signed)
Can you advise in pcp's absence? Antibiotic was prescribed by pcp.

## 2023-05-12 ENCOUNTER — Encounter (INDEPENDENT_AMBULATORY_CARE_PROVIDER_SITE_OTHER): Payer: 59 | Admitting: Family Medicine

## 2023-06-21 ENCOUNTER — Encounter: Payer: Self-pay | Admitting: Obstetrics and Gynecology

## 2023-06-21 ENCOUNTER — Other Ambulatory Visit (HOSPITAL_COMMUNITY)
Admission: RE | Admit: 2023-06-21 | Discharge: 2023-06-21 | Disposition: A | Discharge status: Home / Self Care | Payer: Commercial Managed Care - PPO | Source: Ambulatory Visit | Attending: Obstetrics and Gynecology | Admitting: Obstetrics and Gynecology

## 2023-06-21 ENCOUNTER — Ambulatory Visit (INDEPENDENT_AMBULATORY_CARE_PROVIDER_SITE_OTHER): Payer: Commercial Managed Care - PPO | Admitting: Obstetrics and Gynecology

## 2023-06-21 VITALS — BP 132/86 | HR 64 | Ht 66.0 in | Wt 204.0 lb

## 2023-06-21 DIAGNOSIS — F3281 Premenstrual dysphoric disorder: Secondary | ICD-10-CM | POA: Diagnosis not present

## 2023-06-21 DIAGNOSIS — Z01419 Encounter for gynecological examination (general) (routine) without abnormal findings: Secondary | ICD-10-CM

## 2023-06-21 DIAGNOSIS — Z1339 Encounter for screening examination for other mental health and behavioral disorders: Secondary | ICD-10-CM

## 2023-06-21 DIAGNOSIS — N760 Acute vaginitis: Secondary | ICD-10-CM

## 2023-06-21 DIAGNOSIS — Z113 Encounter for screening for infections with a predominantly sexual mode of transmission: Secondary | ICD-10-CM | POA: Insufficient documentation

## 2023-06-21 DIAGNOSIS — B9689 Other specified bacterial agents as the cause of diseases classified elsewhere: Secondary | ICD-10-CM

## 2023-06-21 MED ORDER — FLUOXETINE HCL 10 MG PO CAPS
10.0000 mg | ORAL_CAPSULE | Freq: Every day | ORAL | 3 refills | Status: DC
Start: 2023-06-21 — End: 2024-05-05

## 2023-06-21 NOTE — Progress Notes (Signed)
Pt presents for AEX. Last PAP 11/2021 Requesting PAP and STD testing  Interested in Mirena, had complications before. Last unprotected sex several weeks ago.

## 2023-06-21 NOTE — Progress Notes (Signed)
ANNUAL EXAM Patient name: ELDON SANTIZO MRN [id number]  Date of birth: [birth year] Chief Complaint:   Annual  History of Present Illness:   ALIESHA KREIE is a 37 y.o. G68P1102  female being seen today for a routine annual exam.  Current complaints: Had a mirena years ago, worked great. Last two times was malpositioned, unsure if she wants it again. Always struggled with pmdd, unsure if the iud or prozac helped as she started both around same time. She is not currently on an SSRI, has tried many in past d/t side effects. Reports positive results with prozac to aid in pmdd and does not remember having side effects. Stopped birth control last year, regular monthly normal flow cycles.  Patient's last menstrual period was 05/27/2023.   The pregnancy intention screening data noted above was reviewed. Potential methods of contraception were discussed. The patient elected to proceed with No data recorded.   Last pap 11/25/21. Results were: NILM w/ HRHPV negative. H/O abnormal pap: yes Last mammogram: n/a. Results were: N/A. Family h/o breast cancer: no Last colonoscopy: n/a. Results were: N/A. Family h/o colorectal cancer: no     06/21/2023    8:24 AM 04/02/2023    7:32 AM 09/22/2022    8:34 AM 11/25/2021    2:24 PM 08/06/2021    2:33 PM  Depression screen PHQ 2/9  Decreased Interest 3 2 1  0 0  Down, Depressed, Hopeless 3 2 1  0 0  PHQ - 2 Score 6 4 2  0 0  Altered sleeping 1 1 1  0   Tired, decreased energy 3 2 1  0   Change in appetite 1 2 1  0   Feeling bad or failure about yourself  1 1 0 0   Trouble concentrating 0 1 0 0   Moving slowly or fidgety/restless 0 0 0 0   Suicidal thoughts 0 0 0 0   PHQ-9 Score 12 11 5  0   Difficult doing work/chores  Somewhat difficult Not difficult at all          06/21/2023    8:25 AM 04/02/2023    7:32 AM 11/25/2021    2:25 PM 05/10/2021    9:46 PM  GAD 7 : Generalized Anxiety Score  Nervous, Anxious, on Edge 3 2 1 2   Control/stop worrying 3 1 0 2   Worry too much - different things 3 1 1 2   Trouble relaxing 1 1 0 1  Restless 1 0 0 1  Easily annoyed or irritable 3 0 1 2  Afraid - awful might happen 0 0 0 2  Total GAD 7 Score 14 5 3 12   Anxiety Difficulty  Somewhat difficult  Somewhat difficult     Review of Systems:   Pertinent items are noted in HPI Denies any headaches, blurred vision, fatigue, shortness of breath, chest pain, abdominal pain, abnormal vaginal discharge/itching/odor/irritation, problems with periods, bowel movements, urination, or intercourse unless otherwise stated above. Pertinent History Reviewed:  Reviewed past medical,surgical, social and family history.  Reviewed problem list, medications and allergies. Physical Assessment:   Vitals:   06/21/23 0819  BP: 132/86  Pulse: 64  Weight: 204 lb (92.5 kg)  Height: 5\' 6"  (1.676 m)  Body mass index is 32.93 kg/m.        Physical Examination:   General appearance - well appearing, and in no distress  Mental status - alert, oriented to person, place, and time  Psych:  She has a normal mood and affect  Skin - warm and dry, normal color, no suspicious lesions noted  Chest - effort normal, all lung fields clear to auscultation bilaterally  Heart - normal rate and regular rhythm  Neck:  midline trachea, no thyromegaly or nodules  Breasts - breasts appear normal, no suspicious masses, no skin or nipple changes or  axillary nodes  Abdomen - soft, nontender, nondistended, no masses or organomegaly  Pelvic - VULVA: normal appearing vulva with no masses, tenderness or lesions  VAGINA: normal appearing vagina with normal color and discharge, no lesions  CERVIX: normal appearing cervix without discharge or lesions, no CMT  UTERUS: uterus is felt to be normal size, shape, consistency and nontender   ADNEXA: No adnexal masses or tenderness noted.    Extremities:  No swelling or varicosities noted  Chaperone present for exam  No results found for this or any previous  visit (from the past 24 hour(s)).  Assessment & Plan:   1. Well woman exam  2. Premenstrual dysphoric disorder Discussed tx options, COC, SSRI. Tolerated prozac years ago would like to try prozac again. Start - FLUoxetine (PROZAC) 10 MG capsule; Take 1 capsule (10 mg total) by mouth daily.  Dispense: 30 capsule; Refill: 3  3. Screen for STD (sexually transmitted disease)  - Cervicovaginal ancillary only( [facility]) - HIV antibody (with reflex) - RPR - Hepatitis B Surface AntiGEN - Hepatitis C Antibody  Mammogram: @ 38yo, or sooner if problems Colonoscopy: @ 38yo, or sooner if problems  Orders Placed This Encounter  Procedures   HIV antibody (with reflex)   RPR   Hepatitis B Surface AntiGEN   Hepatitis C Antibody    Meds:  Meds ordered this encounter  Medications   FLUoxetine (PROZAC) 10 MG capsule    Sig: Take 1 capsule (10 mg total) by mouth daily.    Dispense:  30 capsule    Refill:  3    Follow-up: Return in about 2 months (around 08/21/2023), or medication follow up.   Albertine Grates, FNP

## 2023-06-22 LAB — HEPATITIS C ANTIBODY: Hep C Virus Ab: NONREACTIVE

## 2023-06-22 LAB — CERVICOVAGINAL ANCILLARY ONLY
Bacterial Vaginitis (gardnerella): POSITIVE — AB
Candida Glabrata: NEGATIVE
Candida Vaginitis: NEGATIVE
Chlamydia: NEGATIVE
Comment: NEGATIVE
Comment: NEGATIVE
Comment: NEGATIVE
Comment: NEGATIVE
Comment: NEGATIVE
Comment: NORMAL
Neisseria Gonorrhea: NEGATIVE
Trichomonas: NEGATIVE

## 2023-06-22 LAB — HIV ANTIBODY (ROUTINE TESTING W REFLEX): HIV Screen 4th Generation wRfx: NONREACTIVE

## 2023-06-22 LAB — RPR: RPR Ser Ql: NONREACTIVE

## 2023-06-22 LAB — HEPATITIS B SURFACE ANTIGEN: Hepatitis B Surface Ag: NEGATIVE

## 2023-06-23 MED ORDER — METRONIDAZOLE 500 MG PO TABS
500.0000 mg | ORAL_TABLET | Freq: Two times a day (BID) | ORAL | 0 refills | Status: AC
Start: 2023-06-23 — End: 2023-06-30

## 2023-06-23 NOTE — Addendum Note (Signed)
Addended by: Sue Lush on: 06/23/2023 08:16 AM   Modules accepted: Orders

## 2023-07-28 ENCOUNTER — Other Ambulatory Visit: Payer: Self-pay | Admitting: Obstetrics

## 2023-08-03 ENCOUNTER — Encounter: Payer: Self-pay | Admitting: Internal Medicine

## 2023-08-03 ENCOUNTER — Ambulatory Visit (INDEPENDENT_AMBULATORY_CARE_PROVIDER_SITE_OTHER): Payer: Commercial Managed Care - PPO | Admitting: Internal Medicine

## 2023-08-03 VITALS — BP 124/78 | HR 80 | Ht 66.0 in | Wt 203.0 lb

## 2023-08-03 DIAGNOSIS — E0590 Thyrotoxicosis, unspecified without thyrotoxic crisis or storm: Secondary | ICD-10-CM | POA: Diagnosis not present

## 2023-08-03 DIAGNOSIS — E0500 Thyrotoxicosis with diffuse goiter without thyrotoxic crisis or storm: Secondary | ICD-10-CM

## 2023-08-03 LAB — T3, FREE: T3, Free: 3.5 pg/mL (ref 2.3–4.2)

## 2023-08-03 LAB — TSH: TSH: 0.31 u[IU]/mL — ABNORMAL LOW (ref 0.35–5.50)

## 2023-08-03 LAB — T4, FREE: Free T4: 0.82 ng/dL (ref 0.60–1.60)

## 2023-08-03 NOTE — Progress Notes (Unsigned)
Name: Judith Moore  MRN/ DOB: [id number], 1986-01-04    Age/ Sex: 37 y.o., female     PCP: [place], Betty G, MD   Reason for Endocrinology Evaluation: Hyperthyroidism     Initial Endocrinology Clinic Visit: 08/22/2015    PATIENT IDENTIFIER: Judith Moore is a 37 y.o., female with a past medical history of hyperthyroidism, dyslipidemia, S/P bariatric sx   . She has followed with [facility] since 08/22/2015 for consultative assistance with management of her Hyperthyroidism.   HISTORICAL SUMMARY: The patient was first diagnosed with hyperthyroidism in 2011 due to Graves' disease. She was on Methimazole until 2018 but it was resumed in 2022 with a TSH 0.03 uIU/mL   Paternal grandmother with Graves' disease Mother with goiter    She was followed by Dr. Everardo All from 2016 until 02/2022   She self discontinued methimazole in January 2024  SUBJECTIVE:    Today (08/03/2023):  Judith Moore is here for a follow up on hyperthyroidism   Weight has been stable Denies local neck swelling  Continue with occasional palpitations that she attributes to anxiety  Has noted left eye droopy and eye lash eye loss , no recent eye exam Denies tremors  Continues with chronic constipation      HISTORY:  Past Medical History:  Past Medical History:  Diagnosis Date   Allergies    Anxiety    Gallstones    Hyperthyroidism    Past Surgical History:  Past Surgical History:  Procedure Laterality Date   ADENOIDECTOMY     CESAREAN SECTION  09/28/2005, 11/28/2007   CHOLECYSTECTOMY  04/2021   LAPAROSCOPIC APPENDECTOMY N/A 01/06/2016   Procedure: APPENDECTOMY LAPAROSCOPIC;  Surgeon: Luretha Murphy, MD;  Location: [facility];  Service: General;  Laterality: N/A;   stomach banding     TONSILLECTOMY     Social History:  reports that she has never smoked. She has never used smokeless tobacco. She reports current alcohol use. She reports current drug use. Drug:  Marijuana. Family History:  Family History  Problem Relation Age of Onset   Cancer Paternal Grandfather    Diabetes Paternal Grandmother    Hypertension Maternal Grandmother    Hypertension Maternal Grandfather    Kidney failure Maternal Grandfather    Hypertension Mother    Thyroid disease Mother        multinodular goiter   Healthy Brother        x1   Diabetes Maternal Uncle    Heart disease Neg Hx    Colon cancer Neg Hx      HOME MEDICATIONS: Allergies as of 08/03/2023       Reactions   Almond (diagnostic) Hives, Swelling   Almonds   Wellbutrin [bupropion] Hives   Lip        Medication List        Accurate as of August 03, 2023 10:56 AM. If you have any questions, ask your nurse or doctor.          clobetasol ointment 0.05 % Commonly known as: TEMOVATE Apply to affected area in scalp 3-4 times per week. Not to face.   Dapsone 7.5 % Gel Apply to face each morning   FLUoxetine 10 MG capsule Commonly known as: PROZAC Take 1 capsule (10 mg total) by mouth daily.   metroNIDAZOLE 500 MG tablet Commonly known as: FLAGYL TAKE 1 TABLET BY MOUTH TWICE A DAY   MULTIVITAMIN PO Take 1 tablet by mouth daily at 6 (six) AM.  spironolactone 50 MG tablet Commonly known as: ALDACTONE Take 1 tablet by mouth daily.   tretinoin 0.025 % cream Commonly known as: RETIN-A Apply to face nightly to tolerance          OBJECTIVE:   PHYSICAL EXAM: VS: BP 124/78 (BP Location: Left Arm, Patient Position: Sitting, Cuff Size: Large)   Pulse 80   Ht 5\' 6"  (1.676 m)   Wt 203 lb (92.1 kg)   SpO2 99%   BMI 32.77 kg/m    EXAM: General: Pt appears well and is in NAD  Neck: General: Supple without adenopathy. Thyroid: Prominent thyroid gland  Lungs: Clear with good BS bilat   Heart: Auscultation: RRR.  Abdomen: soft, nontender  Extremities:  BL LE: No pretibial edema   Mental Status: Judgment, insight: Intact Orientation: Oriented to time, place, and  person Mood and affect: No depression, anxiety, or agitation       DATA REVIEWED:   Latest Reference Range & Units 02/01/23 15:11  TSH 0.35 - 5.50 uIU/mL 0.32 (L)  Triiodothyronine (T3) 76 - 181 ng/dL 478  G9,FAOZ(HYQMVH) 8.46 - 1.60 ng/dL 9.62  (L): Data is abnormally low   Latest Reference Range & Units 11/26/21 08:00  Sodium 135 - 145 mEq/L 139  Potassium 3.5 - 5.1 mEq/L 4.1  Chloride 96 - 112 mEq/L 106  CO2 19 - 32 mEq/L 29  Glucose 70 - 99 mg/dL 78  BUN 6 - 23 mg/dL 11  Creatinine 9.52 - 8.41 mg/dL 3.24  Calcium 8.4 - 40.1 mg/dL 8.9  Alkaline Phosphatase 39 - 117 U/L 40  Albumin 3.5 - 5.2 g/dL 3.9  AST 0 - 37 U/L 14  ALT 0 - 35 U/L 13  Total Protein 6.0 - 8.3 g/dL 6.8  Total Bilirubin 0.2 - 1.2 mg/dL 0.6  GFR >02.72 mL/min 73.42     ASSESSMENT / PLAN / RECOMMENDATIONS:   Hyperthyroidism   - Pt is clinically euthyroid - No local neck symptoms  -She  tends to self adjust her methimazole, on her last visit she self increased the dose, on today's visit she self discontinued approximately 3 months ago -TSH is slightly low, but T3 and T4 normal, we have opted to monitor and not restart methimazole at this time    Medications  N/a    2. Graves' Disease:   - Pt with burning and itching sensation of the eyes - She follows with optometry   F/U in 4 months   Signed electronically by: Lyndle Herrlich, MD  [facility]  [facility] [address] [place] [place], [place] [address] Phone: [phone] FAX: [phone]      CC: [place], Betty G, MD [address] [place] [address] Phone: [phone]  Fax: [phone]   Return to Endocrinology clinic as below: No future appointments.

## 2023-08-04 ENCOUNTER — Telehealth: Payer: Self-pay | Admitting: Internal Medicine

## 2023-08-04 MED ORDER — METHIMAZOLE 5 MG PO TABS: 5.0000 mg | ORAL_TABLET | ORAL | 2 refills | Status: DC

## 2023-08-04 NOTE — Telephone Encounter (Signed)
Can you please contact the patient and let her know that her TSH remains stable but shows slight overactivity of the thyroid.  Given that she was complaining about her eyes, I would suggest putting her back on methimazole at a small dose such as 1 tablet 3 times a week.  She can certainly not take any medication and we will continue to monitor   Please schedule the patient for follow-up in 6 months (I initially wrote a year thinking that her thyroid would be normal) .  But since is not normal I would like to see her again in 6 months   Thanks

## 2023-08-04 NOTE — Telephone Encounter (Signed)
Patient advised and will start back on the Methimazole as advised. She states that she still has some pills left.

## 2023-09-27 NOTE — Progress Notes (Unsigned)
HPI: Judith Moore is a 37 y.o. female with a PMHx significant for GERD, hyperthyroidism, anxiety, depression, HLD, iron deficiency anemia, low back pain, and multiple vitamin deficiencies, who is here today for her routine physical.  Last CPE: 09/22/2022 She has seen gynecology, dermatology, and endocrinology since her last visit.   Exercise: Patient admits she has not been exercising.  Diet: She is cooking at home and eating vegetables daily. She has been snacking on chips and sweets, but is trying to improve her snacking habits. Sleep: ~6 hours per night.  Alcohol Use: She says she has 1-2 glasses of wine per week. Smoking: never Vision: UTD on routine vision care Dental: She is looking for a new dentist.  LMP: now  Immunization History  Administered Date(s) Administered   Tdap 07/29/2020   Health Maintenance  Topic Date Due   COVID-19 Vaccine (1) Never done   INFLUENZA VACCINE  01/10/2024 (Originally 05/13/2023)   Cervical Cancer Screening (HPV/Pap Cotest)  11/25/2026   DTaP/Tdap/Td (2 - Td or Tdap) 07/29/2030   Hepatitis C Screening  Completed   HIV Screening  Completed   HPV VACCINES  Aged Out   Chronic medical problems:   Hyperlipidemia: Not currently on pharmacologic treatment. Lab Results  Component Value Date   CHOL 207 (H) 09/22/2022   HDL 74.50 09/22/2022   LDLCALC 122 (H) 09/22/2022   TRIG 55.0 09/22/2022   CHOLHDL 3 09/22/2022   Anxiety/depression:  She has been prescribed fluoxetine 10 mg prn, but has not been taking it very frequently.   Acne:  Currently on spironolactone 50 mg daily.   Vitamin D deficiency/iron deficiency anemia:  She has not been taking vitamin D supplements or an iron supplement outside of a multivitamin.  She endorses some recent fatigue and bruising, and wonders if it is related to vitamin deficiencies.   Lab Results  Component Value Date   VD25OH 30.62 09/22/2022   Lab Results  Component Value Date   HGBA1C 5.8  09/22/2022   Concerns today:   Chest pain:  She mentions she is having occasional chest pain and palpitations. She endorses associated dizziness and feeling like her heart is "racing."  She says this happens while she is sitting down working.  She has a history of acid reflux, and believes it could be related to that.  She also wonders if it could be related to her Grave's disease or a vitamin or iron deficiency.  Last EKG was in 11/2020.   Lab Results  Component Value Date   WBC 3.8 (L) 09/22/2022   HGB 12.9 09/22/2022   HCT 38.7 09/22/2022   MCV 82.7 09/22/2022   PLT 322.0 09/22/2022   Review of Systems  Constitutional:  Negative for activity change, appetite change and fever.  HENT:  Negative for hearing loss, mouth sores, sore throat and trouble swallowing.   Eyes:  Negative for redness and visual disturbance.  Respiratory:  Negative for cough, shortness of breath and wheezing.   Cardiovascular:  Positive for chest pain (at rest) and palpitations. Negative for leg swelling.  Gastrointestinal:  Negative for abdominal pain, nausea and vomiting.       No changes in bowel habits.  Endocrine: Negative for cold intolerance, heat intolerance, polydipsia, polyphagia and polyuria.  Genitourinary:  Negative for decreased urine volume, dysuria, hematuria, vaginal bleeding and vaginal discharge.  Musculoskeletal:  Negative for gait problem and myalgias.  Skin:  Negative for color change and rash.  Allergic/Immunologic: Positive for environmental allergies.  Neurological:  Negative for syncope, weakness and headaches.  Hematological:  Negative for adenopathy. Does not bruise/bleed easily.  Psychiatric/Behavioral:  Negative for confusion and hallucinations. The patient is nervous/anxious.   All other systems reviewed and are negative.  Current Outpatient Medications on File Prior to Visit  Medication Sig Dispense Refill   clobetasol ointment (TEMOVATE) 0.05 % Apply to affected area in  scalp 3-4 times per week. Not to face.     Dapsone 7.5 % GEL Apply to face each morning     FLUoxetine (PROZAC) 10 MG capsule Take 1 capsule (10 mg total) by mouth daily. (Patient not taking: Reported on 08/03/2023) 30 capsule 3   methimazole (TAPAZOLE) 5 MG tablet Take 1 tablet (5 mg total) by mouth as directed. 1 tablet 3 days a week 39 tablet 2   metroNIDAZOLE (FLAGYL) 500 MG tablet TAKE 1 TABLET BY MOUTH TWICE A DAY (Patient not taking: Reported on 08/03/2023) 14 tablet 2   Multiple Vitamin (MULTIVITAMIN PO) Take 1 tablet by mouth daily at 6 (six) AM.     spironolactone (ALDACTONE) 50 MG tablet Take 1 tablet by mouth daily.     tretinoin (RETIN-A) 0.025 % cream Apply to face nightly to tolerance     No current facility-administered medications on file prior to visit.    Past Medical History:  Diagnosis Date   Allergies    Anxiety    Gallstones    Hyperthyroidism     Past Surgical History:  Procedure Laterality Date   ADENOIDECTOMY     CESAREAN SECTION  09/28/2005, 11/28/2007   CHOLECYSTECTOMY  04/2021   LAPAROSCOPIC APPENDECTOMY N/A 01/06/2016   Procedure: APPENDECTOMY LAPAROSCOPIC;  Surgeon: Luretha Murphy, MD;  Location: [facility];  Service: General;  Laterality: N/A;   stomach banding     TONSILLECTOMY      Allergies  Allergen Reactions   Almond (Diagnostic) Hives and Swelling    Almonds   Wellbutrin [Bupropion] Hives    Lip    Family History  Problem Relation Age of Onset   Cancer Paternal Grandfather    Diabetes Paternal Grandmother    Hypertension Maternal Grandmother    Hypertension Maternal Grandfather    Kidney failure Maternal Grandfather    Hypertension Mother    Thyroid disease Mother        multinodular goiter   Healthy Brother        x1   Diabetes Maternal Uncle    Heart disease Neg Hx    Colon cancer Neg Hx     Social History   Socioeconomic History   Marital status: Divorced    Spouse name: SEPARATED   Number of children: 2   Years of  education: 15+   Highest education level: Not on file  Occupational History   Occupation: HEALTH CARE BILLING SPECIALIST    Employer: [employer]  Tobacco Use   Smoking status: Never   Smokeless tobacco: Never  Vaping Use   Vaping status: Never Used  Substance and Sexual Activity   Alcohol use: Yes    Comment: occasional   Drug use: Yes    Types: Marijuana   Sexual activity: Yes    Partners: Male    Birth control/protection: None  Other Topics Concern   Not on file  Social History Narrative   Lives with her two sons, her uncle and cousin. Separated from husband 11/2010.   Social Drivers of Corporate investment banker Strain: Not on file  Food Insecurity: Not on file  Transportation  Needs: Not on file  Physical Activity: Not on file  Stress: Not on file  Social Connections: Not on file    Vitals:   09/28/23 0800  BP: 110/80  Resp: 12  Temp: 98.6 F (37 C)   Body mass index is 33.77 kg/m.  Wt Readings from Last 3 Encounters:  09/28/23 209 lb 4 oz (94.9 kg)  08/03/23 203 lb (92.1 kg)  06/21/23 204 lb (92.5 kg)    Physical Exam Vitals and nursing note reviewed.  Constitutional:      General: She is not in acute distress.    Appearance: She is well-developed.  HENT:     Head: Normocephalic and atraumatic.     Right Ear: Tympanic membrane, ear canal and external ear normal.     Left Ear: Tympanic membrane, ear canal and external ear normal.     Mouth/Throat:     Mouth: Mucous membranes are moist.     Pharynx: Oropharynx is clear. Uvula midline.  Eyes:     Extraocular Movements: Extraocular movements intact.     Conjunctiva/sclera: Conjunctivae normal.     Pupils: Pupils are equal, round, and reactive to light.  Neck:     Thyroid: No thyromegaly.     Trachea: No tracheal deviation.  Cardiovascular:     Rate and Rhythm: Normal rate and regular rhythm.     Pulses:          Dorsalis pedis pulses are 2+ on the right side and 2+ on the left side.     Heart  sounds: No murmur heard. Pulmonary:     Effort: Pulmonary effort is normal. No respiratory distress.     Breath sounds: Normal breath sounds.  Abdominal:     Palpations: Abdomen is soft. There is no hepatomegaly or mass.     Tenderness: There is no abdominal tenderness.  Genitourinary:    Comments: Deferred to gyn. Musculoskeletal:     Right lower leg: No edema.     Left lower leg: No edema.     Comments: No major deformity or signs of synovitis appreciated.  Lymphadenopathy:     Cervical: No cervical adenopathy.     Upper Body:     Right upper body: No supraclavicular adenopathy.     Left upper body: No supraclavicular adenopathy.  Skin:    General: Skin is warm.     Findings: No erythema or rash.  Neurological:     General: No focal deficit present.     Mental Status: She is alert and oriented to person, place, and time.     Cranial Nerves: No cranial nerve deficit.     Sensory: No sensory deficit.     Motor: No weakness.     Coordination: Coordination normal.     Gait: Gait normal.     Deep Tendon Reflexes:     Reflex Scores:      Bicep reflexes are 2+ on the right side and 2+ on the left side.      Patellar reflexes are 2+ on the right side and 2+ on the left side. Psychiatric:        Mood and Affect: Mood and affect normal.   ASSESSMENT AND PLAN:  Judith Moore was here today for her annual physical examination.  Orders Placed This Encounter  Procedures   Basic metabolic panel   Hemoglobin A1c   Lipid panel   CBC   Iron   Ferritin   VITAMIN D 25 Hydroxy (Vit-D Deficiency,  Fractures)   EKG 12-Lead   *** Routine general medical examination at a health care facility Assessment & Plan: We discussed the importance of regular physical activity and healthy diet for prevention of chronic illness and/or complications. Preventive guidelines reviewed. Vaccination up to date, declined flu vaccine. Continue her female preventive care with gynecologist. Next  CPE in a year.   Screening for endocrine, metabolic and immunity disorder -     Basic metabolic panel; Future -     Hemoglobin A1c; Future  Hyperlipidemia, unspecified hyperlipidemia type Assessment & Plan: Non pharmacologic treatment recommended. Further recommendation will be given according to lipid panel result.  Orders: -     Lipid panel; Future  Vitamin D deficiency, unspecified Assessment & Plan: She has not been consistent with taking vitamin D supplementation. Further recommendation will be given according to 25 OH vitamin D result.  Orders: -     VITAMIN D 25 Hydroxy (Vit-D Deficiency, Fractures); Future  Iron deficiency anemia, unspecified iron deficiency anemia type Assessment & Plan: On daily multivitamin, she is not taking extra iron supplementation. Further recommendation will be given according to CBC and iron studies.  Orders: -     CBC; Future -     Iron; Future -     Ferritin; Future  Other chest pain -     EKG 12-Lead  We discussed possible etiologies, GERD and anxiety could be playing a role. History and examination do not suggest a serious process. EKG today: Sinus rhythm, normal axis and intervals.  No significant changes when compared with prior EKGs. I do not think further workup is needed at this time. Instructed about warning signs.  Return in 1 year (on 09/27/2024).  I, Judith Moore, acting as a scribe for Judith Mulkern [place], MD., have documented all relevant documentation on the behalf of Judith Servidio [place], MD, as directed by  Judith Hefel [place], MD while in the presence of Judith Mooty [place], MD.   I, Judith Lanter [place], MD, have reviewed all documentation for this visit. The documentation on 09/28/23 for the exam, diagnosis, procedures, and orders are all accurate and complete.  Judith Alomar G. [place], MD  [facility]. [place] office.

## 2023-09-28 ENCOUNTER — Ambulatory Visit: Payer: Commercial Managed Care - PPO | Admitting: Family Medicine

## 2023-09-28 ENCOUNTER — Encounter: Payer: Self-pay | Admitting: Family Medicine

## 2023-09-28 VITALS — BP 110/80 | HR 64 | Temp 98.6°F | Resp 12 | Ht 66.0 in | Wt 209.2 lb

## 2023-09-28 DIAGNOSIS — D509 Iron deficiency anemia, unspecified: Secondary | ICD-10-CM

## 2023-09-28 DIAGNOSIS — Z0000 Encounter for general adult medical examination without abnormal findings: Secondary | ICD-10-CM

## 2023-09-28 DIAGNOSIS — Z0001 Encounter for general adult medical examination with abnormal findings: Secondary | ICD-10-CM | POA: Diagnosis not present

## 2023-09-28 DIAGNOSIS — Z131 Encounter for screening for diabetes mellitus: Secondary | ICD-10-CM | POA: Diagnosis not present

## 2023-09-28 DIAGNOSIS — R0789 Other chest pain: Secondary | ICD-10-CM

## 2023-09-28 DIAGNOSIS — E785 Hyperlipidemia, unspecified: Secondary | ICD-10-CM

## 2023-09-28 DIAGNOSIS — Z6833 Body mass index (BMI) 33.0-33.9, adult: Secondary | ICD-10-CM | POA: Diagnosis not present

## 2023-09-28 DIAGNOSIS — E559 Vitamin D deficiency, unspecified: Secondary | ICD-10-CM | POA: Diagnosis not present

## 2023-09-28 DIAGNOSIS — Z13228 Encounter for screening for other metabolic disorders: Secondary | ICD-10-CM

## 2023-09-28 LAB — BASIC METABOLIC PANEL
BUN: 9 mg/dL (ref 6–23)
CO2: 27 meq/L (ref 19–32)
Calcium: 8.9 mg/dL (ref 8.4–10.5)
Chloride: 104 meq/L (ref 96–112)
Creatinine, Ser: 0.76 mg/dL (ref 0.40–1.20)
GFR: 99.54 mL/min (ref >60.00)
Glucose, Bld: 86 mg/dL (ref 70–99)
Potassium: 3.9 meq/L (ref 3.5–5.1)
Sodium: 138 meq/L (ref 135–145)

## 2023-09-28 LAB — CBC
HCT: 37.7 % (ref 36.0–46.0)
Hemoglobin: 12.3 g/dL (ref 12.0–15.0)
MCHC: 32.6 g/dL (ref 30.0–36.0)
MCV: 84.4 fL (ref 78.0–100.0)
Platelets: 305 10*3/uL (ref 150.0–400.0)
RBC: 4.47 Mil/uL (ref 3.87–5.11)
RDW: 13.6 % (ref 11.5–15.5)
WBC: 4.4 10*3/uL (ref 4.0–10.5)

## 2023-09-28 LAB — LIPID PANEL
Cholesterol: 173 mg/dL (ref 0–200)
HDL: 65.1 mg/dL (ref >39.00)
LDL Cholesterol: 95 mg/dL (ref 0–99)
NonHDL: 107.88
Total CHOL/HDL Ratio: 3
Triglycerides: 66 mg/dL (ref 0.0–149.0)
VLDL: 13.2 mg/dL (ref 0.0–40.0)

## 2023-09-28 LAB — VITAMIN D 25 HYDROXY (VIT D DEFICIENCY, FRACTURES): VITD: 27.37 ng/mL — ABNORMAL LOW (ref 30.00–100.00)

## 2023-09-28 LAB — FERRITIN: Ferritin: 28.2 ng/mL (ref 10.0–291.0)

## 2023-09-28 LAB — IRON: Iron: 68 ug/dL (ref 42–145)

## 2023-09-28 LAB — HEMOGLOBIN A1C: Hgb A1c MFr Bld: 5.8 % (ref 4.6–6.5)

## 2023-09-28 NOTE — Patient Instructions (Addendum)
A few things to remember from today's visit:  Routine general medical examination at a health care facility  Screening for endocrine, metabolic and immunity disorder - Plan: Basic metabolic panel, Hemoglobin A1c  Hyperlipidemia, unspecified hyperlipidemia type - Plan: Lipid panel  Vitamin D deficiency, unspecified - Plan: VITAMIN D 25 Hydroxy (Vit-D Deficiency, Fractures)  Iron deficiency anemia, unspecified iron deficiency anemia type - Plan: CBC, Iron, Ferritin  Try to take Fluoxetine daily as recommended by your gynecologist. Chest pain is not suggestive of heart disease, monitor for changes or new symptoms.  Do not use My Chart to request refills or for acute issues that need immediate attention. If you send a my chart message, it may take a few days to be addressed, specially if I am not in the office.  Please be sure medication list is accurate. If a new problem present, please set up appointment sooner than planned today.  Health Maintenance, Female Adopting a healthy lifestyle and getting preventive care are important in promoting health and wellness. Ask your health care provider about: The right schedule for you to have regular tests and exams. Things you can do on your own to prevent diseases and keep yourself healthy. What should I know about diet, weight, and exercise? Eat a healthy diet  Eat a diet that includes plenty of vegetables, fruits, low-fat dairy products, and lean protein. Do not eat a lot of foods that are high in solid fats, added sugars, or sodium. Maintain a healthy weight Body mass index (BMI) is used to identify weight problems. It estimates body fat based on height and weight. Your health care provider can help determine your BMI and help you achieve or maintain a healthy weight. Get regular exercise Get regular exercise. This is one of the most important things you can do for your health. Most adults should: Exercise for at least 150 minutes each  week. The exercise should increase your heart rate and make you sweat (moderate-intensity exercise). Do strengthening exercises at least twice a week. This is in addition to the moderate-intensity exercise. Spend less time sitting. Even light physical activity can be beneficial. Watch cholesterol and blood lipids Have your blood tested for lipids and cholesterol at 38 years of age, then have this test every 5 years. Have your cholesterol levels checked more often if: Your lipid or cholesterol levels are high. You are older than 39 years of age. You are at high risk for heart disease. What should I know about cancer screening? Depending on your health history and family history, you may need to have cancer screening at various ages. This may include screening for: Breast cancer. Cervical cancer. Colorectal cancer. Skin cancer. Lung cancer. What should I know about heart disease, diabetes, and high blood pressure? Blood pressure and heart disease High blood pressure causes heart disease and increases the risk of stroke. This is more likely to develop in people who have high blood pressure readings or are overweight. Have your blood pressure checked: Every 3-5 years if you are 50-63 years of age. Every year if you are 56 years old or older. Diabetes Have regular diabetes screenings. This checks your fasting blood sugar level. Have the screening done: Once every three years after age 38 if you are at a normal weight and have a low risk for diabetes. More often and at a younger age if you are overweight or have a high risk for diabetes. What should I know about preventing infection? Hepatitis B If you have  a higher risk for hepatitis B, you should be screened for this virus. Talk with your health care provider to find out if you are at risk for hepatitis B infection. Hepatitis C Testing is recommended for: Everyone born from [birth year] through 1966. Anyone with known risk factors for hepatitis  C. Sexually transmitted infections (STIs) Get screened for STIs, including gonorrhea and chlamydia, if: You are sexually active and are younger than 38 years of age. You are older than 38 years of age and your health care provider tells you that you are at risk for this type of infection. Your sexual activity has changed since you were last screened, and you are at increased risk for chlamydia or gonorrhea. Ask your health care provider if you are at risk. Ask your health care provider about whether you are at high risk for HIV. Your health care provider may recommend a prescription medicine to help prevent HIV infection. If you choose to take medicine to prevent HIV, you should first get tested for HIV. You should then be tested every 3 months for as long as you are taking the medicine. Pregnancy If you are about to stop having your period (premenopausal) and you may become pregnant, seek counseling before you get pregnant. Take 400 to 800 micrograms (mcg) of folic acid every day if you become pregnant. Ask for birth control (contraception) if you want to prevent pregnancy. Osteoporosis and menopause Osteoporosis is a disease in which the bones lose minerals and strength with aging. This can result in bone fractures. If you are 52 years old or older, or if you are at risk for osteoporosis and fractures, ask your health care provider if you should: Be screened for bone loss. Take a calcium or vitamin D supplement to lower your risk of fractures. Be given hormone replacement therapy (HRT) to treat symptoms of menopause. Follow these instructions at home: Alcohol use Do not drink alcohol if: Your health care provider tells you not to drink. You are pregnant, may be pregnant, or are planning to become pregnant. If you drink alcohol: Limit how much you have to: 0-1 drink a day. Know how much alcohol is in your drink. In the [place] one drink equals one 12 oz bottle of beer (355 mL), one 5 oz glass  of wine (148 mL), or one 1 oz glass of hard liquor (44 mL). Lifestyle Do not use any products that contain nicotine or tobacco. These products include cigarettes, chewing tobacco, and vaping devices, such as e-cigarettes. If you need help quitting, ask your health care provider. Do not use street drugs. Do not share needles. Ask your health care provider for help if you need support or information about quitting drugs. General instructions Schedule regular health, dental, and eye exams. Stay current with your vaccines. Tell your health care provider if: You often feel depressed. You have ever been abused or do not feel safe at home. Summary Adopting a healthy lifestyle and getting preventive care are important in promoting health and wellness. Follow your health care provider's instructions about healthy diet, exercising, and getting tested or screened for diseases. Follow your health care provider's instructions on monitoring your cholesterol and blood pressure. This information is not intended to replace advice given to you by your health care provider. Make sure you discuss any questions you have with your health care provider. Document Revised: 02/17/2021 Document Reviewed: 02/17/2021 Elsevier Patient Education  2024 [employer].

## 2023-09-28 NOTE — Assessment & Plan Note (Signed)
On daily multivitamin, she is not taking extra iron supplementation. Further recommendation will be given according to CBC and iron studies.

## 2023-09-28 NOTE — Assessment & Plan Note (Signed)
We discussed the importance of regular physical activity and healthy diet for prevention of chronic illness and/or complications. Preventive guidelines reviewed. Vaccination up to date, declined flu vaccine. Continue her female preventive care with gynecologist. Next CPE in a year.

## 2023-09-28 NOTE — Assessment & Plan Note (Signed)
She has not been consistent with taking vitamin D supplementation. Further recommendation will be given according to 25 OH vitamin D result. 

## 2023-09-28 NOTE — Assessment & Plan Note (Signed)
Non pharmacologic treatment recommended. Further recommendation will be given according to lipid panel result.

## 2023-10-28 ENCOUNTER — Ambulatory Visit (INDEPENDENT_AMBULATORY_CARE_PROVIDER_SITE_OTHER): Payer: Commercial Managed Care - PPO

## 2023-10-28 ENCOUNTER — Ambulatory Visit: Payer: Self-pay | Admitting: Family Medicine

## 2023-10-28 ENCOUNTER — Ambulatory Visit: Payer: Commercial Managed Care - PPO | Admitting: Family Medicine

## 2023-10-28 ENCOUNTER — Encounter: Payer: Self-pay | Admitting: Family Medicine

## 2023-10-28 VITALS — BP 124/78 | HR 80 | Temp 97.6°F | Ht 66.0 in | Wt 212.0 lb

## 2023-10-28 DIAGNOSIS — Z8719 Personal history of other diseases of the digestive system: Secondary | ICD-10-CM

## 2023-10-28 DIAGNOSIS — R0789 Other chest pain: Secondary | ICD-10-CM

## 2023-10-28 DIAGNOSIS — Z903 Acquired absence of stomach [part of]: Secondary | ICD-10-CM | POA: Diagnosis not present

## 2023-10-28 NOTE — Progress Notes (Signed)
Subjective:     Patient ID: Judith Moore, female    DOB: [birth year], 38 y.o.   MRN: [id number]  Chief Complaint  Patient presents with   Chest Pain    Over a month    HPI   History of Present Illness         She is a patient of Dr. [place]. Here today with c/o chest pain x 4 wks. States pain has been daily, intermittent, left sided and radiates down under her left armpit. She has 2 episodes everyday. Feels tired when the pain is present. Denies pain with walking on the treadmill or other types of exertion.   Reports having chest pain for "years". States she is taking metoprolol which has been helpful with palpitations.   Reports hx of GERD. She has not been having acid reflux. Hx of gastric sleeve.   States she presses on her chest wall when the pain is present and non tender.   Denies strenuous upper body movements. No coughing.   Denies pain today.   Denies family hx of cardiac disease or early death.     There are no preventive care reminders to display for this patient.  Past Medical History:  Diagnosis Date   Allergies    Anxiety    Gallstones    Hyperthyroidism     Past Surgical History:  Procedure Laterality Date   ADENOIDECTOMY     CESAREAN SECTION  09/28/2005, 11/28/2007   CHOLECYSTECTOMY  04/2021   LAPAROSCOPIC APPENDECTOMY N/A 01/06/2016   Procedure: APPENDECTOMY LAPAROSCOPIC;  Surgeon: Luretha Murphy, MD;  Location: [facility];  Service: General;  Laterality: N/A;   stomach banding     TONSILLECTOMY      Family History  Problem Relation Age of Onset   Cancer Paternal Grandfather    Diabetes Paternal Grandmother    Hypertension Maternal Grandmother    Hypertension Maternal Grandfather    Kidney failure Maternal Grandfather    Hypertension Mother    Thyroid disease Mother        multinodular goiter   Healthy Brother        x1   Diabetes Maternal Uncle    Heart disease Neg Hx    Colon cancer Neg Hx     Social History   Socioeconomic  History   Marital status: Divorced    Spouse name: SEPARATED   Number of children: 2   Years of education: 15+   Highest education level: Not on file  Occupational History   Occupation: HEALTH CARE BILLING SPECIALIST    Employer: [employer]  Tobacco Use   Smoking status: Never   Smokeless tobacco: Never  Vaping Use   Vaping status: Never Used  Substance and Sexual Activity   Alcohol use: Yes    Comment: occasional   Drug use: Yes    Types: Marijuana   Sexual activity: Yes    Partners: Male    Birth control/protection: None  Other Topics Concern   Not on file  Social History Narrative   Lives with her two sons, her uncle and cousin. Separated from husband 11/2010.   Social Drivers of Corporate investment banker Strain: Not on file  Food Insecurity: Not on file  Transportation Needs: Not on file  Physical Activity: Not on file  Stress: Not on file  Social Connections: Not on file  Intimate Partner Violence: Not on file    Outpatient Medications Prior to Visit  Medication Sig Dispense Refill   clobetasol  ointment (TEMOVATE) 0.05 % Apply to affected area in scalp 3-4 times per week. Not to face.     Dapsone 7.5 % GEL Apply to face each morning     FLUoxetine (PROZAC) 10 MG capsule Take 1 capsule (10 mg total) by mouth daily. 30 capsule 3   methimazole (TAPAZOLE) 5 MG tablet Take 1 tablet (5 mg total) by mouth as directed. 1 tablet 3 days a week 39 tablet 2   metroNIDAZOLE (FLAGYL) 500 MG tablet TAKE 1 TABLET BY MOUTH TWICE A DAY 14 tablet 2   minoxidil (LONITEN) 2.5 MG tablet Take by mouth.     Multiple Vitamin (MULTIVITAMIN PO) Take 1 tablet by mouth daily at 6 (six) AM.     spironolactone (ALDACTONE) 50 MG tablet Take 1 tablet by mouth daily.     tretinoin (RETIN-A) 0.025 % cream Apply to face nightly to tolerance     No facility-administered medications prior to visit.    Allergies  Allergen Reactions   Almond (Diagnostic) Hives and Swelling    Almonds    Wellbutrin [Bupropion] Hives    Lip    Review of Systems  Constitutional:  Positive for malaise/fatigue. Negative for chills and fever.  Respiratory:  Negative for cough and shortness of breath.   Cardiovascular:  Positive for chest pain. Negative for palpitations, orthopnea, claudication and leg swelling.  Gastrointestinal:  Negative for abdominal pain, constipation, diarrhea, nausea and vomiting.  Neurological:  Negative for dizziness, focal weakness and headaches.  Psychiatric/Behavioral:  Negative for depression. The patient is nervous/anxious.        Objective:    Physical Exam Constitutional:      General: She is not in acute distress.    Appearance: She is not ill-appearing.  Eyes:     Extraocular Movements: Extraocular movements intact.     Conjunctiva/sclera: Conjunctivae normal.  Cardiovascular:     Rate and Rhythm: Normal rate and regular rhythm.  Pulmonary:     Effort: Pulmonary effort is normal.     Breath sounds: Normal breath sounds.  Chest:     Chest wall: No tenderness.  Musculoskeletal:     Cervical back: Normal range of motion and neck supple.     Right lower leg: No edema.     Left lower leg: No edema.  Skin:    General: Skin is warm and dry.  Neurological:     General: No focal deficit present.     Mental Status: She is alert and oriented to person, place, and time.     Cranial Nerves: No cranial nerve deficit.     Motor: No weakness.  Psychiatric:        Mood and Affect: Mood normal. Mood is not anxious.        Behavior: Behavior normal.        Thought Content: Thought content normal.      BP 124/78 (BP Location: Left Arm, Patient Position: Sitting, Cuff Size: Normal)   Pulse 80   Temp 97.6 F (36.4 C) (Temporal)   Ht 5\' 6"  (1.676 m)   Wt 212 lb (96.2 kg)   LMP 09/27/2023   BMI 34.22 kg/m  Wt Readings from Last 3 Encounters:  10/28/23 212 lb (96.2 kg)  09/28/23 209 lb 4 oz (94.9 kg)  08/03/23 203 lb (92.1 kg)       Assessment &  Plan:   Problem List Items Addressed This Visit   None Visit Diagnoses       Other chest  pain    -  Primary   Relevant Orders   DG Chest 2 View (Completed)     History of sleeve gastrectomy         History of gastroesophageal reflux (GERD)          Currently asymptomatic.  Reviewed recent PCP notes as well as labs and EKG from last month when she was also having the symptoms.  Discussed that all results are reassuring. Discussed possible etiologies including GERD or musculoskeletal. Chest x-ray ordered and negative. Recommend trying Pepcid daily x 2 weeks.  We also discussed trying Tylenol 500 mg at onset of pain.  She will look for triggers or associated symptoms.  Recommend follow-up with PCP if not improving or if she has any new or worsening symptoms.  I am having Judith Moore maintain her Multiple Vitamin (MULTIVITAMIN PO), FLUoxetine, metroNIDAZOLE, Dapsone, clobetasol ointment, tretinoin, spironolactone, methimazole, and minoxidil.  No orders of the defined types were placed in this encounter.

## 2023-10-28 NOTE — Telephone Encounter (Signed)
Copied from CRM [id number]. Topic: Clinical - Red Word Triage >> Oct 28, 2023  8:32 AM Donita Brooks wrote: Red Word that prompted transfer to Nurse Triage: pt receive a message through Mychart for triage nurse - chest pain been going on for a month. Couldn't hear pt  Chief Complaint: chest pain Symptoms: chest pain that lasts hours Frequency: comes and goes during the day Pertinent Negatives: Patient denies sob, fever, numbness weakness Disposition: [] ED /[] Urgent Care (no appt availability in office) / [x] Appointment(In office/virtual)/ []  [place] [facility]/ [] Home Care/ [] Refused Recommended Disposition /[]  Mobile Bus/ []  Follow-up with PCP Additional Notes: patient c/o chest pain that lasts for hours and comes and goes during the day.  States it has been happening for over a month now.  Apt made for this am.  Instructed to go to the ER if becomes worse.   Reason for Disposition  [1] Chest pain from known angina comes and goes AND [2] is NOT happening more often (increasing in frequency) or getting worse (increasing in severity)  Answer Assessment - Initial Assessment Questions 1. LOCATION: "Where does it hurt?"       chest 2. RADIATION: "Does the pain go anywhere else?" (e.g., into neck, jaw, arms, back) Shooting down to back      3. ONSET: "When did the chest pain begin?" (Minutes, hours or days)      About a month and a half. 4. PATTERN: "Does the pain come and go, or has it been constant since it started?"  "Does it get worse with exertion?"      Constant but comes and goes during the day 5. DURATION: "How long does it last" (e.g., seconds, minutes, hours)     Hours at a time 6. SEVERITY: "How bad is the pain?"  (e.g., Scale 1-10; mild, moderate, or severe)    - MILD (1-3): doesn't interfere with normal activities     - MODERATE (4-7): interferes with normal activities or awakens from sleep    - SEVERE (8-10): excruciating pain, unable to do any normal activities        5/10 7. CARDIAC RISK FACTORS: "Do you have any history of heart problems or risk factors for heart disease?" (e.g., angina, prior heart attack; diabetes, high blood pressure, high cholesterol, smoker, or strong family history of heart disease)     smoker 8. PULMONARY RISK FACTORS: "Do you have any history of lung disease?"  (e.g., blood clots in lung, asthma, emphysema, birth control pills)     denies 9. CAUSE: "What do you think is causing the chest pain?"     unknown 10. OTHER SYMPTOMS: "Do you have any other symptoms?" (e.g., dizziness, nausea, vomiting, sweating, fever, difficulty breathing, cough)       dizzy  Protocols used: Chest Pain-A-AH

## 2023-10-28 NOTE — Patient Instructions (Signed)
Please go downstairs for a chest X ray.   Try taking Pepcid over the counter everyday for the next 2 weeks.   Try Tylenol 500 mg the next time you have the pain.   Please follow up with Dr. [place] if the pain is not improving or if you are having any new or worsening symptoms.

## 2023-10-29 NOTE — Telephone Encounter (Signed)
Noted  

## 2023-11-01 ENCOUNTER — Ambulatory Visit: Payer: Commercial Managed Care - PPO | Admitting: Family Medicine

## 2024-05-05 ENCOUNTER — Encounter: Payer: Self-pay | Admitting: Family Medicine

## 2024-05-05 ENCOUNTER — Ambulatory Visit (INDEPENDENT_AMBULATORY_CARE_PROVIDER_SITE_OTHER): Admitting: Family Medicine

## 2024-05-05 VITALS — BP 128/80 | HR 72 | Temp 98.1°F | Resp 12 | Ht 66.0 in | Wt 213.0 lb

## 2024-05-05 DIAGNOSIS — F331 Major depressive disorder, recurrent, moderate: Secondary | ICD-10-CM | POA: Diagnosis not present

## 2024-05-05 DIAGNOSIS — E66811 Obesity, class 1: Secondary | ICD-10-CM | POA: Diagnosis not present

## 2024-05-05 DIAGNOSIS — F419 Anxiety disorder, unspecified: Secondary | ICD-10-CM

## 2024-05-05 DIAGNOSIS — Z6831 Body mass index (BMI) 31.0-31.9, adult: Secondary | ICD-10-CM | POA: Diagnosis not present

## 2024-05-05 DIAGNOSIS — F3281 Premenstrual dysphoric disorder: Secondary | ICD-10-CM

## 2024-05-05 MED ORDER — FLUOXETINE HCL 20 MG PO CAPS: 20.0000 mg | ORAL_CAPSULE | Freq: Every day | ORAL | 0 refills | Status: DC

## 2024-05-05 MED ORDER — FLUOXETINE HCL 10 MG PO CAPS: 10.0000 mg | ORAL_CAPSULE | Freq: Every day | ORAL | 0 refills | Status: DC

## 2024-05-05 NOTE — Assessment & Plan Note (Signed)
 She has not tolerated other medications in the past. Today fluoxetine  10 mg started, she can increase to 30 mg in 3 weeks. Currently on CBT. Follow-up in 3 to 4 months, before if needed.

## 2024-05-05 NOTE — Progress Notes (Signed)
 ACUTE VISIT Chief Complaint  Patient presents with   mood changes    Anxiety around cycle time; was on Fluoxetine  from her OBGYN, but ran out. Would like to discuss restarting it    Weight Gain   HPI: Judith Moore is a 38 y.o. female, who is here today complaining of weight gain and mood changes.   Anxiety/depression: Pt reports worsening problems around her menses. No current situational stress she attributes this increased stress to.  Was previously taking Fluoxetine  prescribed by her OBGYN, but has ran out of her supply.   She notes she was not completely compliant when taking Fluoxetine .  Also tried Wellbutrin  and Sertraline  in the past, with intolerances including nausea.  She has been f/u with a therapist once monthly for about 1 year. She is not currently on birth control; has been off for about 1 year. Has noticed hormonal changes in the last 6 months since stopping birth control.  Previously had Mirena  for about 15 years.  Trying to establish with new gynecologist.     05/05/2024    2:01 PM 09/28/2023    8:00 AM 06/21/2023    8:24 AM 04/02/2023    7:32 AM 09/22/2022    8:34 AM  Depression screen PHQ 2/9  Decreased Interest 1 0 3 2 1   Down, Depressed, Hopeless 1 0 3 2 1   PHQ - 2 Score 2 0 6 4 2   Altered sleeping 1 0 1 1 1   Tired, decreased energy 2 0 3 2 1   Change in appetite 2 0 1 2 1   Feeling bad or failure about yourself   0 1 1 0  Trouble concentrating 1 0 0 1 0  Moving slowly or fidgety/restless 0 0 0 0 0  Suicidal thoughts 0 0 0 0 0  PHQ-9 Score 8 0 12 11 5   Difficult doing work/chores Somewhat difficult Not difficult at all  Somewhat difficult Not difficult at all      05/05/2024    2:01 PM 09/28/2023    8:01 AM 06/21/2023    8:25 AM 04/02/2023    7:32 AM  GAD 7 : Generalized Anxiety Score  Nervous, Anxious, on Edge 2 0 3 2  Control/stop worrying 1 0 3 1  Worry too much - different things 1 0 3 1  Trouble relaxing 1 0 1 1  Restless 0 0 1 0   Easily annoyed or irritable 2 0 3 0  Afraid - awful might happen 0 0 0 0  Total GAD 7 Score 7 0 14 5  Anxiety Difficulty Somewhat difficult Not difficult at all  Somewhat difficult   Obesity She reports struggling with recent weight gain and endorses increased appetite.  Pt has hx of weight loss+other surgeries, she would like to avoid having additional surgeries/procedures.  Peak weight was 280 lbs and her goal weight is 180s lbs. She would like to try medication.  Hyperthyroidism: Currently on Methimazole . She follows with endocrinologist. Lab Results  Component Value Date   TSH 0.31 (L) 08/03/2023   Review of Systems  Constitutional:  Negative for activity change, chills and fever.  HENT:  Negative for mouth sores and sore throat.   Respiratory:  Negative for cough and shortness of breath.   Cardiovascular:  Negative for chest pain and leg swelling.  Gastrointestinal:  Negative for abdominal pain and nausea.  Endocrine: Negative for cold intolerance and heat intolerance.  Skin:  Negative for rash.  Neurological:  Negative for syncope and  weakness.  Psychiatric/Behavioral:  Negative for confusion and hallucinations.   See other pertinent positives and negatives in HPI.  Current Outpatient Medications on File Prior to Visit  Medication Sig Dispense Refill   clobetasol ointment (TEMOVATE) 0.05 % Apply to affected area in scalp 3-4 times per week. Not to face.     Dapsone 7.5 % GEL Apply to face each morning     methimazole  (TAPAZOLE ) 5 MG tablet Take 1 tablet (5 mg total) by mouth as directed. 1 tablet 3 days a week 39 tablet 2   metroNIDAZOLE  (FLAGYL ) 500 MG tablet TAKE 1 TABLET BY MOUTH TWICE A DAY 14 tablet 2   minoxidil (LONITEN) 2.5 MG tablet Take by mouth.     Multiple Vitamin (MULTIVITAMIN PO) Take 1 tablet by mouth daily at 6 (six) AM.     spironolactone (ALDACTONE) 50 MG tablet Take 1 tablet by mouth daily.     tretinoin (RETIN-A) 0.025 % cream Apply to face nightly  to tolerance     No current facility-administered medications on file prior to visit.   Past Medical History:  Diagnosis Date   Allergies    Anxiety    Gallstones    Hyperthyroidism    Allergies  Allergen Reactions   Almond (Diagnostic) Hives and Swelling    Almonds   Wellbutrin  [Bupropion ] Hives    Lip   Social History   Socioeconomic History   Marital status: Divorced    Spouse name: SEPARATED   Number of children: 2   Years of education: 15+   Highest education level: Not on file  Occupational History   Occupation: HEALTH CARE BILLING SPECIALIST    Employer: [employer]  Tobacco Use   Smoking status: Never   Smokeless tobacco: Never  Vaping Use   Vaping status: Never Used  Substance and Sexual Activity   Alcohol use: Yes    Comment: occasional   Drug use: Yes    Types: Marijuana   Sexual activity: Yes    Partners: Male    Birth control/protection: None  Other Topics Concern   Not on file  Social History Narrative   Lives with her two sons, her uncle and cousin. Separated from husband 11/2010.   Social Drivers of Corporate investment banker Strain: Not on file  Food Insecurity: Not on file  Transportation Needs: Not on file  Physical Activity: Not on file  Stress: Not on file  Social Connections: Not on file   Vitals:   05/05/24 1326  BP: 128/80  Pulse: 72  Resp: 12  Temp: 98.1 F (36.7 C)   Wt Readings from Last 3 Encounters:  05/05/24 213 lb (96.6 kg)  10/28/23 212 lb (96.2 kg)  09/28/23 209 lb 4 oz (94.9 kg)   Body mass index is 34.38 kg/m.  Physical Exam Vitals and nursing note reviewed.  Constitutional:      General: She is not in acute distress.    Appearance: She is well-developed.  HENT:     Head: Normocephalic and atraumatic.  Eyes:     Conjunctiva/sclera: Conjunctivae normal.  Cardiovascular:     Rate and Rhythm: Normal rate and regular rhythm.     Heart sounds: No murmur heard. Pulmonary:     Effort: Pulmonary effort is  normal. No respiratory distress.     Breath sounds: Normal breath sounds.  Lymphadenopathy:     Cervical: No cervical adenopathy.  Skin:    General: Skin is warm.     Findings: No  erythema or rash.  Neurological:     General: No focal deficit present.     Mental Status: She is alert and oriented to person, place, and time.     Gait: Gait normal.  Psychiatric:        Mood and Affect: Mood is not anxious or depressed.        Thought Content: Thought content does not include homicidal or suicidal ideation. Thought content does not include homicidal or suicidal plan.    ASSESSMENT AND PLAN: Ms. KAIDYNCE PFISTER was seen today for weight gain and mood changes.  Class 1 obesity with serious comorbidity and body mass index (BMI) of 31.0 to 31.9 in adult, unspecified obesity type Assessment & Plan: She has gained a few Lb since her visit in 09/2023. Consistency with healthy diet and physical activity encouraged. She would like to try pharmacologic treatment but not interested in establishing with a healthy weight and wellness clinic for now. She is planning on discussing this problem with her endocrinologist.   Depression, major, recurrent, moderate (HCC) Assessment & Plan: She has not tolerated other medications in the past. Today fluoxetine  10 mg started, she can increase to 30 mg in 3 weeks. Currently on CBT. Follow-up in 3 to 4 months, before if needed.  Orders: -     FLUoxetine  HCl; Take 1 capsule (10 mg total) by mouth daily.  Dispense: 30 capsule; Refill: 0 -     FLUoxetine  HCl; Take 1 capsule (20 mg total) by mouth daily.  Dispense: 90 capsule; Refill: 0  Anxiety disorder, unspecified type Assessment & Plan: She has tried different medication in the past, fluoxetine  seems to help, she would like to resume medication. She will start with fluoxetine  10 mg daily and can increase dose to 20 mg daily in 3 weeks. Continue CBT 1 to twice per month. Follow-up in 3 to 4 months,  before if needed.  Orders: -     FLUoxetine  HCl; Take 1 capsule (10 mg total) by mouth daily.  Dispense: 30 capsule; Refill: 0 -     FLUoxetine  HCl; Take 1 capsule (20 mg total) by mouth daily.  Dispense: 90 capsule; Refill: 0  Premenstrual dysphoric disorder Assessment & Plan: She has tried different OCP's and mirena , the latter one seemed to help but her gyn has not been successful with placement due to uterus position. She has tried OCP's in the past, some she not well tolerated, she does not remember the one she did well with. We decided to hold on hormonal therapy for now. Her gynecologist planning on retiring, she is trying to establish with a new provider.   Return in about 17 weeks (around 09/01/2024) for chronic problems.  I, Vernell Forest, acting as a scribe for Cherrie Franca [place], MD., have documented all relevant documentation on the behalf of Yitzchok Carriger [place], MD, as directed by   while in the presence of Jamar Casagrande [place], MD.  I, Reyanne Hussar [place], MD, have reviewed all documentation for this visit. The documentation on 05/05/24 for the exam, diagnosis, procedures, and orders are all accurate and complete.  Bannon Giammarco G. [place], MD  [facility]. [place] office.

## 2024-05-05 NOTE — Assessment & Plan Note (Signed)
 She has tried different medication in the past, fluoxetine  seems to help, she would like to resume medication. She will start with fluoxetine  10 mg daily and can increase dose to 20 mg daily in 3 weeks. Continue CBT 1 to twice per month. Follow-up in 3 to 4 months, before if needed.

## 2024-05-05 NOTE — Assessment & Plan Note (Addendum)
 She has tried different OCP's and mirena , the latter one seemed to help but her gyn has not been successful with placement due to uterus position. She has tried OCP's in the past, some she not well tolerated, she does not remember the one she did well with. We decided to hold on hormonal therapy for now. Her gynecologist planning on retiring, she is trying to establish with a new provider.

## 2024-05-05 NOTE — Assessment & Plan Note (Addendum)
 She has gained a few Lb since her visit in 09/2023. Consistency with healthy diet and physical activity encouraged. She would like to try pharmacologic treatment but not interested in establishing with a healthy weight and wellness clinic for now. She is planning on discussing this problem with her endocrinologist.

## 2024-05-05 NOTE — Patient Instructions (Addendum)
 A few things to remember from today's visit:  Class 1 obesity with serious comorbidity and body mass index (BMI) of 31.0 to 31.9 in adult, unspecified obesity type  Depression, major, recurrent, moderate (HCC) - Plan: FLUoxetine  (PROZAC ) 10 MG capsule, FLUoxetine  (PROZAC ) 20 MG capsule  Anxiety disorder, unspecified type - Plan: FLUoxetine  (PROZAC ) 10 MG capsule, FLUoxetine  (PROZAC ) 20 MG capsule  Start Fluoxetine  10 mg daily and increase to 20 mg in 3 weeks.  If you need refills for medications you take chronically, please call your pharmacy. Do not use My Chart to request refills or for acute issues that need immediate attention. If you send a my chart message, it may take a few days to be addressed, specially if I am not in the office.  Please be sure medication list is accurate. If a new problem present, please set up appointment sooner than planned today.

## 2024-06-21 ENCOUNTER — Ambulatory Visit: Admitting: Obstetrics and Gynecology

## 2024-07-28 ENCOUNTER — Other Ambulatory Visit: Payer: Self-pay | Admitting: Family Medicine

## 2024-07-28 DIAGNOSIS — F331 Major depressive disorder, recurrent, moderate: Secondary | ICD-10-CM

## 2024-07-28 DIAGNOSIS — F419 Anxiety disorder, unspecified: Secondary | ICD-10-CM

## 2024-07-31 ENCOUNTER — Ambulatory Visit: Admitting: Obstetrics and Gynecology

## 2024-08-02 ENCOUNTER — Other Ambulatory Visit

## 2024-08-02 ENCOUNTER — Ambulatory Visit: Admitting: Obstetrics and Gynecology

## 2024-08-02 ENCOUNTER — Encounter: Payer: Self-pay | Admitting: Obstetrics and Gynecology

## 2024-08-02 ENCOUNTER — Encounter: Payer: Self-pay | Admitting: Internal Medicine

## 2024-08-02 ENCOUNTER — Ambulatory Visit: Payer: Commercial Managed Care - PPO | Admitting: Internal Medicine

## 2024-08-02 VITALS — BP 120/84 | HR 84 | Ht 66.0 in | Wt 211.0 lb

## 2024-08-02 VITALS — BP 129/92 | HR 73 | Wt 211.4 lb

## 2024-08-02 DIAGNOSIS — E0500 Thyrotoxicosis with diffuse goiter without thyrotoxic crisis or storm: Secondary | ICD-10-CM

## 2024-08-02 DIAGNOSIS — E0590 Thyrotoxicosis, unspecified without thyrotoxic crisis or storm: Secondary | ICD-10-CM | POA: Diagnosis not present

## 2024-08-02 DIAGNOSIS — N920 Excessive and frequent menstruation with regular cycle: Secondary | ICD-10-CM

## 2024-08-02 DIAGNOSIS — Z01419 Encounter for gynecological examination (general) (routine) without abnormal findings: Secondary | ICD-10-CM | POA: Diagnosis not present

## 2024-08-02 DIAGNOSIS — N946 Dysmenorrhea, unspecified: Secondary | ICD-10-CM

## 2024-08-02 LAB — T4, FREE: Free T4: 1.1 ng/dL (ref 0.8–1.8)

## 2024-08-02 LAB — TSH: TSH: 0.33 m[IU]/L — ABNORMAL LOW

## 2024-08-02 LAB — T3, FREE: T3, Free: 3 pg/mL (ref 2.3–4.2)

## 2024-08-02 MED ORDER — IBUPROFEN 800 MG PO TABS: 800.0000 mg | ORAL_TABLET | Freq: Three times a day (TID) | ORAL | 3 refills | Status: AC | PRN

## 2024-08-02 NOTE — Progress Notes (Signed)
 ANNUAL EXAM Patient name: Judith Moore MRN [id number]  Date of birth: [birth year] Chief Complaint:   No chief complaint on file.  History of Present Illness:   Judith Moore is a 38 y.o. G4P1102  female being seen today for a routine annual exam.  Current complaints: reports changes in her period the last two months. Reports period has been heavier than usual with clots. She has a history of heavy periods and had IUD that worked well. She reports painful periods with bloating stomach and back pain as well as nausea.. Does not like to take medication but will occasionally take Ibuprofen . Her husband had vasectomy and she has been off birth control for two years. This last cycle she reports started two weeks late.   She has history of Graves disease, was just seen by her endocrinologist today and they checked labs   Patient's last menstrual period was 07/05/2024 (exact date).   The pregnancy intention screening data noted above was reviewed. Potential methods of contraception were discussed. The patient elected to proceed with No data recorded.   Gynecologic History  Contraception: vasectomy  Last Pap: 2023. Results were: NILM Last mammogram:n/a     08/02/2024    1:17 PM 05/05/2024    2:01 PM 09/28/2023    8:00 AM 06/21/2023    8:24 AM 04/02/2023    7:32 AM  Depression screen PHQ 2/9  Decreased Interest 2 1 0 3 2  Down, Depressed, Hopeless 1 1 0 3 2  PHQ - 2 Score 3 2 0 6 4  Altered sleeping 1 1 0 1 1  Tired, decreased energy 3 2 0 3 2  Change in appetite 0 2 0 1 2  Feeling bad or failure about yourself  1  0 1 1  Trouble concentrating 1 1 0 0 1  Moving slowly or fidgety/restless 0 0 0 0 0  Suicidal thoughts 0 0 0 0 0  PHQ-9 Score 9 8 0 12 11  Difficult doing work/chores  Somewhat difficult Not difficult at all  Somewhat difficult        08/02/2024    1:17 PM 05/05/2024    2:01 PM 09/28/2023    8:01 AM 06/21/2023    8:25 AM  GAD 7 : Generalized Anxiety Score   Nervous, Anxious, on Edge 1 2 0 3  Control/stop worrying 1 1 0 3  Worry too much - different things 0 1 0 3  Trouble relaxing 1 1 0 1  Restless 0 0 0 1  Easily annoyed or irritable 2 2 0 3  Afraid - awful might happen 1 0 0 0  Total GAD 7 Score 6 7 0 14  Anxiety Difficulty  Somewhat difficult Not difficult at all      Review of Systems:   Pertinent items are noted in HPI Denies any headaches, blurred vision, fatigue, shortness of breath, chest pain, abdominal pain, abnormal vaginal discharge/itching/odor/irritation, problems with periods, bowel movements, urination, or intercourse unless otherwise stated above. Pertinent History Reviewed:  Reviewed past medical,surgical, social and family history.  Reviewed problem list, medications and allergies. Physical Assessment:   Vitals:   08/02/24 1309 08/02/24 1320  BP: (!) 128/91 (!) 129/92  Pulse: 69 73  Weight: 211 lb 6.4 oz (95.9 kg)   Body mass index is 34.12 kg/m.        Physical Examination:   General appearance - well appearing, and in no distress  Mental status - alert, oriented   Psych:  She has a normal mood and affect  Skin - warm and dry, normal color  Chest - effort normal, all lung fields clear to auscultation bilaterally  Heart - normal rate and regular rhythm  Neck:  midline trachea  Breasts - breasts appear normal, no suspicious masses, no skin or nipple changes or  axillary nodes  Abdomen - soft, nontender, nondistended  Pelvic -deferred  Extremities:  No swelling or varicosities noted  Chaperone present for exam  No results found for this or any previous visit (from the past 24 hours).  Assessment & Plan:  1. Encounter for annual routine gynecological examination (Primary) UTD on pap Discussed mammogram next year, encourage self breast exam  2. Menorrhagia with regular cycle 3. Dysmenorrhea Discussed potential causes to change cycles. Discussed management options to include hormonal and non hormonal  options. She liked when she had Mirena  before and feels it helped with her cycles. When she got her third one, it was malpositioned. We discussed  trial with mirena  again, pain management options to include cervical block She desires to try IUD again Will utilize ibuprofen  in the meantime  - ibuprofen  (ADVIL ) 800 MG tablet; Take 1 tablet (800 mg total) by mouth every 8 (eight) hours as needed.  Dispense: 30 tablet; Refill: 3   Labs/procedures today:   Mammogram: @ 40yo, or sooner if problems Colonoscopy: @ 39yo, or sooner if problems  No orders of the defined types were placed in this encounter.   Meds:  Meds ordered this encounter  Medications   ibuprofen  (ADVIL ) 800 MG tablet    Sig: Take 1 tablet (800 mg total) by mouth every 8 (eight) hours as needed.    Dispense:  30 tablet    Refill:  3    Follow-up: Return for IUD insertion with cervical block  Future Appointments  Date Time Provider Department Center  09/04/2024  8:35 AM Erik Kieth BROCKS, MD [facility] None  02/01/2025  7:30 AM Shamleffer, Donell Cardinal, MD [facility] None     Nidia Daring, FNP

## 2024-08-02 NOTE — Progress Notes (Signed)
 PHQ of 9 and GAD of 6; pt states she already has a therapist that she sees regularly.   Here for aggressive periods for 8 months; No birth control for 2 years (partner had vasectomy).   Period last month was 2 weeks late. Painful periods with clots and nausea. She reports mood changes, heavy bleeding, and fatigue. Back pain.   BP 128/91 and 129/92

## 2024-08-02 NOTE — Progress Notes (Unsigned)
 Name: Judith Moore  MRN/ DOB: [id number], [birth year]    Age/ Sex: 38 y.o., female     PCP: [place], Betty G, MD   Reason for Endocrinology Evaluation: Hyperthyroidism     Initial Endocrinology Clinic Visit: 08/22/2015    PATIENT IDENTIFIER: Judith Moore is a 38 y.o., female with a past medical history of hyperthyroidism, dyslipidemia, S/P bariatric sx   . She has followed with [facility] since 08/22/2015 for consultative assistance with management of her Hyperthyroidism.   HISTORICAL SUMMARY: The patient was first diagnosed with hyperthyroidism in 2011 due to Graves' disease. She was on Methimazole  until 2018 but it was resumed in 2022 with a TSH 0.03 uIU/mL   Paternal grandmother with Graves' disease Mother with goiter    She was followed by Dr. Kassie from 2016 until 02/2022   She self discontinued methimazole  in January 2024  SUBJECTIVE:    Today (08/02/2024):  Judith Moore is here for a follow up on hyperthyroidism   Patient continues weight gain over the past year Continues with palpitations  No eye symptoms  Continues with chronic constipation  No local neck swelling  No tremors  Entertaining the idea of starting GLP-1 agonist through PCPs office, advised the patient she has no contraindications from the thyroid  standpoint The patient has been noted with changes in her menstrual cycle over the past 7 to 8 months, with more dysmenorrhea and heavy bleeding, she has an upcoming appointment with gynecology today  Methimazole  5 mg, 1 tablet 3 times a week- not taking   HISTORY:  Past Medical History:  Past Medical History:  Diagnosis Date   Allergies    Anxiety    Gallstones    Hyperthyroidism    Past Surgical History:  Past Surgical History:  Procedure Laterality Date   ADENOIDECTOMY     APPENDECTOMY  12/2015   CESAREAN SECTION  09/28/2005, 11/28/2007   CHOLECYSTECTOMY  04/2021   LAPAROSCOPIC APPENDECTOMY N/A 01/06/2016    Procedure: APPENDECTOMY LAPAROSCOPIC;  Surgeon: Donnice Lunger, MD;  Location: [facility];  Service: General;  Laterality: N/A;   stomach banding     TONSILLECTOMY     Social History:  reports that she has never smoked. She has never used smokeless tobacco. She reports current alcohol use. She reports current drug use. Drug: Marijuana. Family History:  Family History  Problem Relation Age of Onset   Cancer Paternal Grandfather    Diabetes Paternal Grandmother    Hypertension Maternal Grandmother    Hypertension Maternal Grandfather    Kidney failure Maternal Grandfather    Hypertension Mother    Thyroid  disease Mother        multinodular goiter   Healthy Brother        x1   Diabetes Maternal Uncle    Heart disease Neg Hx    Colon cancer Neg Hx      HOME MEDICATIONS: Allergies as of 08/02/2024       Reactions   Almond (diagnostic) Hives, Swelling   Almonds   Wellbutrin  [bupropion ] Hives   Lip        Medication List        Accurate as of August 02, 2024 11:22 AM. If you have any questions, ask your nurse or doctor.          clobetasol ointment 0.05 % Commonly known as: TEMOVATE Apply to affected area in scalp 3-4 times per week. Not to face.   Dapsone 7.5 % Gel Apply to face  each morning   FLUoxetine  10 MG capsule Commonly known as: PROZAC  Take 1 capsule (10 mg total) by mouth daily.   FLUoxetine  20 MG capsule Commonly known as: PROZAC  TAKE 1 CAPSULE BY MOUTH EVERY DAY   methimazole  5 MG tablet Commonly known as: TAPAZOLE  Take 1 tablet (5 mg total) by mouth as directed. 1 tablet 3 days a week   metroNIDAZOLE  500 MG tablet Commonly known as: FLAGYL  TAKE 1 TABLET BY MOUTH TWICE A DAY   minoxidil 2.5 MG tablet Commonly known as: LONITEN Take by mouth.   MULTIVITAMIN PO Take 1 tablet by mouth daily at 6 (six) AM.   spironolactone 50 MG tablet Commonly known as: ALDACTONE Take 1 tablet by mouth daily.   tretinoin 0.025 % cream Commonly known as:  RETIN-A Apply to face nightly to tolerance          OBJECTIVE:   PHYSICAL EXAM: VS: BP 120/84 (BP Location: Left Arm, Patient Position: Sitting, Cuff Size: Normal)   Pulse 84   Ht 5' 6 (1.676 m)   Wt 211 lb (95.7 kg)   BMI 34.06 kg/m    Filed Weights   08/02/24 1107  Weight: 211 lb (95.7 kg)     EXAM: General: Pt appears well and is in NAD  Neck: General: Supple without adenopathy. Thyroid : Prominent thyroid  gland  Lungs: Clear with good BS bilat   Heart: Auscultation: RRR.  Abdomen: soft, nontender  Extremities:  BL LE: No pretibial edema   Mental Status: Judgment, insight: Intact Orientation: Oriented to time, place, and person Mood and affect: No depression, anxiety, or agitation       DATA REVIEWED:  Latest Reference Range & Units 08/03/23 11:23  TSH 0.35 - 5.50 uIU/mL 0.31 (L)  Triiodothyronine,Free,Serum 2.3 - 4.2 pg/mL 3.5  T4,Free(Direct) 0.60 - 1.60 ng/dL 9.17      Latest Reference Range & Units 11/26/21 08:00  Sodium 135 - 145 mEq/L 139  Potassium 3.5 - 5.1 mEq/L 4.1  Chloride 96 - 112 mEq/L 106  CO2 19 - 32 mEq/L 29  Glucose 70 - 99 mg/dL 78  BUN 6 - 23 mg/dL 11  Creatinine 9.59 - 8.79 mg/dL 9.00  Calcium 8.4 - 89.4 mg/dL 8.9  Alkaline Phosphatase 39 - 117 U/L 40  Albumin 3.5 - 5.2 g/dL 3.9  AST 0 - 37 U/L 14  ALT 0 - 35 U/L 13  Total Protein 6.0 - 8.3 g/dL 6.8  Total Bilirubin 0.2 - 1.2 mg/dL 0.6  GFR >39.99 mL/min 73.42     ASSESSMENT / PLAN / RECOMMENDATIONS:   Hyperthyroidism   - Pt is clinically euthyroid - No local neck symptoms  -She self discontinued methimazole  in January 2024, TSH remains low, she did have eye symptoms and I have recommended restarting methimazole  at the smaller dose as below  Medications  Restart methimazole  5 mg 3 times a week    2. Graves' Disease:   - Pt with burning and itching sensation of the eyes - She follows with optometry   F/U in 6 months   Signed electronically by: Stefano Redgie Butts, MD  [facility]  [facility] [address] [place] [place], [place] [address] Phone: [phone] FAX: [phone]      CC: [place], Betty G, MD [address] [place] [address] Phone: [phone]  Fax: [phone]   Return to Endocrinology clinic as below: Future Appointments  Date Time Provider Department Center  08/02/2024  1:10 PM Judith Moore  L, FNP [facility] None  02/01/2025  7:30 AM Judith Moore, Donell Cardinal, MD [facility] None

## 2024-08-03 ENCOUNTER — Ambulatory Visit: Payer: Self-pay | Admitting: Internal Medicine

## 2024-08-10 ENCOUNTER — Ambulatory Visit: Payer: Self-pay

## 2024-08-10 NOTE — Telephone Encounter (Signed)
 FYI Only or Action Required?: FYI only for provider: appointment scheduled on 08/11/24.  Patient was last seen in primary care on 05/05/2024 by Jordan, Betty G, MD.  Called Nurse Triage reporting Cough and Otalgia.  Symptoms began several weeks ago.  Interventions attempted: OTC medications: mucinex DM and Rest, hydration, or home remedies.  Symptoms are: gradually worsening.  Triage Disposition: See Physician Within 24 Hours  Patient/caregiver understands and will follow disposition?: Yes   Copied from CRM #[id number]. Topic: Clinical - Red Word Triage >> Aug 10, 2024 11:50 AM Harlene ORN wrote: Red Word that prompted transfer to Nurse Triage: two weeks now bad cough discolored dark mucus muffled hearing in her ears/ feels like there is fluid trapped in there - causing vertigo Reason for Disposition  [place]  Answer Assessment - Initial Assessment Questions 1. ONSET: When did the cough begin?      2 weeks ago, productive X 3 days 2. SEVERITY: How bad is the cough today?      Moderate, having coughing fits 3. SPUTUM: Describe the color of your sputum (e.g., none, dry cough; clear, white, yellow, green)     Dark and thick  4. HEMOPTYSIS: Are you coughing up any blood? If Yes, ask: How much? (e.g., flecks, streaks, tablespoons, etc.)     Denies  5. DIFFICULTY BREATHING: Are you having difficulty breathing? If Yes, ask: How bad is it? (e.g., mild, moderate, severe)      Denies  6. FEVER: Do you have a fever? If Yes, ask: What is your temperature, how was it measured, and when did it start?     Denies  7. CARDIAC HISTORY: Do you have any history of heart disease? (e.g., heart attack, congestive heart failure)       8. LUNG HISTORY: Do you have any history of lung disease?  (e.g., pulmonary embolus, asthma, emphysema)      9. PE RISK FACTORS: Do you have a history of blood clots? (or: recent major surgery, recent prolonged travel, bedridden)      10. OTHER  SYMPTOMS: Do you have any other symptoms? (e.g., runny nose, wheezing, chest pain)       Ear pain and congestion, intermittent dizziness, sob with stairs  Protocols used: Cough - Acute Productive-A-AH

## 2024-08-11 ENCOUNTER — Ambulatory Visit: Admitting: Family Medicine

## 2024-09-04 ENCOUNTER — Other Ambulatory Visit: Payer: Self-pay

## 2024-09-04 ENCOUNTER — Ambulatory Visit (INDEPENDENT_AMBULATORY_CARE_PROVIDER_SITE_OTHER): Admitting: Obstetrics and Gynecology

## 2024-09-04 VITALS — BP 130/87 | HR 81 | Ht 66.0 in | Wt 220.1 lb

## 2024-09-04 DIAGNOSIS — N882 Stricture and stenosis of cervix uteri: Secondary | ICD-10-CM | POA: Diagnosis not present

## 2024-09-04 DIAGNOSIS — N939 Abnormal uterine and vaginal bleeding, unspecified: Secondary | ICD-10-CM

## 2024-09-04 DIAGNOSIS — Z30430 Encounter for insertion of intrauterine contraceptive device: Secondary | ICD-10-CM

## 2024-09-04 DIAGNOSIS — Z3202 Encounter for pregnancy test, result negative: Secondary | ICD-10-CM

## 2024-09-04 LAB — POCT URINE PREGNANCY: Preg Test, Ur: NEGATIVE

## 2024-09-04 MED ORDER — LEVONORGESTREL 20 MCG/DAY IU IUD
1.0000 | INTRAUTERINE_SYSTEM | Freq: Once | INTRAUTERINE | Status: AC
  Administered 2024-09-04 (×2): 1 via INTRAUTERINE

## 2024-09-04 MED ORDER — LEVONORGESTREL 20 MCG/DAY IU IUD: 1.0000 | INTRAUTERINE_SYSTEM | Freq: Once | INTRAUTERINE | Status: AC

## 2024-09-04 MED ORDER — DROSPIRENONE-ETHINYL ESTRADIOL 3-0.02 MG PO TABS: 1.0000 | ORAL_TABLET | Freq: Every day | ORAL | 3 refills | Status: AC

## 2024-09-04 NOTE — Progress Notes (Signed)
    GYNECOLOGY OFFICE PROCEDURE NOTE  Judith Moore is a 38 y.o. [id number] here for IUD insertion for heavy, painful regular periods. Has had good control with IUD in past but has retroflexed uterus so had a malpositioned IUD last time she had one.  Last pap smear was on 11/25/21 and was normal (NILM/HPV neg).  IUD Insertion Procedure Note Procedure: IUD insertion with Mirena  UPT: Negative GC/CT testing: Offered and declines  Patient identified.  Risks, benefits and alternatives of procedure were discussed including irregular bleeding, cramping, infection, malpositioning or misplacement of the IUD outside the uterus which may require further procedure such as laparoscopy. Also discussed >99% contraception efficacy, increased risk of ectopic pregnancy with failure of method.   Emphasized that this did not protect against STIs, condoms recommended during all sexual encounters. Consent signed. Time out performed.   Bimanual exam with retroflexed uterus - confirmed on prior ultrasound. Speculum inserted and cervix visualized, prepped with 3 swabs of betadine. 2cc of 1% lidocaine  were injected intracervically at 12 o'clock. The cervix was grasped with a tenaculum. Os finder needed to dilate cervix. Attempted to pass IUD, but only went to depth of 5cm (prior US  w/ depth 8cm). IUD was removed. Attempted to use iPad ultrasound program for ultrasound guidance but no good acoustic window. Procedure was paused and all instruments were removed.   Allowed bladder to fill. Could now see retroflexed uterus on iPad US . Cervix was re-prepped with betadine. 3cc of intracervical lidocaine  1% injected at 12 o;clock. Uterus grasped with tenaculum. IUD inserted to a depth of 7cm - it looked fundal on iPad US  but it was not entirely clear. Strings were trimmed to 3cm and all instruments were removed.   A transvaginal pelvic ultrasound was performed with confirmed IUD had not been placed at the fundus and had not been able  to make that retroflexed turn despite our best efforts. Discussed how some IUDs will move to a fundal location with time so offered observation versus removal. She preferred removal. A speculum was placed. Strings were grasped with ring forceps and IUD was removed intact without difficulty.   She does not have contraindications to estrogen - no migraine w/ aura, HTN, hx VTE, tobacco use. Will trial Yaz as she also notes mood changes around her cycle and this can help treat PMDD. Discussed potential elevated risk for VTE but not enough studies to fully support this claim when comparing to other combined oral contraceptives. She is in agreement with the plan. Rx sent.   Discussed concerning signs/symptoms and to call if heavy bleeding, severe abdominal pain, or fever in the following 3 weeks. Manufacturer pamphlet/patient information given. Reviewed timing of efficacy for contraception and to use an alternative form of birth control until that time.  Follow up in 3 months for re-assessment for regular but heavy periods  Kieth Carolin, MD Obstetrician & Gynecologist, [facility] for [employer], [facility]

## 2024-09-04 NOTE — Progress Notes (Signed)
 Pt presents for IUD insert. Pt requests a f/p u/s to make sure its in place. No other questions or concerns at this time.

## 2024-09-14 DIAGNOSIS — L700 Acne vulgaris: Secondary | ICD-10-CM | POA: Diagnosis not present

## 2024-09-14 DIAGNOSIS — L6681 Central centrifugal cicatricial alopecia: Secondary | ICD-10-CM | POA: Diagnosis not present

## 2024-10-02 ENCOUNTER — Encounter: Admitting: Family Medicine

## 2024-10-17 ENCOUNTER — Ambulatory Visit: Admitting: Family Medicine

## 2024-10-20 ENCOUNTER — Ambulatory Visit: Admitting: Family Medicine

## 2024-10-20 ENCOUNTER — Encounter: Payer: Self-pay | Admitting: Family Medicine

## 2024-10-20 VITALS — BP 140/90 | HR 64 | Temp 98.1°F | Resp 16 | Ht 66.0 in | Wt 214.0 lb

## 2024-10-20 DIAGNOSIS — E785 Hyperlipidemia, unspecified: Secondary | ICD-10-CM

## 2024-10-20 DIAGNOSIS — R7303 Prediabetes: Secondary | ICD-10-CM

## 2024-10-20 DIAGNOSIS — R030 Elevated blood-pressure reading, without diagnosis of hypertension: Secondary | ICD-10-CM

## 2024-10-20 DIAGNOSIS — D509 Iron deficiency anemia, unspecified: Secondary | ICD-10-CM | POA: Diagnosis not present

## 2024-10-20 DIAGNOSIS — R0789 Other chest pain: Secondary | ICD-10-CM | POA: Diagnosis not present

## 2024-10-20 DIAGNOSIS — R002 Palpitations: Secondary | ICD-10-CM | POA: Diagnosis not present

## 2024-10-20 LAB — LIPID PANEL
Cholesterol: 183 mg/dL (ref 28–200)
HDL: 70.9 mg/dL
LDL Cholesterol: 98 mg/dL (ref 10–99)
NonHDL: 111.85
Total CHOL/HDL Ratio: 3
Triglycerides: 70 mg/dL (ref 10.0–149.0)
VLDL: 14 mg/dL (ref 0.0–40.0)

## 2024-10-20 LAB — HEMOGLOBIN A1C: Hgb A1c MFr Bld: 5.8 % (ref 4.6–6.5)

## 2024-10-20 LAB — CBC
HCT: 37 % (ref 36.0–46.0)
Hemoglobin: 12.1 g/dL (ref 12.0–15.0)
MCHC: 32.7 g/dL (ref 30.0–36.0)
MCV: 82.9 fl (ref 78.0–100.0)
Platelets: 310 K/uL (ref 150.0–400.0)
RBC: 4.47 Mil/uL (ref 3.87–5.11)
RDW: 13.3 % (ref 11.5–15.5)
WBC: 6.6 K/uL (ref 4.0–10.5)

## 2024-10-20 LAB — BASIC METABOLIC PANEL WITH GFR
BUN: 7 mg/dL (ref 6–23)
CO2: 27 meq/L (ref 19–32)
Calcium: 8.8 mg/dL (ref 8.4–10.5)
Chloride: 104 meq/L (ref 96–112)
Creatinine, Ser: 0.79 mg/dL (ref 0.40–1.20)
GFR: 94.31 mL/min
Glucose, Bld: 84 mg/dL (ref 70–99)
Potassium: 3.7 meq/L (ref 3.5–5.1)
Sodium: 137 meq/L (ref 135–145)

## 2024-10-20 MED ORDER — METOPROLOL SUCCINATE ER 25 MG PO TB24: 25.0000 mg | ORAL_TABLET | Freq: Every day | ORAL | 1 refills | Status: DC

## 2024-10-20 NOTE — Patient Instructions (Addendum)
 A few things to remember from today's visit:  Hyperlipidemia, unspecified hyperlipidemia type - Plan: Lipid panel  Iron deficiency anemia, unspecified iron deficiency anemia type  Palpitations - Plan: Ambulatory referral to Cardiology, Basic metabolic panel with GFR, CBC  Other chest pain - Plan: Ambulatory referral to Cardiology  Prediabetes - Plan: Hemoglobin A1c  Start Metoprolol  succinate 25 mg 1/2 tab daily. Monitor blood pressure at home. Do not use My Chart to request refills or for acute issues that need immediate attention. If you send a my chart message, it may take a few days to be addressed, specially if I am not in the office.  Please be sure medication list is accurate. If a new problem present, please set up appointment sooner than planned today.

## 2024-10-20 NOTE — Progress Notes (Unsigned)
 "  ACUTE VISIT Chief Complaint  Patient presents with   Acute Visit    Patient wants a referral to see a cardiologist.    HPI: [name] is a 39 y.o. female, who is here today complaining of ***  Discussed the use of AI scribe software for clinical note transcription with the patient, who gave verbal consent to proceed.  History of Present Illness Judith Moore is a 39 year old female with anxiety and Graves disease who presents with chest pain, palpitations, and shortness of breath.  Judith Moore experiences chest pain, palpitations, and shortness of breath, particularly when walking up steps or performing household chores. These symptoms have persisted for over a year, with the chest pain located centrally and sometimes radiating to her arm. Shortness of breath occurs with exertion and improves after resting for about ten minutes.  Judith Moore has a history of anxiety, which Judith Moore feels is better controlled through psychotherapy. Judith Moore has previously been on fluoxetine , which exacerbated her anxiety, and has tried other medications like sertraline  and lidotran , all of which caused adverse reactions. Judith Moore is currently not on any anxiety medication and relies on psychotherapy for management.  Her blood pressure has been elevated since October, with readings higher than her normal range, including a recent reading of 91. Judith Moore does not monitor her blood pressure at home. Judith Moore has a family history of hypertension.  Judith Moore has a history of Graves disease, which was previously associated with her chest pain and palpitations. Judith Moore reports that her numbers were a little bit elevated at her last endocrinology visit, but not enough to need treatment again, and Judith Moore follows up with endocrinology every six months. Judith Moore has been on atenolol  and metoprolol  in the past, which helped with palpitations, especially when her thyroid  was uncontrolled.  Judith Moore experienced a severe allergic reaction on New Year's Day after consuming  almonds unknowingly, resulting in a breakout. Judith Moore managed the reaction with Benadryl .  Judith Moore reports edema around her ankles, particularly towards the end of the day, and has a history of anemia. Judith Moore also notes dehydration recently due to increased workload.  Lab Results  Component Value Date   TSH 0.33 (L) 08/02/2024   HLD: *** Lab Results  Component Value Date   CHOL 173 09/28/2023   HDL 65.10 09/28/2023   LDLCALC 95 09/28/2023   TRIG 66.0 09/28/2023   CHOLHDL 3 09/28/2023   Lab Results  Component Value Date   NA 138 09/28/2023   CL 104 09/28/2023   K 3.9 09/28/2023   CO2 27 09/28/2023   BUN 9 09/28/2023   CREATININE 0.76 09/28/2023   GFR 99.54 09/28/2023   CALCIUM 8.9 09/28/2023   ALBUMIN 3.9 11/26/2021   GLUCOSE 86 09/28/2023   Review of Systems See other pertinent positives and negatives in HPI.  Medications Ordered Prior to Encounter[1]  Past Medical History:  Diagnosis Date   Allergies    Anxiety    Gallstones    Hyperthyroidism    Allergies[2]  Social History   Socioeconomic History   Marital status: Divorced    Spouse name: SEPARATED   Number of children: 2   Years of education: 15+   Highest education level: Not on file  Occupational History   Occupation: HEALTH CARE BILLING SPECIALIST    Employer: [employer]  Tobacco Use   Smoking status: Some Days    Types: Cigars   Smokeless tobacco: Never  Vaping Use   Vaping status: Never Used  Substance and Sexual  Activity   Alcohol use: Yes    Comment: occasional   Drug use: Yes    Types: Marijuana   Sexual activity: Yes    Partners: Male    Birth control/protection: None  Other Topics Concern   Not on file  Social History Narrative   Lives with her two sons, her uncle and cousin. Separated from husband 11/2010.   Social Drivers of Health   Tobacco Use: High Risk (10/20/2024)   Patient History    Smoking Tobacco Use: Some Days    Smokeless Tobacco Use: Never    Passive Exposure: Not on file   Financial Resource Strain: Not on file  Food Insecurity: Not on file  Transportation Needs: Not on file  Physical Activity: Not on file  Stress: Not on file  Social Connections: Not on file  Depression (PHQ2-9): Medium Risk (08/02/2024)   Depression (PHQ2-9)    PHQ-2 Score: 9  Alcohol Screen: Not on file  Housing: Not on file  Utilities: Not on file  Health Literacy: Not on file    Vitals:   10/20/24 1333 10/20/24 1409  BP: (!) 140/90 (!) 140/90  Pulse: 64   Resp: 16   Temp: 98.1 F (36.7 C)    Body mass index is 34.54 kg/m.  Physical Exam Vitals and nursing note reviewed.  Constitutional:      General: Judith Moore is not in acute distress.    Appearance: Judith Moore is well-developed.  HENT:     Head: Normocephalic and atraumatic.     Mouth/Throat:     Mouth: Mucous membranes are moist.     Pharynx: Oropharynx is clear.  Eyes:     Conjunctiva/sclera: Conjunctivae normal.  Cardiovascular:     Rate and Rhythm: Normal rate and regular rhythm.     Pulses:          Dorsalis pedis pulses are 2+ on the right side and 2+ on the left side.     Heart sounds: No murmur heard.    Comments: Varicose veins LE's, bilateral Pulmonary:     Effort: Pulmonary effort is normal. No respiratory distress.     Breath sounds: Normal breath sounds.  Abdominal:     Palpations: Abdomen is soft. There is no hepatomegaly or mass.     Tenderness: There is no abdominal tenderness.  Musculoskeletal:     Right lower leg: No edema.     Left lower leg: No edema.  Lymphadenopathy:     Cervical: No cervical adenopathy.  Skin:    General: Skin is warm.     Findings: No erythema or rash.  Neurological:     General: No focal deficit present.     Mental Status: Judith Moore is alert and oriented to person, place, and time.     Cranial Nerves: No cranial nerve deficit.     Gait: Gait normal.  Psychiatric:        Mood and Affect: Mood and affect normal.     ASSESSMENT AND PLAN:  Judith Moore was seen today for acute  visit.  Diagnoses and all orders for this visit:  Hyperlipidemia, unspecified hyperlipidemia type -     Lipid panel; Future -     Lipid panel  Iron deficiency anemia, unspecified iron deficiency anemia type  Palpitations -     Ambulatory referral to Cardiology -     Basic metabolic panel with GFR; Future -     CBC; Future -     metoprolol  succinate (TOPROL -XL) 25 MG 24 hr tablet;  Take 1 tablet (25 mg total) by mouth daily. -     CBC -     Basic metabolic panel with GFR  Other chest pain -     Ambulatory referral to Cardiology  Prediabetes -     Hemoglobin A1c; Future -     Hemoglobin A1c  Elevated blood pressure reading    Orders Placed This Encounter  Procedures   Basic metabolic panel with GFR   Hemoglobin A1c   Lipid panel   CBC   Ambulatory referral to Cardiology    No problem-specific Assessment & Plan notes found for this encounter.    Return if symptoms worsen or fail to improve, for keep next appointment.  Lesly Pontarelli G. Tykera Skates, MD  [facility]. [place] office.     [1]  Current Outpatient Medications on File Prior to Visit  Medication Sig Dispense Refill   clobetasol ointment (TEMOVATE) 0.05 % Apply to affected area in scalp 3-4 times per week. Not to face.     ibuprofen  (ADVIL ) 800 MG tablet Take 1 tablet (800 mg total) by mouth every 8 (eight) hours as needed. 30 tablet 3   tretinoin (RETIN-A) 0.025 % cream Apply to face nightly to tolerance     Dapsone 7.5 % GEL Apply to face each morning (Patient not taking: Reported on 10/20/2024)     drospirenone -ethinyl estradiol  (YAZ) 3-0.02 MG tablet Take 1 tablet by mouth daily. (Patient not taking: Reported on 10/20/2024) 84 tablet 3   methimazole  (TAPAZOLE ) 5 MG tablet Take 1 tablet (5 mg total) by mouth as directed. 1 tablet 3 days a week (Patient not taking: Reported on 10/20/2024) 39 tablet 2   metroNIDAZOLE  (FLAGYL ) 500 MG tablet TAKE 1 TABLET BY MOUTH TWICE A DAY (Patient not taking: Reported on  10/20/2024) 14 tablet 2   minoxidil (LONITEN) 2.5 MG tablet Take by mouth. (Patient not taking: Reported on 10/20/2024)     Multiple Vitamin (MULTIVITAMIN PO) Take 1 tablet by mouth daily at 6 (six) AM. (Patient not taking: Reported on 10/20/2024)     spironolactone (ALDACTONE) 50 MG tablet Take 1 tablet by mouth daily. (Patient not taking: Reported on 10/20/2024)     Current Facility-Administered Medications on File Prior to Visit  Medication Dose Route Frequency Provider Last Rate Last Admin   levonorgestrel  (MIRENA ) 20 MCG/DAY IUD 1 each  1 each Intrauterine Once Erik Kieth BROCKS, MD      [2]  Allergies Allergen Reactions   Almond (Diagnostic) Hives and Swelling    Almonds   Wellbutrin  [Bupropion ] Hives    Lip   "

## 2024-10-21 ENCOUNTER — Ambulatory Visit: Payer: Self-pay | Admitting: Family Medicine

## 2024-10-21 ENCOUNTER — Encounter: Payer: Self-pay | Admitting: Family Medicine

## 2024-10-21 NOTE — Assessment & Plan Note (Signed)
 She is not on iron supplementation. Last H/H in 09/2023 were 12.3/37.7 Further recommendations according to CBC results.

## 2024-10-21 NOTE — Assessment & Plan Note (Signed)
 She is on non pharmacologic treatment. Further recommendation will be given according to lipid panel result.

## 2024-10-30 ENCOUNTER — Encounter: Admitting: Family Medicine

## 2024-11-06 ENCOUNTER — Ambulatory Visit: Admitting: Physician Assistant

## 2024-11-14 ENCOUNTER — Other Ambulatory Visit: Payer: Self-pay

## 2024-11-14 DIAGNOSIS — R002 Palpitations: Secondary | ICD-10-CM

## 2024-11-14 MED ORDER — METOPROLOL SUCCINATE ER 25 MG PO TB24: 25.0000 mg | ORAL_TABLET | Freq: Every day | ORAL | 3 refills | Status: AC

## 2024-11-16 NOTE — Progress Notes (Unsigned)
 " Cardiology [facility] Office Note:    Date:  11/17/2024   ID:  Judith Moore, DOB July 20, 1986, MRN [id number]  PCP:  Jordan, Betty G, MD   [facility] Providers Cardiologist:  Georganna Archer, MD     Referring MD: Jordan, Betty G, MD   Chief Complaint  Patient presents with   New Patient (Initial Visit)    Chest pain, palpitations    History of Present Illness:    Judith Moore is a 39 y.o. female with a hx of HTN, HLD, obesity, GERD, hyperthyroidism/Grave's disease. Had gastric sleeve in 2016.   She was recently seen by PCP 10/20/24 and reported chest pain, SOB, and palpitations. She was referred to OP cardiology for evaluation.   She has previously taken metoprolol  and propranolol for palpitations. She is currently prescribed 25 mg toprol  but is not taking daily. She has noted elevated BP during her last several medical provider visits.   She describes DOE when climbing stairs to second floor in her home - started 1-2 months ago. Palpitations have worsened since 4-6 months ago. Palpitations occur 4 days per week, last about 10 minutes and associated with dizziness. Stress can worsen palpitations. No relieving factors, tries to mitigate by drinking cold water but not successful. Palpitations sometimes relieved with rest. No syncope. She also reports intermittent chest pain that feels like a muscle strain. CP started 3-4 months ago, occurs 2 times per week, always in the center of her chest, CP lasts 5 minutes. CP not always exertional, many times occurs at rest.   Hx of anxiety, not currently on medications. Battling headaches recently.   DOE or chest pain does not always occur when exercising with walking the neighborhood.    Social history: Currently working as education officer, environmental.   Lives at home with two sons, age 76 and 70.   Physical activity: no dedicated exercise regimen. She can walk on the treadmill and walks the neighborhood.   Tobacco use:  never Alcohol use: 1 drink per week THC containing products: yes - smoking three times per week Illicit drugs/IVDU: no illicit drugs  Family history: Mother - HTN, brain aneurysm Maternal great aunt x 2 both had PE's  Father - no heart issues   Past Medical History:  Diagnosis Date   Allergies    Anxiety    Gallstones    Hyperthyroidism     Past Surgical History:  Procedure Laterality Date   ADENOIDECTOMY     APPENDECTOMY  12/2015   CESAREAN SECTION  09/28/2005, 11/28/2007   CHOLECYSTECTOMY  04/2021   LAPAROSCOPIC APPENDECTOMY N/A 01/06/2016   Procedure: APPENDECTOMY LAPAROSCOPIC;  Surgeon: Donnice Lunger, MD;  Location: [facility];  Service: General;  Laterality: N/A;   stomach banding     TONSILLECTOMY      Current Medications: Active Medications[1]   Allergies:   Almond (diagnostic) and Wellbutrin  [bupropion ]   Social History   Socioeconomic History   Marital status: Divorced    Spouse name: SEPARATED   Number of children: 2   Years of education: 15+   Highest education level: Not on file  Occupational History   Occupation: HEALTH CARE BILLING SPECIALIST    Employer: [employer]  Tobacco Use   Smoking status: Some Days    Types: Cigars   Smokeless tobacco: Never  Vaping Use   Vaping status: Never Used  Substance and Sexual Activity   Alcohol use: Yes    Comment: occasional   Drug use: Yes  Types: Marijuana   Sexual activity: Yes    Partners: Male    Birth control/protection: None  Other Topics Concern   Not on file  Social History Narrative   Lives with her two sons, her uncle and cousin. Separated from husband 11/2010.   Social Drivers of Health   Tobacco Use: High Risk (10/20/2024)   Patient History    Smoking Tobacco Use: Some Days    Smokeless Tobacco Use: Never    Passive Exposure: Not on file  Financial Resource Strain: Not on file  Food Insecurity: Not on file  Transportation Needs: Not on file  Physical Activity: Not on file  Stress: Not  on file  Social Connections: Not on file  Depression (PHQ2-9): Medium Risk (08/02/2024)   Depression (PHQ2-9)    PHQ-2 Score: 9  Alcohol Screen: Not on file  Housing: Not on file  Utilities: Not on file  Health Literacy: Not on file     Family History: The patient's family history includes Cancer in her paternal grandfather; Diabetes in her maternal uncle and paternal grandmother; Healthy in her brother; Hypertension in her maternal grandfather, maternal grandmother, and mother; Kidney failure in her maternal grandfather; Thyroid  disease in her mother. There is no history of Heart disease or Colon cancer.  ROS:   Please see the history of present illness.     All other systems reviewed and are negative.  EKGs/Labs/Other Studies Reviewed:    The following studies were reviewed today:  EKG Interpretation Date/Time:  Friday November 17 2024 10:05:21 EST Ventricular Rate:  73 PR Interval:  154 QRS Duration:  76 QT Interval:  360 QTC Calculation: 396 R Axis:   27  Text Interpretation: Normal sinus rhythm Normal ECG When compared with ECG of 06-Dec-2020 13:15, No significant change was found Confirmed by Madie Slough ([id number]) on 11/17/2024 10:10:02 AM    Recent Labs: 08/02/2024: TSH 0.33 10/20/2024: BUN 7; Creatinine, Ser 0.79; Hemoglobin 12.1; Platelets 310.0; Potassium 3.7; Sodium 137  Recent Lipid Panel    Component Value Date/Time   CHOL 183 10/20/2024 1411   TRIG 70.0 10/20/2024 1411   HDL 70.90 10/20/2024 1411   CHOLHDL 3 10/20/2024 1411   VLDL 14.0 10/20/2024 1411   LDLCALC 98 10/20/2024 1411   LDLCALC 93 07/29/2020 0819     Risk Assessment/Calculations:             Physical Exam:    VS:  BP (!) 140/94   Pulse 73   Ht 5' 6 (1.676 m)   Wt 214 lb 0.6 oz (97.1 kg)   LMP 09/25/2024 (Exact Date)   BMI 34.55 kg/m     Wt Readings from Last 3 Encounters:  11/17/24 214 lb 0.6 oz (97.1 kg)  10/20/24 214 lb (97.1 kg)  09/04/24 220 lb 1.6 oz (99.8 kg)     GEN:   Well nourished, well developed in no acute distress HEENT: Normal NECK: No JVD; No carotid bruits LYMPHATICS: No lymphadenopathy CARDIAC: RRR, soft diastolic murmur RESPIRATORY:  Clear to auscultation without rales, wheezing or rhonchi  ABDOMEN: Soft, non-tender, non-distended MUSCULOSKELETAL:  No edema; No deformity  SKIN: Warm and dry NEUROLOGIC:  Alert and oriented x 3 PSYCHIATRIC:  Normal affect   ASSESSMENT:    1. Chest pain of uncertain etiology   2. Palpitations   3. Precordial pain   4. Hyperlipidemia with target LDL less than 70   5. Hyperthyroidism   6. Anxiety disorder, unspecified type    PLAN:  In order of problems listed above:  Chest pain - A1c 5.8% - LDL 98 (10/2024) - somewhat atypical but given history, will obtain CT coronary with home 25 mg toprol  (HR 72 today and has not taken toprol  yet) - unable to exercise due to DOE   Palpitations - hx of hyperthyroidism, follows with endocrinology - reportedly stable thyroid  studies - currently on 25 mg toprol  - started 11/2024 but not taking daily, not necessarily helping palpitations - will place a 14 day heart monitor   Cardiac murmur - on exam - will obtain echo   Hypertension - taking 25 mg toprol  sporadically - BP elevated today - discussed many options, will start 25 mg losartan  and continue 25 mg toprol    Hyperlipidemia with LDL goal < 70 10/20/2024: Cholesterol 183; HDL 70.90; LDL Cholesterol 98; Triglycerides 70.0; VLDL 14.0 - will work on diet and exercise, will recheck in 3-6 months   Follow up with Dr. Floretta in 6 weeks.    Medication Adjustments/Labs and Tests Ordered: Current medicines are reviewed at length with the patient today.  Concerns regarding medicines are outlined above.  Orders Placed This Encounter  Procedures   CT CORONARY MORPH W/CTA COR W/SCORE W/CA W/CM &/OR WO/CM   Basic Metabolic Panel (BMET)   LONG TERM MONITOR (3-14 DAYS)   EKG 12-Lead   ECHOCARDIOGRAM  COMPLETE   No orders of the defined types were placed in this encounter.   Patient Instructions  Medication Instructions:  Your physician has recommended you make the following change in your medication:  START Losartan  25 mg taking 1 daily   *If you need a refill on your cardiac medications before your next appointment, please call your pharmacy*  Lab Work: 2 WEEKS GO TO [facility] FOR:  BMET  If you have labs (blood work) drawn today and your tests are completely normal, you will receive your results only by: MyChart Message (if you have MyChart) OR A paper copy in the mail If you have any lab test that is abnormal or we need to change your treatment, we will call you to review the results.  Testing/Procedures: Your physician has requested that you have an echocardiogram. Echocardiography is a painless test that uses sound waves to create images of your heart. It provides your doctor with information about the size and shape of your heart and how well your hearts chambers and valves are working. This procedure takes approximately one hour. There are no restrictions for this procedure. Please do NOT wear cologne, perfume, aftershave, or lotions (deodorant is allowed). Please arrive 15 minutes prior to your appointment time.  Please note: We ask at that you not bring children with you during ultrasound (echo/ vascular) testing. Due to room size and safety concerns, children are not allowed in the ultrasound rooms during exams. Our front office staff cannot provide observation of children in our lobby area while testing is being conducted. An adult accompanying a patient to their appointment will only be allowed in the ultrasound room at the discretion of the ultrasound technician under special circumstances. We apologize for any inconvenience.   Your physician has requested that you have cardiac CT. Cardiac computed tomography (CT) is a painless test that uses an x-ray machine to take  clear, detailed pictures of your heart. For further information please visit [email or web address] Please follow instruction BELOW:    Your cardiac CT will is scheduled at:   [facility]   Monday 2/23 arriving at 12:30 for 1pm  If scheduled at [facility], please arrive to the Heart and V[facility] Center 15 mins early for check-in and test prep.   Please follow these instructions carefully (unless otherwise directed):  An IV will be required for this test and Nitroglycerin will be given.    On the Night Before the Test: Be sure to Drink plenty of water. Do not consume any caffeinated/decaffeinated beverages or chocolate 12 hours prior to your test. Do not take any antihistamines 12 hours prior to your test.   On the Day of the Test: Drink plenty of water until 1 hour prior to the test. Do not eat any food 1 hour prior to test. You may take your regular medications prior to the test.  Take metoprolol  (Lopressor ) two hours prior to test. FEMALES- please wear underwire-free bra if available, avoid dresses & tight clothing        After the Test: Drink plenty of water. After receiving IV contrast, you may experience a mild flushed feeling. This is normal. On occasion, you may experience a mild rash up to 24 hours after the test. This is not dangerous. If this occurs, you can take Benadryl  25 mg, Zyrtec , Claritin, or Allegra and increase your fluid intake. (Patients taking Tikosyn should avoid Benadryl , and may take Zyrtec , Claritin, or Allegra) If you experience trouble breathing, this can be serious. If it is severe call 911 IMMEDIATELY. If it is mild, please call our office.  We will call to schedule your test 2-4 weeks out understanding that some insurance companies will need an authorization prior to the service being performed.   For more information and frequently asked questions, please visit our website :  [email or web address]  For non-scheduling related questions, please contact the cardiac imaging nurse navigator should you have any questions/concerns: Cardiac Imaging Nurse Navigators Direct Office Dial: [phone]   For scheduling needs, including cancellations and rescheduling, please call Brittany, [phone].  For billing questions, please call [phone].   ZIO AT Long term monitor-Live Telemetry  Your physician has requested you wear a ZIO patch monitor for 14 DAYS.  This is a single patch monitor. [employer] supplies one patch monitor per enrollment. Additional  stickers are not available.  Please do not apply patch if you will be having a Nuclear Stress Test, Echocardiogram, Cardiac CT, MRI,  or Chest Xray during the period you would be wearing the monitor. The patch cannot be worn during  these tests. You cannot remove and re-apply the ZIO AT patch monitor.  Your ZIO patch monitor will be mailed 3 day USPS to your address on file. It may take 3-5 days to  receive your monitor after you have been enrolled.  Once you have received your monitor, please review the enclosed instructions. Your monitor has  already been registered assigning a specific monitor serial # to you.   Billing and Patient Assistance Program information  Meredeth has been supplied with any insurance information on record for billing. [employer] offers a sliding scale Patient Assistance Program for patients without insurance, or whose  insurance does not completely cover the cost of the ZIO patch monitor. You must apply for the  Patient Assistance Program to qualify for the discounted rate. To apply, call [employer] at [phone],  select option 4, select option 2 , ask to apply for the Patient Assistance Program, (you can request an  interpreter if needed). [employer] will ask your household income and how many people are in your  household. Meredeth will quote your  out-of-pocket cost based on this  information. They will also be able  to set up a 12 month interest free payment plan if needed.  Applying the monitor   Shave hair from upper left chest.  Hold the abrader disc by orange tab. Rub the abrader in 40 strokes over left upper chest as indicated in  your monitor instructions.  Clean area with 4 enclosed alcohol pads. Use all pads to ensure the area is cleaned thoroughly. Let  dry.  Apply patch as indicated in monitor instructions. Patch will be placed under collarbone on left side of  chest with arrow pointing upward.  Rub patch adhesive wings for 2 minutes. Remove the white label marked 1. Remove the white label  marked 2. Rub patch adhesive wings for 2 additional minutes.  While looking in a mirror, press and release button in center of patch. A small green light will flash 3-4  times. This will be your only indicator that the monitor has been turned on.  Do not shower for the first 24 hours. You may shower after the first 24 hours.  Press the button if you feel a symptom. You will hear a small click. Record Date, Time and Symptom in  the Patient Log.   Starting the Gateway  In your kit there is a audiological scientist box the size of a cellphone. This is [employer]. It transmits all your  recorded data to [facility]. This box must always stay within 10 feet of you. Open the box and push the *  button. There will be a light that blinks orange and then green a few times. When the light stops  blinking, the Gateway is connected to the ZIO patch. Call [employer] at [phone] to confirm your monitor is transmitting.  Returning your monitor  Remove your patch and place it inside the Gateway. In the lower half of the Gateway there is a white  bag with prepaid postage on it. Place Gateway in bag and seal. Mail package back to [facility] as soon as  possible. Your physician should have your final report approximately 7 days after you have mailed back  your monitor. Call [facility] : [facility] Customer Care at [phone] if you have questions regarding your ZIO AT  patch monitor. Call them immediately if you see an orange light blinking on your monitor.  If your monitor falls off in less than 4 days, contact our Monitor department at [phone]. If your  monitor becomes loose or falls off after 4 days call [employer] at [phone] for suggestions on  securing your monitor  Follow-Up: At [facility], you and your health needs are our priority.  As part of our continuing mission to provide you with exceptional heart care, our providers are all part of one team.  This team includes your primary Cardiologist (physician) and Advanced Practice Providers or APPs (Physician Assistants and Nurse Practitioners) who all work together to provide you with the care you need, when you need it.  Your next appointment:   8 week(s)  Provider:   Georganna Archer, MD    We recommend signing up for the patient portal called MyChart.  Sign up information is provided on this After Visit Summary.  MyChart is used to connect with patients for Virtual Visits (Telemedicine).  Patients are able to view lab/test results, encounter notes, upcoming appointments, etc.  Non-urgent messages can be sent to your provider as well.   To learn more about what you can do with MyChart,  go to [email or web address].   Other Instructions               Signed, Jon Nat Hails, [place]  11/17/2024 10:59 AM    [facility]     [1]  No outpatient medications have been marked as taking for the 11/17/24 encounter (Office Visit) with Hails Jon Nat, PA.   Current Facility-Administered Medications for the 11/17/24 encounter (Office Visit) with Hails Jon Nat, PA  Medication   levonorgestrel  (MIRENA ) 20 MCG/DAY IUD 1 each   "

## 2024-11-17 ENCOUNTER — Encounter: Payer: Self-pay | Admitting: Family Medicine

## 2024-11-17 ENCOUNTER — Ambulatory Visit: Admitting: Physician Assistant

## 2024-11-17 ENCOUNTER — Ambulatory Visit: Admitting: Family Medicine

## 2024-11-17 ENCOUNTER — Ambulatory Visit

## 2024-11-17 ENCOUNTER — Encounter: Payer: Self-pay | Admitting: Physician Assistant

## 2024-11-17 VITALS — BP 140/94 | HR 73 | Ht 66.0 in | Wt 214.0 lb

## 2024-11-17 VITALS — BP 130/80 | Temp 98.5°F | Resp 16 | Ht 66.0 in | Wt 216.0 lb

## 2024-11-17 DIAGNOSIS — Z0000 Encounter for general adult medical examination without abnormal findings: Secondary | ICD-10-CM

## 2024-11-17 DIAGNOSIS — F419 Anxiety disorder, unspecified: Secondary | ICD-10-CM

## 2024-11-17 DIAGNOSIS — R079 Chest pain, unspecified: Secondary | ICD-10-CM

## 2024-11-17 DIAGNOSIS — E0590 Thyrotoxicosis, unspecified without thyrotoxic crisis or storm: Secondary | ICD-10-CM

## 2024-11-17 DIAGNOSIS — R072 Precordial pain: Secondary | ICD-10-CM

## 2024-11-17 DIAGNOSIS — E785 Hyperlipidemia, unspecified: Secondary | ICD-10-CM

## 2024-11-17 DIAGNOSIS — R002 Palpitations: Secondary | ICD-10-CM

## 2024-11-17 DIAGNOSIS — E66811 Obesity, class 1: Secondary | ICD-10-CM

## 2024-11-17 MED ORDER — LOSARTAN POTASSIUM 25 MG PO TABS: 25.0000 mg | ORAL_TABLET | Freq: Every day | ORAL | 3 refills | Status: AC

## 2024-11-17 NOTE — Patient Instructions (Addendum)
 A few things to remember from today's visit:  Routine general medical examination at a health care facility  Class 1 obesity with serious comorbidity and body mass index (BMI) of 31.0 to 31.9 in adult, unspecified obesity type - Plan: Amb Ref to Medical Weight Management  If you need refills for medications you take chronically, please call your pharmacy. Do not use My Chart to request refills or for acute issues that need immediate attention. If you send a my chart message, it may take a few days to be addressed, specially if I am not in the office.  Please be sure medication list is accurate. If a new problem present, please set up appointment sooner than planned today.  Health Maintenance, Female Adopting a healthy lifestyle and getting preventive care are important in promoting health and wellness. Ask your health care provider about: The right schedule for you to have regular tests and exams. Things you can do on your own to prevent diseases and keep yourself healthy. What should I know about diet, weight, and exercise? Eat a healthy diet  Eat a diet that includes plenty of vegetables, fruits, low-fat dairy products, and lean protein. Do not eat a lot of foods that are high in solid fats, added sugars, or sodium. Maintain a healthy weight Body mass index (BMI) is used to identify weight problems. It estimates body fat based on height and weight. Your health care provider can help determine your BMI and help you achieve or maintain a healthy weight. Get regular exercise Get regular exercise. This is one of the most important things you can do for your health. Most adults should: Exercise for at least 150 minutes each week. The exercise should increase your heart rate and make you sweat (moderate-intensity exercise). Do strengthening exercises at least twice a week. This is in addition to the moderate-intensity exercise. Spend less time sitting. Even light physical activity can be  beneficial. Watch cholesterol and blood lipids Have your blood tested for lipids and cholesterol at 40 years of age, then have this test every 5 years. Have your cholesterol levels checked more often if: Your lipid or cholesterol levels are high. You are older than 40 years of age. You are at high risk for heart disease. What should I know about cancer screening? Depending on your health history and family history, you may need to have cancer screening at various ages. This may include screening for: Breast cancer. Cervical cancer. Colorectal cancer. Skin cancer. Lung cancer. What should I know about heart disease, diabetes, and high blood pressure? Blood pressure and heart disease High blood pressure causes heart disease and increases the risk of stroke. This is more likely to develop in people who have high blood pressure readings or are overweight. Have your blood pressure checked: Every 3-5 years if you are 66-81 years of age. Every year if you are 61 years old or older. Diabetes Have regular diabetes screenings. This checks your fasting blood sugar level. Have the screening done: Once every three years after age 44 if you are at a normal weight and have a low risk for diabetes. More often and at a younger age if you are overweight or have a high risk for diabetes. What should I know about preventing infection? Hepatitis B If you have a higher risk for hepatitis B, you should be screened for this virus. Talk with your health care provider to find out if you are at risk for hepatitis B infection. Hepatitis C Testing is  recommended for: Everyone born from [birth year] through 1966. Anyone with known risk factors for hepatitis C. Sexually transmitted infections (STIs) Get screened for STIs, including gonorrhea and chlamydia, if: You are sexually active and are younger than 40 years of age. You are older than 40 years of age and your health care provider tells you that you are at risk for  this type of infection. Your sexual activity has changed since you were last screened, and you are at increased risk for chlamydia or gonorrhea. Ask your health care provider if you are at risk. Ask your health care provider about whether you are at high risk for HIV. Your health care provider may recommend a prescription medicine to help prevent HIV infection. If you choose to take medicine to prevent HIV, you should first get tested for HIV. You should then be tested every 3 months for as long as you are taking the medicine. Pregnancy If you are about to stop having your period (premenopausal) and you may become pregnant, seek counseling before you get pregnant. Take 400 to 800 micrograms (mcg) of folic acid every day if you become pregnant. Ask for birth control (contraception) if you want to prevent pregnancy. Osteoporosis and menopause Osteoporosis is a disease in which the bones lose minerals and strength with aging. This can result in bone fractures. If you are 77 years old or older, or if you are at risk for osteoporosis and fractures, ask your health care provider if you should: Be screened for bone loss. Take a calcium or vitamin D  supplement to lower your risk of fractures. Be given hormone replacement therapy (HRT) to treat symptoms of menopause. Follow these instructions at home: Alcohol use Do not drink alcohol if: Your health care provider tells you not to drink. You are pregnant, may be pregnant, or are planning to become pregnant. If you drink alcohol: Limit how much you have to: 0-1 drink a day. Know how much alcohol is in your drink. In the [place] one drink equals one 12 oz bottle of beer (355 mL), one 5 oz glass of wine (148 mL), or one 1 oz glass of hard liquor (44 mL). Lifestyle Do not use any products that contain nicotine or tobacco. These products include cigarettes, chewing tobacco, and vaping devices, such as e-cigarettes. If you need help quitting, ask your health  care provider. Do not use street drugs. Do not share needles. Ask your health care provider for help if you need support or information about quitting drugs. General instructions Schedule regular health, dental, and eye exams. Stay current with your vaccines. Tell your health care provider if: You often feel depressed. You have ever been abused or do not feel safe at home. This information is not intended to replace advice given to you by your health care provider. Make sure you discuss any questions you have with your health care provider. Document Revised: 04/05/2024 Document Reviewed: 02/17/2021 Elsevier Patient Education  2025 [employer].

## 2024-11-17 NOTE — Progress Notes (Unsigned)
 Enrolled patient for a 14 day Zio XT  monitor to be mailed to patients home

## 2024-11-17 NOTE — Progress Notes (Signed)
 "  Chief Complaint  Patient presents with   Annual Exam   Discussed the use of AI scribe software for clinical note transcription with the patient, who gave verbal consent to proceed. History of Present Illness Judith Moore is a 38 year old female with PMHx significant for anxiety, GERD, allergy rhinitis, hyperlipidemia, and hypothyroidism who presents for a routine follow-up visit. Last CPE: 09/28/23. She sees her gynecology regularly.  She was last seen on 10/20/2024, which she was concerned about CP and palpitations, she was evaluated by cardiologist today.  Cardiac workup was ordered.  She has been experiencing weight gain over the past five years, which she attributes to lifestyle factors such as working from home. She had a sleeve gastrectomy ten years ago and maintained her weight until recent years. She does not exercise regularly.  She cooks her meals and eats vegetables daily. She snacks on frozen fruit. She sleeps about eight hours per night.  She consumes one to two glasses of wine per week and occasionally smokes marijuana.  She does not smoke cigarettes.  She sees her eye care provider regularly and dentist.  Immunization History  Administered Date(s) Administered   Hep B, Unspecified 07/25/1996, 09/05/1996, 01/02/1997   MMR 05/22/2003   Meningococcal polysaccharide vaccine (MPSV4) 05/22/2003   PFIZER Comirnaty(Gray Top)Covid-19 Tri-Sucrose Vaccine 11/13/2020, 12/04/2020   Td 05/22/2003   Tdap 07/29/2020   Health Maintenance  Topic Date Due   Pneumococcal Vaccine (1 of 2 - PCV) Never done   COVID-19 Vaccine (3 - Pfizer risk series) 01/01/2021   Influenza Vaccine  Never done   Cervical Cancer Screening (HPV/Pap Cotest)  11/25/2026   DTaP/Tdap/Td (3 - Td or Tdap) 07/29/2030   HPV VACCINES (No Doses Required) Completed   Hepatitis C Screening  Completed   HIV Screening  Completed   Meningococcal B Vaccine  Aged Out   Hypertension: Her blood pressure at the  cardiologist's office was 143/94 mmHg.Currently she is on metoprolol  succinate 25 mg daily and today losartan  25 mg daily was added.  Lab Results  Component Value Date   NA 137 10/20/2024   CL 104 10/20/2024   K 3.7 10/20/2024   CO2 27 10/20/2024   BUN 7 10/20/2024   CREATININE 0.79 10/20/2024   GFR 94.31 10/20/2024   CALCIUM 8.8 10/20/2024   ALBUMIN 3.9 11/26/2021   GLUCOSE 84 10/20/2024   Lab Results  Component Value Date   CHOL 183 10/20/2024   HDL 70.90 10/20/2024   LDLCALC 98 10/20/2024   TRIG 70.0 10/20/2024   CHOLHDL 3 10/20/2024   Review of Systems  Constitutional:  Negative for activity change, appetite change and fever.  HENT:  Negative for mouth sores, sore throat and trouble swallowing.   Eyes:  Negative for redness and visual disturbance.  Respiratory:  Negative for cough, shortness of breath and wheezing.   Cardiovascular:  Negative for chest pain and leg swelling.  Gastrointestinal:  Negative for abdominal pain, nausea and vomiting.  Endocrine: Negative for cold intolerance, heat intolerance, polydipsia, polyphagia and polyuria.  Genitourinary:  Negative for decreased urine volume, dysuria and hematuria.  Musculoskeletal:  Negative for gait problem and myalgias.  Skin:  Negative for color change and rash.  Allergic/Immunologic: Positive for environmental allergies.  Neurological:  Negative for syncope, weakness and headaches.  Hematological:  Negative for adenopathy. Does not bruise/bleed easily.  Psychiatric/Behavioral:  Negative for confusion and hallucinations.   All other systems reviewed and are negative.  Current Outpatient Medications on File Prior  to Visit  Medication Sig Dispense Refill   clobetasol ointment (TEMOVATE) 0.05 % Apply to affected area in scalp 3-4 times per week. Not to face.     ibuprofen  (ADVIL ) 800 MG tablet Take 1 tablet (800 mg total) by mouth every 8 (eight) hours as needed. 30 tablet 3   losartan  (COZAAR ) 25 MG tablet Take 1  tablet (25 mg total) by mouth daily. 90 tablet 3   metoprolol  succinate (TOPROL -XL) 25 MG 24 hr tablet Take 1 tablet (25 mg total) by mouth daily. 90 tablet 3   drospirenone -ethinyl estradiol  (YAZ) 3-0.02 MG tablet Take 1 tablet by mouth daily. (Patient not taking: Reported on 11/17/2024) 84 tablet 3   Current Facility-Administered Medications on File Prior to Visit  Medication Dose Route Frequency Provider Last Rate Last Admin   levonorgestrel  (MIRENA ) 20 MCG/DAY IUD 1 each  1 each Intrauterine Once Erik Kieth BROCKS, MD        Past Medical History:  Diagnosis Date   Allergies    Anxiety    Gallstones    Hyperthyroidism     Past Surgical History:  Procedure Laterality Date   ADENOIDECTOMY     APPENDECTOMY  12/2015   CESAREAN SECTION  09/28/2005, 11/28/2007   CHOLECYSTECTOMY  04/2021   LAPAROSCOPIC APPENDECTOMY N/A 01/06/2016   Procedure: APPENDECTOMY LAPAROSCOPIC;  Surgeon: Donnice Lunger, MD;  Location: [facility];  Service: General;  Laterality: N/A;   stomach banding     TONSILLECTOMY     Allergies  Allergen Reactions   Almond (Diagnostic) Hives and Swelling    Almonds   Wellbutrin  [Bupropion ] Hives    Lip    Family History  Problem Relation Age of Onset   Cancer Paternal Grandfather    Diabetes Paternal Grandmother    Hypertension Maternal Grandmother    Hypertension Maternal Grandfather    Kidney failure Maternal Grandfather    Hypertension Mother    Thyroid  disease Mother        multinodular goiter   Healthy Brother        x1   Diabetes Maternal Uncle    Heart disease Neg Hx    Colon cancer Neg Hx     Social History   Socioeconomic History   Marital status: Divorced    Spouse name: SEPARATED   Number of children: 2   Years of education: 15+   Highest education level: Not on file  Occupational History   Occupation: HEALTH CARE BILLING SPECIALIST    Employer: [employer]  Tobacco Use   Smoking status: Former    Types: Cigars   Smokeless tobacco: Never    Tobacco comments:    Former cigar.  Vaping Use   Vaping status: Never Used  Substance and Sexual Activity   Alcohol use: Yes    Comment: occasional   Drug use: Yes    Types: Marijuana   Sexual activity: Yes    Partners: Male    Birth control/protection: None  Other Topics Concern   Not on file  Social History Narrative   Lives with her two sons, her uncle and cousin. Separated from husband 11/2010.   Social Drivers of Health   Tobacco Use: Medium Risk (11/17/2024)   Patient History    Smoking Tobacco Use: Former    Smokeless Tobacco Use: Never    Passive Exposure: Not on Actuary Strain: Not on file  Food Insecurity: Not on file  Transportation Needs: Not on file  Physical Activity: Not on file  Stress: Not on file  Social Connections: Not on file  Depression (PHQ2-9): Low Risk (11/17/2024)   Depression (PHQ2-9)    PHQ-2 Score: 1  Alcohol Screen: Not on file  Housing: Not on file  Utilities: Not on file  Health Literacy: Not on file   Today's Vitals   11/17/24 1145  BP: 130/80  Resp: 16  Temp: 98.5 F (36.9 C)  Weight: 216 lb (98 kg)  Height: 5' 6 (1.676 m)   Body mass index is 34.86 kg/m.  Wt Readings from Last 3 Encounters:  11/17/24 216 lb (98 kg)  11/17/24 214 lb 0.6 oz (97.1 kg)  10/20/24 214 lb (97.1 kg)   Physical Exam Vitals and nursing note reviewed.  Constitutional:      General: She is not in acute distress.    Appearance: She is well-developed.  HENT:     Head: Normocephalic and atraumatic.     Right Ear: Tympanic membrane, ear canal and external ear normal.     Left Ear: Tympanic membrane, ear canal and external ear normal.     Mouth/Throat:     Mouth: Mucous membranes are moist.     Pharynx: Oropharynx is clear. Uvula midline.  Eyes:     Extraocular Movements: Extraocular movements intact.     Conjunctiva/sclera: Conjunctivae normal.     Pupils: Pupils are equal, round, and reactive to light.  Neck:     Thyroid : No  thyromegaly (palpable).  Cardiovascular:     Rate and Rhythm: Normal rate and regular rhythm.     Pulses:          Dorsalis pedis pulses are 2+ on the right side and 2+ on the left side.     Heart sounds: No murmur heard. Pulmonary:     Effort: Pulmonary effort is normal. No respiratory distress.     Breath sounds: Normal breath sounds.  Abdominal:     Palpations: Abdomen is soft. There is no hepatomegaly or mass.     Tenderness: There is no abdominal tenderness.  Genitourinary:    Comments: Deferred to gyn. Musculoskeletal:     Right lower leg: No edema.     Left lower leg: No edema.     Comments: No major deformity or signs of synovitis appreciated.  Lymphadenopathy:     Cervical: No cervical adenopathy.     Upper Body:     Right upper body: No supraclavicular adenopathy.     Left upper body: No supraclavicular adenopathy.  Skin:    General: Skin is warm.     Findings: No erythema or rash.  Neurological:     General: No focal deficit present.     Mental Status: She is alert and oriented to person, place, and time.     Cranial Nerves: No cranial nerve deficit.     Sensory: No sensory deficit.     Motor: No weakness.     Coordination: Coordination normal.     Gait: Gait normal.     Deep Tendon Reflexes:     Reflex Scores:      Bicep reflexes are 2+ on the right side and 2+ on the left side.      Patellar reflexes are 2+ on the right side and 2+ on the left side. Psychiatric:        Mood and Affect: Mood and affect normal.   ASSESSMENT AND PLAN:  Judith Moore was here today for her annual physical examination.  Routine general medical examination at  a health care facility Assessment & Plan: We discussed the importance of regular physical activity and healthy diet for prevention of chronic illness and/or complications. Preventive guidelines reviewed. Vaccination up-to-date. Continue her female preventative care with gynecologist. Next CPE in a year.   Class  1 obesity with serious comorbidity and body mass index (BMI) of 31.0 to 31.9 in adult, unspecified obesity type Assessment & Plan: Gradually gaining weight for the past 5 years. 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 Consistency with healthy diet and physical activity encouraged. Referral to healthy weight and wellness clinic placed.  Orders: -     Amb Ref to Medical Weight Management  Return in 1 year (on 11/17/2025) for CPE, chronic problems.  Brylyn Novakovich G. Quincy Boy, MD  [facility]. [place] office. "

## 2024-11-17 NOTE — Assessment & Plan Note (Signed)
 Gradually gaining weight for the past 5 years. 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 Consistency with healthy diet and physical activity encouraged. Referral to healthy weight and wellness clinic placed.

## 2024-11-17 NOTE — Patient Instructions (Signed)
 Medication Instructions:  Your physician has recommended you make the following change in your medication:  START Losartan  25 mg taking 1 daily   *If you need a refill on your cardiac medications before your next appointment, please call your pharmacy*  Lab Work: 2 WEEKS GO TO [facility] FOR:  BMET  If you have labs (blood work) drawn today and your tests are completely normal, you will receive your results only by: MyChart Message (if you have MyChart) OR A paper copy in the mail If you have any lab test that is abnormal or we need to change your treatment, we will call you to review the results.  Testing/Procedures: Your physician has requested that you have an echocardiogram. Echocardiography is a painless test that uses sound waves to create images of your heart. It provides your doctor with information about the size and shape of your heart and how well your hearts chambers and valves are working. This procedure takes approximately one hour. There are no restrictions for this procedure. Please do NOT wear cologne, perfume, aftershave, or lotions (deodorant is allowed). Please arrive 15 minutes prior to your appointment time.  Please note: We ask at that you not bring children with you during ultrasound (echo/ vascular) testing. Due to room size and safety concerns, children are not allowed in the ultrasound rooms during exams. Our front office staff cannot provide observation of children in our lobby area while testing is being conducted. An adult accompanying a patient to their appointment will only be allowed in the ultrasound room at the discretion of the ultrasound technician under special circumstances. We apologize for any inconvenience.   Your physician has requested that you have cardiac CT. Cardiac computed tomography (CT) is a painless test that uses an x-ray machine to take clear, detailed pictures of your heart. For further information please visit [email or web address] Please  follow instruction BELOW:    Your cardiac CT will is scheduled at:   [facility]   Monday 2/23 arriving at 12:30 for 1pm   If scheduled at [facility], please arrive to the Heart and Vascular Center 15 mins early for check-in and test prep.   Please follow these instructions carefully (unless otherwise directed):  An IV will be required for this test and Nitroglycerin will be given.    On the Night Before the Test: Be sure to Drink plenty of water. Do not consume any caffeinated/decaffeinated beverages or chocolate 12 hours prior to your test. Do not take any antihistamines 12 hours prior to your test.   On the Day of the Test: Drink plenty of water until 1 hour prior to the test. Do not eat any food 1 hour prior to test. You may take your regular medications prior to the test.  Take metoprolol  (Lopressor ) two hours prior to test. FEMALES- please wear underwire-free bra if available, avoid dresses & tight clothing        After the Test: Drink plenty of water. After receiving IV contrast, you may experience a mild flushed feeling. This is normal. On occasion, you may experience a mild rash up to 24 hours after the test. This is not dangerous. If this occurs, you can take Benadryl  25 mg, Zyrtec , Claritin, or Allegra and increase your fluid intake. (Patients taking Tikosyn should avoid Benadryl , and may take Zyrtec , Claritin, or Allegra) If you experience trouble breathing, this can be serious. If it is severe call 911 IMMEDIATELY. If it is mild, please call our office.  We will call to schedule your test 2-4 weeks out understanding that some insurance companies will need an authorization prior to the service being performed.   For more information and frequently asked questions, please visit our website : [email or web address]  For non-scheduling related questions, please contact the cardiac imaging nurse navigator should you  have any questions/concerns: Cardiac Imaging Nurse Navigators Direct Office Dial: [phone]   For scheduling needs, including cancellations and rescheduling, please call Brittany, [phone].  For billing questions, please call [phone].   ZIO AT Long term monitor-Live Telemetry  Your physician has requested you wear a ZIO patch monitor for 14 DAYS.  This is a single patch monitor. [employer] supplies one patch monitor per enrollment. Additional  stickers are not available.  Please do not apply patch if you will be having a Nuclear Stress Test, Echocardiogram, Cardiac CT, MRI,  or Chest Xray during the period you would be wearing the monitor. The patch cannot be worn during  these tests. You cannot remove and re-apply the ZIO AT patch monitor.  Your ZIO patch monitor will be mailed 3 day USPS to your address on file. It may take 3-5 days to  receive your monitor after you have been enrolled.  Once you have received your monitor, please review the enclosed instructions. Your monitor has  already been registered assigning a specific monitor serial # to you.   Billing and Patient Assistance Program information  Meredeth has been supplied with any insurance information on record for billing. [employer] offers a sliding scale Patient Assistance Program for patients without insurance, or whose  insurance does not completely cover the cost of the ZIO patch monitor. You must apply for the  Patient Assistance Program to qualify for the discounted rate. To apply, call [employer] at [phone],  select option 4, select option 2 , ask to apply for the Patient Assistance Program, (you can request an  interpreter if needed). [employer] will ask your household income and how many people are in your  household. [employer] will quote your out-of-pocket cost based on this information. They will also be able  to set up a 12 month interest free payment plan if needed.  Applying the monitor   Shave hair from  upper left chest.  Hold the abrader disc by orange tab. Rub the abrader in 40 strokes over left upper chest as indicated in  your monitor instructions.  Clean area with 4 enclosed alcohol pads. Use all pads to ensure the area is cleaned thoroughly. Let  dry.  Apply patch as indicated in monitor instructions. Patch will be placed under collarbone on left side of  chest with arrow pointing upward.  Rub patch adhesive wings for 2 minutes. Remove the white label marked 1. Remove the white label  marked 2. Rub patch adhesive wings for 2 additional minutes.  While looking in a mirror, press and release button in center of patch. A small green light will flash 3-4  times. This will be your only indicator that the monitor has been turned on.  Do not shower for the first 24 hours. You may shower after the first 24 hours.  Press the button if you feel a symptom. You will hear a small click. Record Date, Time and Symptom in  the Patient Log.   Starting the Gateway  In your kit there is a audiological scientist box the size of a cellphone. This is [employer]. It transmits all your  recorded data to [facility]. This box must always  stay within 10 feet of you. Open the box and push the *  button. There will be a light that blinks orange and then green a few times. When the light stops  blinking, the Gateway is connected to the ZIO patch. Call [employer] at [phone] to confirm your monitor is transmitting.  Returning your monitor  Remove your patch and place it inside the Gateway. In the lower half of the Gateway there is a white  bag with prepaid postage on it. Place Gateway in bag and seal. Mail package back to [place] as soon as  possible. Your physician should have your final report approximately 7 days after you have mailed back  your monitor. Call [facility] Customer Care at [phone] if you have questions regarding your ZIO AT  patch monitor. Call them immediately if you see an orange  light blinking on your monitor.  If your monitor falls off in less than 4 days, contact our Monitor department at [phone]. If your  monitor becomes loose or falls off after 4 days call [employer] at [phone] for suggestions on  securing your monitor  Follow-Up: At [facility], you and your health needs are our priority.  As part of our continuing mission to provide you with exceptional heart care, our providers are all part of one team.  This team includes your primary Cardiologist (physician) and Advanced Practice Providers or APPs (Physician Assistants and Nurse Practitioners) who all work together to provide you with the care you need, when you need it.  Your next appointment:   8 week(s)  Provider:   Georganna Archer, MD    We recommend signing up for the patient portal called MyChart.  Sign up information is provided on this After Visit Summary.  MyChart is used to connect with patients for Virtual Visits (Telemedicine).  Patients are able to view lab/test results, encounter notes, upcoming appointments, etc.  Non-urgent messages can be sent to your provider as well.   To learn more about what you can do with MyChart, go to [email or web address].   Other Instructions

## 2024-11-17 NOTE — Assessment & Plan Note (Signed)
 We discussed the importance of regular physical activity and healthy diet for prevention of chronic illness and/or complications. Preventive guidelines reviewed. Vaccination up-to-date. Continue her female preventative care with gynecologist. Next CPE in a year.

## 2024-12-04 ENCOUNTER — Ambulatory Visit

## 2024-12-22 ENCOUNTER — Ambulatory Visit (HOSPITAL_COMMUNITY)

## 2025-01-03 ENCOUNTER — Ambulatory Visit: Admitting: Student in an Organized Health Care Education/Training Program

## 2025-02-01 ENCOUNTER — Ambulatory Visit: Payer: Commercial Managed Care - PPO | Admitting: Internal Medicine
